# Patient Record
Sex: Female | Born: 1935
Health system: Southern US, Community
[De-identification: ages and names within clinical notes are randomized; demographics above are authoritative.]

## PROBLEM LIST (undated history)

## (undated) DIAGNOSIS — Z9221 Personal history of antineoplastic chemotherapy: Secondary | ICD-10-CM

## (undated) DIAGNOSIS — G309 Alzheimer's disease, unspecified: Secondary | ICD-10-CM

## (undated) DIAGNOSIS — R7302 Impaired glucose tolerance (oral): Secondary | ICD-10-CM

## (undated) DIAGNOSIS — C801 Malignant (primary) neoplasm, unspecified: Secondary | ICD-10-CM

## (undated) DIAGNOSIS — I10 Essential (primary) hypertension: Secondary | ICD-10-CM

## (undated) DIAGNOSIS — E785 Hyperlipidemia, unspecified: Secondary | ICD-10-CM

## (undated) DIAGNOSIS — Z853 Personal history of malignant neoplasm of breast: Secondary | ICD-10-CM

## (undated) DIAGNOSIS — D649 Anemia, unspecified: Secondary | ICD-10-CM

## (undated) DIAGNOSIS — M1990 Unspecified osteoarthritis, unspecified site: Secondary | ICD-10-CM

## (undated) DIAGNOSIS — F0280 Dementia in other diseases classified elsewhere without behavioral disturbance: Secondary | ICD-10-CM

## (undated) DIAGNOSIS — Z95828 Presence of other vascular implants and grafts: Secondary | ICD-10-CM

## (undated) HISTORY — DX: Hyperlipidemia, unspecified: E78.5

## (undated) HISTORY — DX: Impaired glucose tolerance (oral): R73.02

## (undated) HISTORY — DX: Essential (primary) hypertension: I10

## (undated) HISTORY — DX: Anemia, unspecified: D64.9

## (undated) HISTORY — DX: Unspecified osteoarthritis, unspecified site: M19.90

## (undated) HISTORY — PX: PORTACATH PLACEMENT: SHX2246

## (undated) HISTORY — DX: Malignant (primary) neoplasm, unspecified: C80.1

## (undated) HISTORY — DX: Personal history of malignant neoplasm of breast: Z85.3

## (undated) HISTORY — DX: Presence of other vascular implants and grafts: Z95.828

## (undated) HISTORY — DX: Personal history of antineoplastic chemotherapy: Z92.21

---

## 1968-11-21 HISTORY — PX: ABDOMINAL HYSTERECTOMY: SHX81

## 2002-02-12 ENCOUNTER — Emergency Department (HOSPITAL_COMMUNITY): Admission: EM | Admit: 2002-02-12 | Discharge: 2002-02-12 | Payer: Self-pay | Admitting: Emergency Medicine

## 2002-11-21 HISTORY — PX: MASTECTOMY: SHX3

## 2003-03-22 DIAGNOSIS — C801 Malignant (primary) neoplasm, unspecified: Secondary | ICD-10-CM

## 2003-03-22 HISTORY — DX: Malignant (primary) neoplasm, unspecified: C80.1

## 2003-04-28 ENCOUNTER — Encounter: Payer: Self-pay | Admitting: *Deleted

## 2003-04-28 ENCOUNTER — Emergency Department (HOSPITAL_COMMUNITY): Admission: EM | Admit: 2003-04-28 | Discharge: 2003-04-28 | Payer: Self-pay | Admitting: *Deleted

## 2003-04-30 ENCOUNTER — Ambulatory Visit (HOSPITAL_COMMUNITY): Admission: RE | Admit: 2003-04-30 | Discharge: 2003-04-30 | Payer: Self-pay | Admitting: *Deleted

## 2003-04-30 ENCOUNTER — Encounter: Payer: Self-pay | Admitting: General Surgery

## 2003-05-04 ENCOUNTER — Encounter: Payer: Self-pay | Admitting: General Surgery

## 2003-05-04 ENCOUNTER — Inpatient Hospital Stay (HOSPITAL_COMMUNITY): Admission: AD | Admit: 2003-05-04 | Discharge: 2003-05-09 | Payer: Self-pay | Admitting: General Surgery

## 2003-05-05 ENCOUNTER — Encounter: Payer: Self-pay | Admitting: General Surgery

## 2003-05-06 ENCOUNTER — Encounter: Payer: Self-pay | Admitting: General Surgery

## 2003-05-06 HISTORY — PX: BREAST SURGERY: SHX581

## 2003-06-20 ENCOUNTER — Encounter (HOSPITAL_COMMUNITY): Admission: RE | Admit: 2003-06-20 | Discharge: 2003-07-20 | Payer: Self-pay | Admitting: Hematology and Oncology

## 2003-06-20 ENCOUNTER — Encounter: Admission: RE | Admit: 2003-06-20 | Discharge: 2003-06-20 | Payer: Self-pay | Admitting: Oncology

## 2003-06-23 ENCOUNTER — Encounter (HOSPITAL_COMMUNITY): Payer: Self-pay | Admitting: Oncology

## 2003-07-07 ENCOUNTER — Encounter (HOSPITAL_COMMUNITY): Payer: Self-pay | Admitting: Oncology

## 2003-07-30 ENCOUNTER — Encounter (HOSPITAL_COMMUNITY): Admission: RE | Admit: 2003-07-30 | Discharge: 2003-08-29 | Payer: Self-pay | Admitting: Oncology

## 2003-07-30 ENCOUNTER — Encounter: Admission: RE | Admit: 2003-07-30 | Discharge: 2003-07-30 | Payer: Self-pay | Admitting: Oncology

## 2003-08-22 ENCOUNTER — Observation Stay (HOSPITAL_COMMUNITY): Admission: AD | Admit: 2003-08-22 | Discharge: 2003-08-23 | Payer: Self-pay | Admitting: Family Medicine

## 2003-08-22 ENCOUNTER — Encounter: Payer: Self-pay | Admitting: Family Medicine

## 2003-09-01 ENCOUNTER — Encounter (HOSPITAL_COMMUNITY): Payer: Self-pay | Admitting: Oncology

## 2003-09-01 ENCOUNTER — Encounter (HOSPITAL_COMMUNITY): Admission: RE | Admit: 2003-09-01 | Discharge: 2003-10-01 | Payer: Self-pay | Admitting: Oncology

## 2003-10-02 ENCOUNTER — Encounter: Admission: RE | Admit: 2003-10-02 | Discharge: 2003-10-02 | Payer: Self-pay | Admitting: Oncology

## 2003-10-02 ENCOUNTER — Encounter (HOSPITAL_COMMUNITY): Admission: RE | Admit: 2003-10-02 | Discharge: 2003-11-01 | Payer: Self-pay | Admitting: Oncology

## 2003-11-05 ENCOUNTER — Encounter: Admission: RE | Admit: 2003-11-05 | Discharge: 2003-11-05 | Payer: Self-pay | Admitting: Oncology

## 2003-11-05 ENCOUNTER — Encounter (HOSPITAL_COMMUNITY): Admission: RE | Admit: 2003-11-05 | Discharge: 2003-12-05 | Payer: Self-pay | Admitting: Oncology

## 2003-12-17 ENCOUNTER — Encounter (HOSPITAL_COMMUNITY): Admission: RE | Admit: 2003-12-17 | Discharge: 2004-01-16 | Payer: Self-pay | Admitting: Oncology

## 2003-12-17 ENCOUNTER — Encounter: Admission: RE | Admit: 2003-12-17 | Discharge: 2003-12-17 | Payer: Self-pay | Admitting: Oncology

## 2004-01-12 ENCOUNTER — Ambulatory Visit (HOSPITAL_COMMUNITY): Admission: RE | Admit: 2004-01-12 | Discharge: 2004-01-12 | Payer: Self-pay | Admitting: Oncology

## 2004-01-26 ENCOUNTER — Encounter: Admission: RE | Admit: 2004-01-26 | Discharge: 2004-01-26 | Payer: Self-pay | Admitting: Oncology

## 2004-01-26 ENCOUNTER — Encounter (HOSPITAL_COMMUNITY): Admission: RE | Admit: 2004-01-26 | Discharge: 2004-02-25 | Payer: Self-pay | Admitting: Oncology

## 2004-01-27 ENCOUNTER — Ambulatory Visit: Admission: RE | Admit: 2004-01-27 | Discharge: 2004-04-26 | Payer: Self-pay | Admitting: *Deleted

## 2004-03-03 ENCOUNTER — Encounter (HOSPITAL_COMMUNITY): Admission: RE | Admit: 2004-03-03 | Discharge: 2004-04-02 | Payer: Self-pay | Admitting: Oncology

## 2004-03-03 ENCOUNTER — Encounter: Admission: RE | Admit: 2004-03-03 | Discharge: 2004-03-03 | Payer: Self-pay | Admitting: Oncology

## 2004-04-07 ENCOUNTER — Encounter: Admission: RE | Admit: 2004-04-07 | Discharge: 2004-04-07 | Payer: Self-pay | Admitting: Oncology

## 2004-04-07 ENCOUNTER — Encounter (HOSPITAL_COMMUNITY): Admission: RE | Admit: 2004-04-07 | Discharge: 2004-05-07 | Payer: Self-pay | Admitting: Oncology

## 2004-05-06 ENCOUNTER — Ambulatory Visit (HOSPITAL_COMMUNITY): Admission: RE | Admit: 2004-05-06 | Discharge: 2004-05-06 | Payer: Self-pay | Admitting: Oncology

## 2004-05-13 ENCOUNTER — Encounter (HOSPITAL_COMMUNITY): Admission: RE | Admit: 2004-05-13 | Discharge: 2004-06-12 | Payer: Self-pay | Admitting: Oncology

## 2004-05-13 ENCOUNTER — Encounter: Admission: RE | Admit: 2004-05-13 | Discharge: 2004-05-13 | Payer: Self-pay | Admitting: Oncology

## 2004-06-16 ENCOUNTER — Encounter: Admission: RE | Admit: 2004-06-16 | Discharge: 2004-06-16 | Payer: Self-pay | Admitting: Oncology

## 2004-06-16 ENCOUNTER — Encounter (HOSPITAL_COMMUNITY): Admission: RE | Admit: 2004-06-16 | Discharge: 2004-07-16 | Payer: Self-pay | Admitting: Oncology

## 2004-07-21 ENCOUNTER — Encounter (HOSPITAL_COMMUNITY): Admission: RE | Admit: 2004-07-21 | Discharge: 2004-08-20 | Payer: Self-pay | Admitting: Oncology

## 2004-07-21 ENCOUNTER — Encounter: Admission: RE | Admit: 2004-07-21 | Discharge: 2004-08-20 | Payer: Self-pay | Admitting: Oncology

## 2004-08-02 ENCOUNTER — Ambulatory Visit (HOSPITAL_COMMUNITY): Admission: RE | Admit: 2004-08-02 | Discharge: 2004-08-02 | Payer: Self-pay | Admitting: Oncology

## 2004-08-25 ENCOUNTER — Encounter: Admission: RE | Admit: 2004-08-25 | Discharge: 2004-08-25 | Payer: Self-pay | Admitting: Oncology

## 2004-08-25 ENCOUNTER — Encounter (HOSPITAL_COMMUNITY): Admission: RE | Admit: 2004-08-25 | Discharge: 2004-09-24 | Payer: Self-pay | Admitting: Oncology

## 2004-09-30 ENCOUNTER — Ambulatory Visit (HOSPITAL_COMMUNITY): Payer: Self-pay | Admitting: Oncology

## 2004-09-30 ENCOUNTER — Encounter: Admission: RE | Admit: 2004-09-30 | Discharge: 2004-09-30 | Payer: Self-pay | Admitting: Oncology

## 2004-09-30 ENCOUNTER — Encounter (HOSPITAL_COMMUNITY): Admission: RE | Admit: 2004-09-30 | Discharge: 2004-10-30 | Payer: Self-pay | Admitting: Oncology

## 2004-11-03 ENCOUNTER — Encounter (HOSPITAL_COMMUNITY): Admission: RE | Admit: 2004-11-03 | Discharge: 2004-12-03 | Payer: Self-pay | Admitting: Oncology

## 2004-11-03 ENCOUNTER — Encounter: Admission: RE | Admit: 2004-11-03 | Discharge: 2004-11-03 | Payer: Self-pay | Admitting: Oncology

## 2004-12-02 ENCOUNTER — Ambulatory Visit (HOSPITAL_COMMUNITY): Payer: Self-pay | Admitting: Oncology

## 2004-12-09 ENCOUNTER — Encounter (HOSPITAL_COMMUNITY): Admission: RE | Admit: 2004-12-09 | Discharge: 2005-01-08 | Payer: Self-pay | Admitting: Oncology

## 2004-12-09 ENCOUNTER — Encounter: Admission: RE | Admit: 2004-12-09 | Discharge: 2004-12-09 | Payer: Self-pay | Admitting: Oncology

## 2005-02-24 ENCOUNTER — Encounter (HOSPITAL_COMMUNITY): Admission: RE | Admit: 2005-02-24 | Discharge: 2005-03-26 | Payer: Self-pay | Admitting: Oncology

## 2005-02-24 ENCOUNTER — Ambulatory Visit (HOSPITAL_COMMUNITY): Payer: Self-pay | Admitting: Oncology

## 2005-02-24 ENCOUNTER — Encounter: Admission: RE | Admit: 2005-02-24 | Discharge: 2005-02-24 | Payer: Self-pay | Admitting: Oncology

## 2005-04-07 ENCOUNTER — Encounter: Admission: RE | Admit: 2005-04-07 | Discharge: 2005-04-07 | Payer: Self-pay | Admitting: Oncology

## 2005-04-07 ENCOUNTER — Encounter (HOSPITAL_COMMUNITY): Admission: RE | Admit: 2005-04-07 | Discharge: 2005-05-07 | Payer: Self-pay | Admitting: Oncology

## 2005-05-02 ENCOUNTER — Ambulatory Visit (HOSPITAL_COMMUNITY): Admission: RE | Admit: 2005-05-02 | Discharge: 2005-05-02 | Payer: Self-pay | Admitting: Oncology

## 2005-05-19 ENCOUNTER — Encounter: Admission: RE | Admit: 2005-05-19 | Discharge: 2005-05-19 | Payer: Self-pay | Admitting: Oncology

## 2005-05-19 ENCOUNTER — Ambulatory Visit (HOSPITAL_COMMUNITY): Payer: Self-pay | Admitting: Oncology

## 2005-05-19 ENCOUNTER — Encounter (HOSPITAL_COMMUNITY): Admission: RE | Admit: 2005-05-19 | Discharge: 2005-06-18 | Payer: Self-pay | Admitting: Oncology

## 2005-05-25 ENCOUNTER — Encounter (INDEPENDENT_AMBULATORY_CARE_PROVIDER_SITE_OTHER): Payer: Self-pay | Admitting: Diagnostic Radiology

## 2005-06-07 ENCOUNTER — Ambulatory Visit (HOSPITAL_COMMUNITY): Payer: Self-pay | Admitting: Oncology

## 2005-07-01 ENCOUNTER — Encounter: Admission: RE | Admit: 2005-07-01 | Discharge: 2005-07-01 | Payer: Self-pay | Admitting: Oncology

## 2005-07-01 ENCOUNTER — Encounter (HOSPITAL_COMMUNITY): Admission: RE | Admit: 2005-07-01 | Discharge: 2005-07-31 | Payer: Self-pay | Admitting: Oncology

## 2005-08-12 ENCOUNTER — Ambulatory Visit (HOSPITAL_COMMUNITY): Payer: Self-pay | Admitting: Oncology

## 2005-08-12 ENCOUNTER — Encounter: Admission: RE | Admit: 2005-08-12 | Discharge: 2005-08-20 | Payer: Self-pay | Admitting: Oncology

## 2005-08-12 ENCOUNTER — Encounter (HOSPITAL_COMMUNITY): Admission: RE | Admit: 2005-08-12 | Discharge: 2005-08-20 | Payer: Self-pay | Admitting: Oncology

## 2005-09-23 ENCOUNTER — Encounter: Admission: RE | Admit: 2005-09-23 | Discharge: 2005-09-23 | Payer: Self-pay | Admitting: Oncology

## 2005-09-23 ENCOUNTER — Encounter (HOSPITAL_COMMUNITY): Admission: RE | Admit: 2005-09-23 | Discharge: 2005-10-23 | Payer: Self-pay | Admitting: Oncology

## 2005-11-04 ENCOUNTER — Encounter: Admission: RE | Admit: 2005-11-04 | Discharge: 2005-11-04 | Payer: Self-pay | Admitting: Oncology

## 2005-11-04 ENCOUNTER — Encounter (HOSPITAL_COMMUNITY): Admission: RE | Admit: 2005-11-04 | Discharge: 2005-11-04 | Payer: Self-pay | Admitting: Oncology

## 2005-11-04 ENCOUNTER — Ambulatory Visit (HOSPITAL_COMMUNITY): Payer: Self-pay | Admitting: Oncology

## 2005-12-16 ENCOUNTER — Encounter: Admission: RE | Admit: 2005-12-16 | Discharge: 2005-12-16 | Payer: Self-pay | Admitting: Oncology

## 2005-12-16 ENCOUNTER — Encounter (HOSPITAL_COMMUNITY): Admission: RE | Admit: 2005-12-16 | Discharge: 2006-01-15 | Payer: Self-pay | Admitting: Oncology

## 2006-01-27 ENCOUNTER — Encounter (HOSPITAL_COMMUNITY): Admission: RE | Admit: 2006-01-27 | Discharge: 2006-02-26 | Payer: Self-pay | Admitting: Oncology

## 2006-01-27 ENCOUNTER — Ambulatory Visit (HOSPITAL_COMMUNITY): Payer: Self-pay | Admitting: Oncology

## 2006-01-27 ENCOUNTER — Encounter: Admission: RE | Admit: 2006-01-27 | Discharge: 2006-01-27 | Payer: Self-pay | Admitting: Oncology

## 2006-03-24 ENCOUNTER — Ambulatory Visit (HOSPITAL_COMMUNITY): Payer: Self-pay | Admitting: Oncology

## 2006-03-24 ENCOUNTER — Encounter: Admission: RE | Admit: 2006-03-24 | Discharge: 2006-03-24 | Payer: Self-pay | Admitting: Oncology

## 2006-03-24 ENCOUNTER — Encounter (HOSPITAL_COMMUNITY): Admission: RE | Admit: 2006-03-24 | Discharge: 2006-04-23 | Payer: Self-pay | Admitting: Oncology

## 2006-05-08 ENCOUNTER — Encounter: Admission: RE | Admit: 2006-05-08 | Discharge: 2006-05-08 | Payer: Self-pay | Admitting: Oncology

## 2006-05-08 ENCOUNTER — Encounter (HOSPITAL_COMMUNITY): Admission: RE | Admit: 2006-05-08 | Discharge: 2006-06-07 | Payer: Self-pay | Admitting: Oncology

## 2006-05-29 ENCOUNTER — Ambulatory Visit (HOSPITAL_COMMUNITY): Payer: Self-pay | Admitting: Oncology

## 2006-06-19 ENCOUNTER — Encounter (HOSPITAL_COMMUNITY): Admission: RE | Admit: 2006-06-19 | Discharge: 2006-07-19 | Payer: Self-pay | Admitting: Oncology

## 2006-06-19 ENCOUNTER — Encounter: Admission: RE | Admit: 2006-06-19 | Discharge: 2006-06-19 | Payer: Self-pay | Admitting: Oncology

## 2006-07-27 ENCOUNTER — Ambulatory Visit: Payer: Self-pay | Admitting: Family Medicine

## 2006-07-31 ENCOUNTER — Encounter: Admission: RE | Admit: 2006-07-31 | Discharge: 2006-08-18 | Payer: Self-pay | Admitting: Oncology

## 2006-07-31 ENCOUNTER — Ambulatory Visit (HOSPITAL_COMMUNITY): Payer: Self-pay | Admitting: Oncology

## 2006-07-31 ENCOUNTER — Encounter (HOSPITAL_COMMUNITY): Admission: RE | Admit: 2006-07-31 | Discharge: 2006-08-18 | Payer: Self-pay | Admitting: Oncology

## 2006-08-01 ENCOUNTER — Encounter (INDEPENDENT_AMBULATORY_CARE_PROVIDER_SITE_OTHER): Payer: Self-pay | Admitting: Family Medicine

## 2006-08-24 ENCOUNTER — Ambulatory Visit: Payer: Self-pay | Admitting: Family Medicine

## 2006-09-11 ENCOUNTER — Encounter: Admission: RE | Admit: 2006-09-11 | Discharge: 2006-09-11 | Payer: Self-pay | Admitting: Oncology

## 2006-09-11 ENCOUNTER — Encounter (HOSPITAL_COMMUNITY): Admission: RE | Admit: 2006-09-11 | Discharge: 2006-10-11 | Payer: Self-pay | Admitting: Oncology

## 2006-09-14 ENCOUNTER — Ambulatory Visit: Payer: Self-pay | Admitting: Family Medicine

## 2006-09-27 DIAGNOSIS — E785 Hyperlipidemia, unspecified: Secondary | ICD-10-CM

## 2006-09-27 DIAGNOSIS — J309 Allergic rhinitis, unspecified: Secondary | ICD-10-CM | POA: Insufficient documentation

## 2006-09-27 DIAGNOSIS — M1990 Unspecified osteoarthritis, unspecified site: Secondary | ICD-10-CM

## 2006-09-27 DIAGNOSIS — C50919 Malignant neoplasm of unspecified site of unspecified female breast: Secondary | ICD-10-CM

## 2006-09-27 DIAGNOSIS — I10 Essential (primary) hypertension: Secondary | ICD-10-CM | POA: Insufficient documentation

## 2006-10-19 ENCOUNTER — Ambulatory Visit: Payer: Self-pay | Admitting: Family Medicine

## 2006-10-23 ENCOUNTER — Ambulatory Visit (HOSPITAL_COMMUNITY): Payer: Self-pay | Admitting: Oncology

## 2006-11-27 ENCOUNTER — Encounter (HOSPITAL_COMMUNITY): Admission: RE | Admit: 2006-11-27 | Discharge: 2006-12-27 | Payer: Self-pay | Admitting: Oncology

## 2006-12-14 ENCOUNTER — Ambulatory Visit: Payer: Self-pay | Admitting: Family Medicine

## 2006-12-18 ENCOUNTER — Encounter (INDEPENDENT_AMBULATORY_CARE_PROVIDER_SITE_OTHER): Payer: Self-pay | Admitting: Family Medicine

## 2007-01-08 ENCOUNTER — Ambulatory Visit (HOSPITAL_COMMUNITY): Payer: Self-pay | Admitting: Oncology

## 2007-02-07 ENCOUNTER — Ambulatory Visit: Payer: Self-pay | Admitting: Family Medicine

## 2007-02-07 LAB — CONVERTED CEMR LAB: LDL Goal: 130 mg/dL

## 2007-02-08 ENCOUNTER — Encounter (INDEPENDENT_AMBULATORY_CARE_PROVIDER_SITE_OTHER): Payer: Self-pay | Admitting: Family Medicine

## 2007-02-12 LAB — CONVERTED CEMR LAB
ALT: 17 units/L (ref 0–35)
Albumin: 4.6 g/dL (ref 3.5–5.2)
Alkaline Phosphatase: 59 units/L (ref 39–117)
BUN: 19 mg/dL (ref 6–23)
Chloride: 102 meq/L (ref 96–112)
Cholesterol: 183 mg/dL (ref 0–200)
HDL: 70 mg/dL (ref >39)
Potassium: 3.7 meq/L (ref 3.5–5.3)
Total Bilirubin: 0.5 mg/dL (ref 0.3–1.2)
Total CHOL/HDL Ratio: 2.6
Triglycerides: 84 mg/dL (ref <150)
VLDL: 17 mg/dL (ref 0–40)

## 2007-02-20 ENCOUNTER — Encounter (HOSPITAL_COMMUNITY): Admission: RE | Admit: 2007-02-20 | Discharge: 2007-03-22 | Payer: Self-pay | Admitting: Oncology

## 2007-02-21 ENCOUNTER — Encounter (INDEPENDENT_AMBULATORY_CARE_PROVIDER_SITE_OTHER): Payer: Self-pay | Admitting: Family Medicine

## 2007-02-24 ENCOUNTER — Encounter (INDEPENDENT_AMBULATORY_CARE_PROVIDER_SITE_OTHER): Payer: Self-pay | Admitting: Family Medicine

## 2007-04-03 ENCOUNTER — Ambulatory Visit (HOSPITAL_COMMUNITY): Payer: Self-pay | Admitting: Oncology

## 2007-04-10 ENCOUNTER — Encounter (INDEPENDENT_AMBULATORY_CARE_PROVIDER_SITE_OTHER): Payer: Self-pay | Admitting: Family Medicine

## 2007-05-07 ENCOUNTER — Encounter (INDEPENDENT_AMBULATORY_CARE_PROVIDER_SITE_OTHER): Payer: Self-pay | Admitting: Family Medicine

## 2007-05-08 ENCOUNTER — Encounter (INDEPENDENT_AMBULATORY_CARE_PROVIDER_SITE_OTHER): Payer: Self-pay | Admitting: Family Medicine

## 2007-05-09 ENCOUNTER — Encounter (INDEPENDENT_AMBULATORY_CARE_PROVIDER_SITE_OTHER): Payer: Self-pay | Admitting: Family Medicine

## 2007-05-15 ENCOUNTER — Encounter (INDEPENDENT_AMBULATORY_CARE_PROVIDER_SITE_OTHER): Payer: Self-pay | Admitting: Family Medicine

## 2007-05-15 ENCOUNTER — Encounter (HOSPITAL_COMMUNITY): Admission: RE | Admit: 2007-05-15 | Discharge: 2007-06-14 | Payer: Self-pay | Admitting: Oncology

## 2007-05-28 ENCOUNTER — Ambulatory Visit (HOSPITAL_COMMUNITY): Payer: Self-pay | Admitting: Oncology

## 2007-05-28 ENCOUNTER — Encounter (INDEPENDENT_AMBULATORY_CARE_PROVIDER_SITE_OTHER): Payer: Self-pay | Admitting: Family Medicine

## 2007-06-26 ENCOUNTER — Encounter (HOSPITAL_COMMUNITY): Admission: RE | Admit: 2007-06-26 | Discharge: 2007-07-26 | Payer: Self-pay | Admitting: Oncology

## 2007-06-28 ENCOUNTER — Encounter (INDEPENDENT_AMBULATORY_CARE_PROVIDER_SITE_OTHER): Payer: Self-pay | Admitting: Family Medicine

## 2007-08-01 ENCOUNTER — Encounter (INDEPENDENT_AMBULATORY_CARE_PROVIDER_SITE_OTHER): Payer: Self-pay | Admitting: Internal Medicine

## 2007-08-01 ENCOUNTER — Ambulatory Visit: Payer: Self-pay | Admitting: Family Medicine

## 2007-08-03 ENCOUNTER — Encounter (INDEPENDENT_AMBULATORY_CARE_PROVIDER_SITE_OTHER): Payer: Self-pay | Admitting: Family Medicine

## 2007-08-03 LAB — CONVERTED CEMR LAB
AST: 22 units/L (ref 0–37)
Alkaline Phosphatase: 57 units/L (ref 39–117)
BUN: 18 mg/dL (ref 6–23)
CO2: 24 meq/L (ref 19–32)
Creatinine, Ser: 1.12 mg/dL (ref 0.40–1.20)
Hemoglobin: 11.5 g/dL — ABNORMAL LOW (ref 12.0–15.0)
Lymphocytes Relative: 28 % (ref 12–46)
MCHC: 32.3 g/dL (ref 30.0–36.0)
MCV: 79.8 fL (ref 78.0–100.0)
Monocytes Absolute: 0.6 10*3/uL (ref 0.2–0.7)
Neutro Abs: 5.6 10*3/uL (ref 1.7–7.7)
RDW: 13.9 % (ref 11.5–14.0)
Total Bilirubin: 0.4 mg/dL (ref 0.3–1.2)

## 2007-08-28 ENCOUNTER — Ambulatory Visit (HOSPITAL_COMMUNITY): Payer: Self-pay | Admitting: Oncology

## 2007-08-28 ENCOUNTER — Other Ambulatory Visit (HOSPITAL_COMMUNITY): Payer: Self-pay | Admitting: Oncology

## 2007-09-03 ENCOUNTER — Encounter (INDEPENDENT_AMBULATORY_CARE_PROVIDER_SITE_OTHER): Payer: Self-pay | Admitting: Family Medicine

## 2007-09-17 ENCOUNTER — Telehealth (INDEPENDENT_AMBULATORY_CARE_PROVIDER_SITE_OTHER): Payer: Self-pay | Admitting: *Deleted

## 2007-10-03 ENCOUNTER — Ambulatory Visit: Payer: Self-pay | Admitting: Family Medicine

## 2007-10-03 DIAGNOSIS — D649 Anemia, unspecified: Secondary | ICD-10-CM

## 2007-10-04 ENCOUNTER — Telehealth (INDEPENDENT_AMBULATORY_CARE_PROVIDER_SITE_OTHER): Payer: Self-pay | Admitting: *Deleted

## 2007-10-04 LAB — CONVERTED CEMR LAB
Iron: 67 ug/dL (ref 42–145)
TIBC: 349 ug/dL (ref 250–470)

## 2007-10-05 ENCOUNTER — Encounter (INDEPENDENT_AMBULATORY_CARE_PROVIDER_SITE_OTHER): Payer: Self-pay | Admitting: Family Medicine

## 2007-11-13 ENCOUNTER — Encounter (INDEPENDENT_AMBULATORY_CARE_PROVIDER_SITE_OTHER): Payer: Self-pay | Admitting: Family Medicine

## 2007-11-26 ENCOUNTER — Encounter (INDEPENDENT_AMBULATORY_CARE_PROVIDER_SITE_OTHER): Payer: Self-pay | Admitting: Family Medicine

## 2007-11-26 ENCOUNTER — Ambulatory Visit (HOSPITAL_COMMUNITY): Payer: Self-pay | Admitting: Oncology

## 2007-11-26 ENCOUNTER — Encounter (HOSPITAL_COMMUNITY): Admission: RE | Admit: 2007-11-26 | Discharge: 2007-12-26 | Payer: Self-pay | Admitting: Oncology

## 2007-11-29 ENCOUNTER — Encounter (INDEPENDENT_AMBULATORY_CARE_PROVIDER_SITE_OTHER): Payer: Self-pay | Admitting: Family Medicine

## 2007-12-24 ENCOUNTER — Encounter (INDEPENDENT_AMBULATORY_CARE_PROVIDER_SITE_OTHER): Payer: Self-pay | Admitting: Family Medicine

## 2008-01-22 ENCOUNTER — Encounter (INDEPENDENT_AMBULATORY_CARE_PROVIDER_SITE_OTHER): Payer: Self-pay | Admitting: Family Medicine

## 2008-01-25 ENCOUNTER — Encounter (INDEPENDENT_AMBULATORY_CARE_PROVIDER_SITE_OTHER): Payer: Self-pay | Admitting: Family Medicine

## 2008-01-29 ENCOUNTER — Ambulatory Visit: Payer: Self-pay | Admitting: Family Medicine

## 2008-01-29 ENCOUNTER — Ambulatory Visit (HOSPITAL_COMMUNITY): Payer: Self-pay | Admitting: Oncology

## 2008-01-29 ENCOUNTER — Encounter (HOSPITAL_COMMUNITY): Admission: RE | Admit: 2008-01-29 | Discharge: 2008-02-28 | Payer: Self-pay | Admitting: Oncology

## 2008-01-31 ENCOUNTER — Encounter (INDEPENDENT_AMBULATORY_CARE_PROVIDER_SITE_OTHER): Payer: Self-pay | Admitting: Family Medicine

## 2008-03-24 ENCOUNTER — Encounter (INDEPENDENT_AMBULATORY_CARE_PROVIDER_SITE_OTHER): Payer: Self-pay | Admitting: Family Medicine

## 2008-04-01 ENCOUNTER — Ambulatory Visit (HOSPITAL_COMMUNITY): Payer: Self-pay | Admitting: Oncology

## 2008-04-01 ENCOUNTER — Ambulatory Visit: Payer: Self-pay | Admitting: Family Medicine

## 2008-04-01 DIAGNOSIS — L259 Unspecified contact dermatitis, unspecified cause: Secondary | ICD-10-CM

## 2008-04-02 ENCOUNTER — Encounter (INDEPENDENT_AMBULATORY_CARE_PROVIDER_SITE_OTHER): Payer: Self-pay | Admitting: Family Medicine

## 2008-04-02 ENCOUNTER — Telehealth (INDEPENDENT_AMBULATORY_CARE_PROVIDER_SITE_OTHER): Payer: Self-pay | Admitting: *Deleted

## 2008-04-02 LAB — CONVERTED CEMR LAB
Alkaline Phosphatase: 58 units/L (ref 39–117)
Basophils Relative: 1 % (ref 0–1)
Calcium: 9.4 mg/dL (ref 8.4–10.5)
Chloride: 101 meq/L (ref 96–112)
Cholesterol: 215 mg/dL — ABNORMAL HIGH (ref 0–200)
Creatinine, Ser: 1.1 mg/dL (ref 0.40–1.20)
Eosinophils Relative: 2 % (ref 0–5)
Glucose, Bld: 102 mg/dL — ABNORMAL HIGH (ref 70–99)
HCT: 36.2 % (ref 36.0–46.0)
HDL: 64 mg/dL (ref >39)
MCHC: 31.8 g/dL (ref 30.0–36.0)
MCV: 81 fL (ref 78.0–100.0)
Monocytes Absolute: 0.5 10*3/uL (ref 0.1–1.0)
Monocytes Relative: 6 % (ref 3–12)
Neutrophils Relative %: 59 % (ref 43–77)
RBC: 4.47 M/uL (ref 3.87–5.11)
RDW: 15.1 % (ref 11.5–15.5)
Sodium: 140 meq/L (ref 135–145)
Total Protein: 7.6 g/dL (ref 6.0–8.3)
VLDL: 31 mg/dL (ref 0–40)
WBC: 8.2 10*3/uL (ref 4.0–10.5)

## 2008-04-17 ENCOUNTER — Ambulatory Visit: Payer: Self-pay | Admitting: Family Medicine

## 2008-04-17 LAB — CONVERTED CEMR LAB
Bilirubin Urine: NEGATIVE
Glucose, Urine, Semiquant: NEGATIVE
Ketones, urine, test strip: NEGATIVE
Urobilinogen, UA: 1
pH: 7

## 2008-04-21 ENCOUNTER — Telehealth (INDEPENDENT_AMBULATORY_CARE_PROVIDER_SITE_OTHER): Payer: Self-pay | Admitting: Internal Medicine

## 2008-04-23 ENCOUNTER — Ambulatory Visit: Payer: Self-pay | Admitting: Family Medicine

## 2008-04-23 ENCOUNTER — Encounter (INDEPENDENT_AMBULATORY_CARE_PROVIDER_SITE_OTHER): Payer: Self-pay | Admitting: Family Medicine

## 2008-04-23 ENCOUNTER — Other Ambulatory Visit: Admission: RE | Admit: 2008-04-23 | Discharge: 2008-04-23 | Payer: Self-pay | Admitting: Family Medicine

## 2008-04-23 ENCOUNTER — Emergency Department (HOSPITAL_COMMUNITY): Admission: EM | Admit: 2008-04-23 | Discharge: 2008-04-23 | Payer: Self-pay | Admitting: Emergency Medicine

## 2008-04-23 LAB — CONVERTED CEMR LAB
Basophils Relative: 0 % (ref 0–1)
Hemoglobin: 10.6 g/dL — ABNORMAL LOW (ref 12.0–15.0)
Lymphs Abs: 1.3 10*3/uL (ref 0.7–4.0)
MCHC: 33.8 g/dL (ref 30.0–36.0)
Monocytes Relative: 5 % (ref 3–12)
Nitrite: NEGATIVE
RBC: 3.91 M/uL (ref 3.87–5.11)
RDW: 14.2 % (ref 11.5–15.5)
WBC: 10.6 10*3/uL — ABNORMAL HIGH (ref 4.0–10.5)
pH: 6.5

## 2008-04-24 ENCOUNTER — Encounter (INDEPENDENT_AMBULATORY_CARE_PROVIDER_SITE_OTHER): Payer: Self-pay | Admitting: Family Medicine

## 2008-04-24 LAB — CONVERTED CEMR LAB
Chlamydia, DNA Probe: NEGATIVE
GC Probe Amp, Genital: NEGATIVE
Gardnerella vaginalis: POSITIVE — AB

## 2008-04-25 ENCOUNTER — Ambulatory Visit: Payer: Self-pay | Admitting: Family Medicine

## 2008-04-26 ENCOUNTER — Encounter (INDEPENDENT_AMBULATORY_CARE_PROVIDER_SITE_OTHER): Payer: Self-pay | Admitting: Internal Medicine

## 2008-04-26 ENCOUNTER — Observation Stay (HOSPITAL_COMMUNITY): Admission: EM | Admit: 2008-04-26 | Discharge: 2008-04-27 | Payer: Self-pay | Admitting: Emergency Medicine

## 2008-04-30 LAB — CONVERTED CEMR LAB
Iron: 78 ug/dL (ref 42–145)
TIBC: 327 ug/dL (ref 250–470)

## 2008-05-02 ENCOUNTER — Ambulatory Visit: Payer: Self-pay | Admitting: Family Medicine

## 2008-05-02 DIAGNOSIS — R011 Cardiac murmur, unspecified: Secondary | ICD-10-CM

## 2008-05-02 DIAGNOSIS — I6529 Occlusion and stenosis of unspecified carotid artery: Secondary | ICD-10-CM

## 2008-05-02 DIAGNOSIS — R55 Syncope and collapse: Secondary | ICD-10-CM

## 2008-05-02 DIAGNOSIS — A6000 Herpesviral infection of urogenital system, unspecified: Secondary | ICD-10-CM | POA: Insufficient documentation

## 2008-05-09 ENCOUNTER — Encounter (INDEPENDENT_AMBULATORY_CARE_PROVIDER_SITE_OTHER): Payer: Self-pay | Admitting: Family Medicine

## 2008-05-09 ENCOUNTER — Ambulatory Visit: Payer: Self-pay | Admitting: Internal Medicine

## 2008-05-09 ENCOUNTER — Ambulatory Visit (HOSPITAL_COMMUNITY): Admission: RE | Admit: 2008-05-09 | Discharge: 2008-05-09 | Payer: Self-pay | Admitting: Family Medicine

## 2008-05-26 ENCOUNTER — Ambulatory Visit: Payer: Self-pay | Admitting: Surgery

## 2008-06-13 ENCOUNTER — Encounter (INDEPENDENT_AMBULATORY_CARE_PROVIDER_SITE_OTHER): Payer: Self-pay | Admitting: Family Medicine

## 2008-06-19 ENCOUNTER — Encounter (INDEPENDENT_AMBULATORY_CARE_PROVIDER_SITE_OTHER): Payer: Self-pay | Admitting: Family Medicine

## 2008-06-19 ENCOUNTER — Encounter (HOSPITAL_COMMUNITY): Admission: RE | Admit: 2008-06-19 | Discharge: 2008-07-19 | Payer: Self-pay | Admitting: Oncology

## 2008-06-19 ENCOUNTER — Ambulatory Visit (HOSPITAL_COMMUNITY): Payer: Self-pay | Admitting: Oncology

## 2008-07-11 ENCOUNTER — Encounter (INDEPENDENT_AMBULATORY_CARE_PROVIDER_SITE_OTHER): Payer: Self-pay | Admitting: Family Medicine

## 2008-07-17 ENCOUNTER — Ambulatory Visit: Payer: Self-pay | Admitting: Family Medicine

## 2008-08-05 ENCOUNTER — Emergency Department (HOSPITAL_COMMUNITY): Admission: EM | Admit: 2008-08-05 | Discharge: 2008-08-05 | Payer: Self-pay | Admitting: Emergency Medicine

## 2008-08-15 ENCOUNTER — Ambulatory Visit: Payer: Self-pay | Admitting: Family Medicine

## 2008-08-15 ENCOUNTER — Ambulatory Visit (HOSPITAL_COMMUNITY): Payer: Self-pay | Admitting: Oncology

## 2008-08-18 ENCOUNTER — Encounter (INDEPENDENT_AMBULATORY_CARE_PROVIDER_SITE_OTHER): Payer: Self-pay | Admitting: Family Medicine

## 2008-08-19 LAB — CONVERTED CEMR LAB
ALT: 12 units/L (ref 0–35)
AST: 18 units/L (ref 0–37)
Albumin: 4.6 g/dL (ref 3.5–5.2)
CO2: 20 meq/L (ref 19–32)
Calcium: 9.3 mg/dL (ref 8.4–10.5)
Cholesterol: 215 mg/dL — ABNORMAL HIGH (ref 0–200)
HDL: 60 mg/dL (ref >39)
Potassium: 4.3 meq/L (ref 3.5–5.3)
Sodium: 140 meq/L (ref 135–145)
Total Bilirubin: 0.6 mg/dL (ref 0.3–1.2)
Total Protein: 7.6 g/dL (ref 6.0–8.3)
Triglycerides: 157 mg/dL — ABNORMAL HIGH (ref <150)

## 2008-08-21 ENCOUNTER — Ambulatory Visit: Payer: Self-pay | Admitting: Family Medicine

## 2008-09-01 ENCOUNTER — Telehealth (INDEPENDENT_AMBULATORY_CARE_PROVIDER_SITE_OTHER): Payer: Self-pay | Admitting: *Deleted

## 2008-09-03 ENCOUNTER — Ambulatory Visit: Payer: Self-pay | Admitting: Family Medicine

## 2008-09-06 ENCOUNTER — Encounter (INDEPENDENT_AMBULATORY_CARE_PROVIDER_SITE_OTHER): Payer: Self-pay | Admitting: Family Medicine

## 2008-09-16 ENCOUNTER — Ambulatory Visit: Payer: Self-pay | Admitting: Cardiology

## 2008-09-26 ENCOUNTER — Encounter (HOSPITAL_COMMUNITY): Admission: RE | Admit: 2008-09-26 | Discharge: 2008-10-26 | Payer: Self-pay | Admitting: Oncology

## 2008-09-26 ENCOUNTER — Ambulatory Visit: Payer: Self-pay | Admitting: Family Medicine

## 2008-09-27 ENCOUNTER — Encounter (INDEPENDENT_AMBULATORY_CARE_PROVIDER_SITE_OTHER): Payer: Self-pay | Admitting: Family Medicine

## 2008-09-29 LAB — CONVERTED CEMR LAB
Albumin: 4.8 g/dL (ref 3.5–5.2)
CO2: 22 meq/L (ref 19–32)
Calcium: 10.1 mg/dL (ref 8.4–10.5)
Glucose, Bld: 101 mg/dL — ABNORMAL HIGH (ref 70–99)
Potassium: 3.7 meq/L (ref 3.5–5.3)
Sodium: 138 meq/L (ref 135–145)
Total Protein: 8 g/dL (ref 6.0–8.3)

## 2008-11-07 ENCOUNTER — Ambulatory Visit (HOSPITAL_COMMUNITY): Payer: Self-pay | Admitting: Oncology

## 2008-12-12 ENCOUNTER — Encounter (INDEPENDENT_AMBULATORY_CARE_PROVIDER_SITE_OTHER): Payer: Self-pay | Admitting: Family Medicine

## 2009-01-12 ENCOUNTER — Encounter (INDEPENDENT_AMBULATORY_CARE_PROVIDER_SITE_OTHER): Payer: Self-pay | Admitting: Family Medicine

## 2009-01-23 ENCOUNTER — Ambulatory Visit (HOSPITAL_COMMUNITY): Payer: Self-pay | Admitting: Oncology

## 2009-01-30 ENCOUNTER — Ambulatory Visit: Payer: Self-pay | Admitting: Family Medicine

## 2009-01-30 ENCOUNTER — Telehealth (INDEPENDENT_AMBULATORY_CARE_PROVIDER_SITE_OTHER): Payer: Self-pay | Admitting: *Deleted

## 2009-02-02 ENCOUNTER — Encounter (INDEPENDENT_AMBULATORY_CARE_PROVIDER_SITE_OTHER): Payer: Self-pay | Admitting: *Deleted

## 2009-02-02 LAB — CONVERTED CEMR LAB
ALT: 12 units/L (ref 0–35)
AST: 19 units/L (ref 0–37)
Albumin: 4.4 g/dL (ref 3.5–5.2)
Alkaline Phosphatase: 48 units/L (ref 39–117)
Basophils Relative: 1 % (ref 0–1)
Chloride: 100 meq/L (ref 96–112)
Cholesterol: 200 mg/dL (ref 0–200)
Eosinophils Absolute: 0.1 10*3/uL (ref 0.0–0.7)
Glucose, Bld: 91 mg/dL (ref 70–99)
HCT: 36.2 % (ref 36.0–46.0)
Hemoglobin: 11.3 g/dL — ABNORMAL LOW (ref 12.0–15.0)
MCHC: 31.2 g/dL (ref 30.0–36.0)
Neutrophils Relative %: 53 % (ref 43–77)
Platelets: 341 10*3/uL (ref 150–400)
Potassium: 4 meq/L (ref 3.5–5.3)
RBC: 4.45 M/uL (ref 3.87–5.11)
Total Protein: 7.7 g/dL (ref 6.0–8.3)
Triglycerides: 150 mg/dL — ABNORMAL HIGH (ref <150)
WBC: 6 10*3/uL (ref 4.0–10.5)

## 2009-02-10 ENCOUNTER — Encounter (INDEPENDENT_AMBULATORY_CARE_PROVIDER_SITE_OTHER): Payer: Self-pay | Admitting: Family Medicine

## 2009-02-16 ENCOUNTER — Encounter (INDEPENDENT_AMBULATORY_CARE_PROVIDER_SITE_OTHER): Payer: Self-pay | Admitting: Family Medicine

## 2009-02-17 ENCOUNTER — Ambulatory Visit: Payer: Self-pay | Admitting: Family Medicine

## 2009-03-06 ENCOUNTER — Ambulatory Visit: Payer: Self-pay | Admitting: Family Medicine

## 2009-04-17 ENCOUNTER — Ambulatory Visit (HOSPITAL_COMMUNITY): Payer: Self-pay | Admitting: Oncology

## 2009-04-17 ENCOUNTER — Encounter (HOSPITAL_COMMUNITY): Admission: RE | Admit: 2009-04-17 | Discharge: 2009-05-17 | Payer: Self-pay | Admitting: Oncology

## 2009-06-26 ENCOUNTER — Encounter (INDEPENDENT_AMBULATORY_CARE_PROVIDER_SITE_OTHER): Payer: Self-pay | Admitting: Family Medicine

## 2009-06-26 ENCOUNTER — Ambulatory Visit (HOSPITAL_COMMUNITY): Payer: Self-pay | Admitting: Oncology

## 2009-06-26 ENCOUNTER — Encounter (HOSPITAL_COMMUNITY): Admission: RE | Admit: 2009-06-26 | Discharge: 2009-07-26 | Payer: Self-pay | Admitting: Oncology

## 2009-07-14 ENCOUNTER — Encounter (INDEPENDENT_AMBULATORY_CARE_PROVIDER_SITE_OTHER): Payer: Self-pay | Admitting: Family Medicine

## 2009-08-28 ENCOUNTER — Ambulatory Visit: Payer: Self-pay | Admitting: Family Medicine

## 2009-09-02 ENCOUNTER — Encounter: Payer: Self-pay | Admitting: Family Medicine

## 2009-09-04 LAB — CONVERTED CEMR LAB
Albumin: 4.7 g/dL (ref 3.5–5.2)
BUN: 21 mg/dL (ref 6–23)
CO2: 24 meq/L (ref 19–32)
Calcium: 9.9 mg/dL (ref 8.4–10.5)
Creatinine, Ser: 1.19 mg/dL (ref 0.40–1.20)
Glucose, Bld: 105 mg/dL — ABNORMAL HIGH (ref 70–99)
HDL: 59 mg/dL (ref >39)
Potassium: 4.4 meq/L (ref 3.5–5.3)
VLDL: 33 mg/dL (ref 0–40)

## 2009-10-09 ENCOUNTER — Encounter (HOSPITAL_COMMUNITY): Admission: RE | Admit: 2009-10-09 | Discharge: 2009-11-08 | Payer: Self-pay | Admitting: Oncology

## 2009-10-09 ENCOUNTER — Ambulatory Visit (HOSPITAL_COMMUNITY): Payer: Self-pay | Admitting: Oncology

## 2009-12-18 ENCOUNTER — Ambulatory Visit (HOSPITAL_COMMUNITY): Payer: Self-pay | Admitting: Oncology

## 2009-12-18 ENCOUNTER — Encounter (HOSPITAL_COMMUNITY): Admission: RE | Admit: 2009-12-18 | Discharge: 2010-01-17 | Payer: Self-pay | Admitting: Oncology

## 2010-01-01 ENCOUNTER — Ambulatory Visit: Payer: Self-pay | Admitting: Family Medicine

## 2010-01-01 DIAGNOSIS — R5383 Other fatigue: Secondary | ICD-10-CM

## 2010-01-01 DIAGNOSIS — R5381 Other malaise: Secondary | ICD-10-CM | POA: Insufficient documentation

## 2010-02-19 ENCOUNTER — Encounter: Payer: Self-pay | Admitting: Family Medicine

## 2010-02-19 ENCOUNTER — Ambulatory Visit (HOSPITAL_COMMUNITY): Payer: Self-pay | Admitting: Oncology

## 2010-04-10 ENCOUNTER — Emergency Department (HOSPITAL_COMMUNITY): Admission: EM | Admit: 2010-04-10 | Discharge: 2010-04-11 | Payer: Self-pay | Admitting: Emergency Medicine

## 2010-04-16 ENCOUNTER — Ambulatory Visit: Payer: Self-pay | Admitting: Family Medicine

## 2010-04-16 DIAGNOSIS — J45991 Cough variant asthma: Secondary | ICD-10-CM

## 2010-04-16 DIAGNOSIS — B360 Pityriasis versicolor: Secondary | ICD-10-CM | POA: Insufficient documentation

## 2010-04-20 LAB — CONVERTED CEMR LAB
ALT: 11 units/L (ref 0–35)
Alkaline Phosphatase: 39 units/L (ref 39–117)
BUN: 29 mg/dL — ABNORMAL HIGH (ref 6–23)
Cholesterol: 177 mg/dL (ref 0–200)
Creatinine, Ser: 1.51 mg/dL — ABNORMAL HIGH (ref 0.40–1.20)
Indirect Bilirubin: 0.5 mg/dL (ref 0.0–0.9)
LDL Cholesterol: 98 mg/dL (ref 0–99)
Total Protein: 7.5 g/dL (ref 6.0–8.3)
Triglycerides: 158 mg/dL — ABNORMAL HIGH (ref <150)

## 2010-05-14 ENCOUNTER — Ambulatory Visit (HOSPITAL_COMMUNITY): Payer: Self-pay | Admitting: Oncology

## 2010-06-17 ENCOUNTER — Telehealth (INDEPENDENT_AMBULATORY_CARE_PROVIDER_SITE_OTHER): Payer: Self-pay | Admitting: *Deleted

## 2010-06-22 ENCOUNTER — Ambulatory Visit: Payer: Self-pay | Admitting: Family Medicine

## 2010-08-06 ENCOUNTER — Ambulatory Visit (HOSPITAL_COMMUNITY): Payer: Self-pay | Admitting: Oncology

## 2010-08-27 ENCOUNTER — Ambulatory Visit: Payer: Self-pay | Admitting: Family Medicine

## 2010-08-30 ENCOUNTER — Encounter: Payer: Self-pay | Admitting: Family Medicine

## 2010-09-07 ENCOUNTER — Encounter: Payer: Self-pay | Admitting: Physician Assistant

## 2010-09-10 LAB — CONVERTED CEMR LAB
ALT: 18 units/L (ref 0–35)
BUN: 16 mg/dL (ref 6–23)
CO2: 25 meq/L (ref 19–32)
Cholesterol: 204 mg/dL — ABNORMAL HIGH (ref 0–200)
Creatinine, Ser: 1.18 mg/dL (ref 0.40–1.20)
HCT: 34.7 % — ABNORMAL LOW (ref 36.0–46.0)
HDL: 61 mg/dL (ref >39)
Hemoglobin: 11 g/dL — ABNORMAL LOW (ref 12.0–15.0)
MCV: 81.5 fL (ref 78.0–100.0)
Platelets: 299 10*3/uL (ref 150–400)
Total Bilirubin: 0.6 mg/dL (ref 0.3–1.2)
Total CHOL/HDL Ratio: 3.3
Triglycerides: 181 mg/dL — ABNORMAL HIGH (ref <150)
VLDL: 36 mg/dL (ref 0–40)
WBC: 9.2 10*3/uL (ref 4.0–10.5)

## 2010-10-28 ENCOUNTER — Encounter: Payer: Self-pay | Admitting: Family Medicine

## 2010-10-29 ENCOUNTER — Encounter (HOSPITAL_COMMUNITY)
Admission: RE | Admit: 2010-10-29 | Discharge: 2010-11-28 | Payer: Self-pay | Source: Home / Self Care | Attending: Oncology | Admitting: Oncology

## 2010-11-05 ENCOUNTER — Ambulatory Visit (HOSPITAL_COMMUNITY): Payer: Self-pay | Admitting: Oncology

## 2010-12-09 ENCOUNTER — Ambulatory Visit
Admission: RE | Admit: 2010-12-09 | Discharge: 2010-12-09 | Payer: Self-pay | Source: Home / Self Care | Attending: Family Medicine | Admitting: Family Medicine

## 2010-12-09 ENCOUNTER — Encounter: Payer: Self-pay | Admitting: Family Medicine

## 2010-12-12 ENCOUNTER — Encounter (HOSPITAL_COMMUNITY): Payer: Self-pay | Admitting: Oncology

## 2010-12-12 ENCOUNTER — Encounter: Payer: Self-pay | Admitting: Internal Medicine

## 2010-12-17 ENCOUNTER — Encounter (HOSPITAL_COMMUNITY)
Admission: RE | Admit: 2010-12-17 | Discharge: 2010-12-21 | Payer: Self-pay | Source: Home / Self Care | Attending: Oncology | Admitting: Oncology

## 2010-12-19 LAB — CONVERTED CEMR LAB
AST: 18 units/L (ref 0–37)
Bilirubin, Direct: 0.1 mg/dL (ref 0.0–0.3)
Blood Glucose, Fasting: 118 mg/dL
Ketones, urine, test strip: NEGATIVE
Nitrite: NEGATIVE
Specific Gravity, Urine: 1.015
Urobilinogen, UA: 0.2
pH: 6.5

## 2010-12-21 NOTE — Assessment & Plan Note (Signed)
Summary: office visit   Vital Signs:  Patient profile:   77 year old female Menstrual status:  hysterectomy Height:      63 inches Weight:      161.25 pounds BMI:     28.67 O2 Sat:      98 % Pulse rate:   89 / minute Pulse rhythm:   regular Resp:     16 per minute BP sitting:   130 / 62 Cuff size:   large  Vitals Entered By: Everitt Amber (January 01, 2010 9:19 AM)  Nutrition Counseling: Patient's BMI is greater than 25 and therefore counseled on weight management options. CC: Follow up chrobic problems Is Patient Diabetic? No   Primary Care Provider:  Syliva Overman MD  CC:  Follow up chrobic problems.  History of Present Illness: 1 week h/o head and chest congestion, no fever or chills, sputun and drainage is scant and clear. Reports  that she has generally been doingwell. Denies recent fever or chills. Denies sinus pressure, nasal congestion , ear pain or sore throat. Denies chest congestion, or cough productive of sputum. Denies chest pain, palpitations, PND, orthopnea or leg swelling. Denies abdominal pain, nausea, vomitting, diarrhea or constipation. Denies change in bowel movements or bloody stool. Denies dysuria , frequency, incontinence or hesitancy. Denies  joint pain, swelling, or reduced mobility. Denies headaches, vertigo, seizures. Denies depression, anxiety or insomnia. Denies  rash, lesions, or itch. Thye pt is still refusing on cancer screening at this time     Current Medications (verified): 1)  Aspirin 81 Mg Tbec (Aspirin) .Marland Kitchen.. 1 By Mouth Once Daily 2)  Centrum Silver  Tabs (Multiple Vitamins-Minerals) .... One Daily 3)  Crestor 20 Mg Tabs (Rosuvastatin Calcium) .... Take 1 Tab By Mouth At Bedtime 4)  Losartan Potassium-Hctz 100-25 Mg Tabs (Losartan Potassium-Hctz) .... Take 1 Tablet By Mouth Once A Day 5)  Amlodipine Besylate 10 Mg Tabs (Amlodipine Besylate) .... Take 1 Tablet By Mouth Once A Day 6)  Hydralazine Hcl 10 Mg Tabs (Hydralazine Hcl)  .... Take 1 Tablet By Mouth Once A Day  Allergies (verified): 1)  ! Nystop (Nystatin)  Review of Systems      See HPI General:  Complains of fatigue. Eyes:  Denies blurring and discharge. Endo:  Denies cold intolerance, excessive hunger, excessive thirst, excessive urination, heat intolerance, polyuria, and weight change. Heme:  Denies abnormal bruising and bleeding. Allergy:  Denies hives or rash and sneezing.  Physical Exam  General:  alert, well-hydrated, and overweight-appearing.  HEENT: No facial asymmetry,  EOMI, No sinus tenderness, TM's Clear, oropharynx  pink and moist.   Chest: Clear to auscultation bilaterally.  CVS: S1, S2, No murmurs, No S3.   Abd: Soft, Nontender.  MS: Adequate ROM spine, hips,  and knees. Decreased rOM left shoulder. Ext: No edema.   CNS: CN 2-12 intact, power tone and sensation normal throughout.   Skin: Intact, no visible lesions or rashes.  Psych: Good eye contact, normal affect.  Memory intact, not anxious or depressed appearing.     Impression & Recommendations:  Problem # 1:  FATIGUE (ICD-780.79) Assessment Comment Only  Orders: T-CBC w/Diff ([id number]) T-TSH ([id number])  Problem # 2:  OSTEOARTHRITIS (ICD-715.90) Assessment: Improved  The following medications were removed from the medication list:    Mobic 7.5 Mg/68ml Susp (Meloxicam) .Marland Kitchen... Take 1 tablet by mouth once a day as needed Her updated medication list for this problem includes:    Aspirin 81 Mg Tbec (Aspirin) .Marland Kitchen... 1 by  mouth once daily  Problem # 3:  HYPERTENSION (ICD-401.9) Assessment: Unchanged  Her updated medication list for this problem includes:    Losartan Potassium-hctz 100-25 Mg Tabs (Losartan potassium-hctz) .Marland Kitchen... Take 1 tablet by mouth once a day    Amlodipine Besylate 10 Mg Tabs (Amlodipine besylate) .Marland Kitchen... Take 1 tablet by mouth once a day    Hydralazine Hcl 10 Mg Tabs (Hydralazine hcl) .Marland Kitchen... Take 1 tablet by mouth once a day  Orders: T-Basic  Metabolic Panel [id number])  BP today: 130/62 Prior BP: 134/66 (08/28/2009)  Prior 10 Yr Risk Heart Disease: 9 % (03/06/2009)  Labs Reviewed: K+: 4.4 (08/28/2009) Creat: : 1.19 (08/28/2009)   Chol: 208 (08/28/2009)   HDL: 59 (08/28/2009)   LDL: 116 (08/28/2009)   TG: 164 (08/28/2009)  Problem # 4:  HYPERLIPIDEMIA (ICD-272.4) Assessment: Comment Only  Her updated medication list for this problem includes:    Crestor 20 Mg Tabs (Rosuvastatin calcium) .Marland Kitchen... Take 1 tab by mouth at bedtime  Orders: T-Hepatic Function [id number]) T-Lipid Profile [id number])  Labs Reviewed: SGOT: 20 (08/28/2009)   SGPT: 14 (08/28/2009)  Lipid Goals: Chol Goal: 200 (02/07/2007)   HDL Goal: 40 (02/07/2007)   LDL Goal: 100 (08/15/2008)   TG Goal: 150 (02/07/2007)  Prior 10 Yr Risk Heart Disease: 9 % (03/06/2009)   HDL:59 (08/28/2009), 56 (01/30/2009)  LDL:116 (08/28/2009), 114 (01/30/2009)  Chol:208 (08/28/2009), 200 (01/30/2009)  Trig:164 (08/28/2009), 150 (01/30/2009)  Complete Medication List: 1)  Aspirin 81 Mg Tbec (Aspirin) .Marland Kitchen.. 1 by mouth once daily 2)  Centrum Silver Tabs (Multiple vitamins-minerals) .... One daily 3)  Crestor 20 Mg Tabs (Rosuvastatin calcium) .... Take 1 tab by mouth at bedtime 4)  Losartan Potassium-hctz 100-25 Mg Tabs (Losartan potassium-hctz) .... Take 1 tablet by mouth once a day 5)  Amlodipine Besylate 10 Mg Tabs (Amlodipine besylate) .... Take 1 tablet by mouth once a day 6)  Hydralazine Hcl 10 Mg Tabs (Hydralazine hcl) .... Take 1 tablet by mouth once a day 7)  Tessalon Perles 100 Mg Caps (Benzonatate) .... Take 1 capsule by mouth three times a day  Patient Instructions: 1)  Please schedule a follow-up appointment in 4.5 months. 2)  You are being treated allergies causing sinusitis and bronchitis, meds are sent in. 3)  BMP prior to visit, ICD-9: 4)  Hepatic Panel prior to visit, ICD-9: 5)  Lipid Panel prior to visit, ICD-9: 6)  TSH prior to visit, ICD-9:    fasting in April 7)  CBC w/ Diff prior to visit, ICD-9: Prescriptions: TESSALON PERLES 100 MG CAPS (BENZONATATE) Take 1 capsule by mouth three times a day  #30 x 0   Entered and Authorized by:   Syliva Overman MD   Signed by:   Syliva Overman MD on 01/01/2010   Method used:   Electronically to        [facility]* (retail)       [address]       [place], [place]  [address]       Ph: [phone] or [phone]       Fax: [phone]   RxID:   [id number]

## 2010-12-21 NOTE — Progress Notes (Signed)
Summary: INJECTION  Phone Note Call from Patient   Summary of Call: Emma Dawson FOR SINSUS CALL BACK AT [phone] Initial call taken by: Lind Guest,  June 17, 2010 8:23 AM  Follow-up for Phone Call        please have patient schedule appt with dawn Follow-up by: Adella Hare LPN,  June 17, 2010 10:31 AM  Additional Follow-up for Phone Call Additional follow up Details #1::        CAN NOT COME IN UNTIL TUESDAY  Additional Follow-up by: Lind Guest,  June 17, 2010 1:48 PM

## 2010-12-21 NOTE — Assessment & Plan Note (Signed)
Summary: office visit   Vital Signs:  Patient profile:   77 year old female Menstrual status:  hysterectomy Height:      63 inches Weight:      154 pounds BMI:     27.38 O2 Sat:      97 % Pulse rate:   68 / minute Pulse rhythm:   regular Resp:     16 per minute BP sitting:   136 / 64  (left arm) Cuff size:   regular  Vitals Entered By: Everitt Amber LPN (Apr 16, 2010 7:59 AM)  Nutrition Counseling: Patient's BMI is greater than 25 and therefore counseled on weight management options. CC: Follow up, went to er saturday night for cough and wheezing. Chest xray showed clear lungs. Was given an inhaler   Primary Care Provider:  Syliva Overman MD  CC:  Follow up and went to er saturday night for cough and wheezing. Chest xray showed clear lungs. Was given an inhaler.  History of Present Illness: Ptrecently trearted in the ed for cough variant asthma, staes she is doing well using albuteol as needed, reports Spring allergies chronically. She deneies recentor current fever or chils. her nsal drainage is clear and she has no sputum when she coughs. she deneies depression or anxiety. Shedenies chest pain , plpitations, leg edema , pND or rorthopnea. She still refuses screening tests for cancer. he keeps active in her garden and explains hwer weight loss by the fact that she remains active and primarily eats veg and fruit She denies constipation, abd pain , visible rectal blood or a change in BM.  Current Medications (verified): 1)  Aspirin 81 Mg Tbec (Aspirin) .Marland Kitchen.. 1 By Mouth Once Daily 2)  Centrum Silver  Tabs (Multiple Vitamins-Minerals) .... One Daily 3)  Crestor 20 Mg Tabs (Rosuvastatin Calcium) .... Take 1 Tab By Mouth At Bedtime 4)  Losartan Potassium-Hctz 100-25 Mg Tabs (Losartan Potassium-Hctz) .... Take 1 Tablet By Mouth Once A Day 5)  Amlodipine Besylate 10 Mg Tabs (Amlodipine Besylate) .... Take 1 Tablet By Mouth Once A Day 6)  Hydralazine Hcl 10 Mg Tabs (Hydralazine Hcl)  .... Take 1 Tablet By Mouth Once A Day 7)  Proair Hfa 108 (90 Base) Mcg/act Aers (Albuterol Sulfate) .... 2 Puffs Every 4 Hrs  Allergies (verified): 1)  ! 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 Eyes:  Denies blurring and discharge. Resp:  Complains of cough, shortness of breath, and wheezing; denies sputum productive; cvoughing primarily at night, no fever or chills or sputum , got proair in the Ed uses at night and is better. GU:  Denies dysuria, incontinence, urinary frequency, and urinary hesitancy. MS:  Complains of joint pain and stiffness; denies low back pain, mid back pain, muscle, and muscle weakness. Derm:  Complains of itching and rash; puritic rash on right leg x 4 months, olive oil only. Neuro:  Denies headaches, numbness, seizures, and sensation of room spinning. Psych:  Denies anxiety and depression. Endo:  Denies excessive thirst, excessive urination, and heat intolerance. Heme:  Denies abnormal bruising and bleeding. Allergy:  Complains  of seasonal allergies.  Physical Exam  General:  alert, well-hydrated, and overweight-appearing.  HEENT: No facial asymmetry,  EOMI, No sinus tenderness, TM's Clear, oropharynx  pink and moist. Poor dentition.  Chest: Clear to auscultation bilaterally. Adequate air entry throughout. CVS: S1, S2, No murmurs, No S3.   Abd: Soft, Nontender.  MS: Adequate ROM spine, hips,   knees and shoulders.  Ext: No edema.   CNS: CN 2-12 intact, power tone  and sensation normal throughout.   Skin: Intact, tinea versicolor right leg.  Psych: Good eye contact, normal affect.  Memory intact, not anxious or depressed appearing.     Impression & Recommendations:  Problem # 1:  COUGH VARIANT ASTHMA (ICD-493.82) Assessment Improved  Her updated medication list for this problem includes:    Proair Hfa 108 (90 Base) Mcg/act Aers (Albuterol sulfate) .Marland Kitchen... 2 puffs every 4 hrs  Problem # 2:  HYPERTENSION (ICD-401.9) Assessment: Unchanged  Her updated medication list for this problem includes:    Losartan Potassium-hctz 100-25 Mg Tabs (Losartan potassium-hctz) .Marland Kitchen... Take 1 tablet by mouth once a day    Amlodipine Besylate 10 Mg Tabs (Amlodipine besylate) .Marland Kitchen... Take 1 tablet by mouth once a day    Hydralazine Hcl 10 Mg Tabs (Hydralazine hcl) .Marland Kitchen... Take 1 tablet by mouth once a day  Orders: T-Basic Metabolic Panel [id number])  BP today: 136/64 Prior BP: 130/62 (01/01/2010)  Prior 10 Yr Risk Heart Disease: 9 % (03/06/2009)  Labs Reviewed: K+: 4.4 (08/28/2009) Creat: : 1.19 (08/28/2009)   Chol: 208 (08/28/2009)   HDL: 59 (08/28/2009)   LDL: 116 (08/28/2009)   TG: 164 (08/28/2009)  Problem # 3:  HYPERLIPIDEMIA (ICD-272.4) Assessment: Comment Only  Her updated medication list for this problem includes:    Crestor 20 Mg Tabs (Rosuvastatin calcium) .Marland Kitchen... Take 1 tab by mouth at bedtime  Orders: T-Hepatic Function [id number]) T-Lipid Profile [id number])  Labs Reviewed: SGOT: 20 (08/28/2009)   SGPT: 14 (08/28/2009)  Lipid Goals: Chol Goal: 200 (02/07/2007)   HDL Goal: 40 (02/07/2007)   LDL Goal: 100 (08/15/2008)   TG Goal: 150 (02/07/2007)  Prior 10 Yr Risk Heart Disease: 9 % (03/06/2009)   HDL:59 (08/28/2009), 56 (01/30/2009)  LDL:116 (08/28/2009), 114 (01/30/2009)  Chol:208 (08/28/2009), 200 (01/30/2009)  Trig:164 (08/28/2009), 150 (01/30/2009)  Problem # 4:  TINEA VERSICOLOR (ICD-111.0) Assessment: Comment Only  Her updated  medication list for this problem includes:    Clotrimazole-betamethasone 1-0.05 % Crea (Clotrimazole-betamethasone) .Marland Kitchen... Apply twice daily to rash on right  leg for 10 days , then as needed  Complete Medication List: 1)  Aspirin 81 Mg Tbec (Aspirin) .Marland Kitchen.. 1 by mouth once daily 2)  Centrum Silver Tabs (Multiple vitamins-minerals) .... One daily 3)  Crestor 20 Mg Tabs (Rosuvastatin calcium) .... Take 1 tab by mouth at bedtime 4)  Losartan Potassium-hctz 100-25 Mg Tabs (Losartan potassium-hctz) .... Take 1 tablet by mouth once a day 5)  Amlodipine Besylate 10 Mg Tabs (Amlodipine besylate) .... Take 1 tablet by mouth once a day 6)  Hydralazine Hcl 10 Mg Tabs (Hydralazine hcl) .... Take 1 tablet by mouth once a day 7)  Proair Hfa 108 (90 Base) Mcg/act Aers (Albuterol sulfate) .... 2 puffs every 4 hrs 8)  Clotrimazole-betamethasone 1-0.05 % Crea (Clotrimazole-betamethasone) .... Apply twice daily to rash on right  leg for 10 days , then as needed  Other Orders: T-TSH [id number]) T-Vitamin D (25-Hydroxy) [id number])  Patient Instructions: 1)  Please schedule a follow-up appointment in 4 months. 2)  It is  important that you exercise regularly at least 20 minutes 5 times a week. If you develop chest pain, have severe difficulty breathing, or feel very tired , stop exercising immediately and seek medical attention. 3)  BMP prior to visit, ICD-9: 4)  Hepatic Panel prior to visit, ICD-9: 5)  Lipid Panel prior to visit, ICD-9: fasting this morning. 6)  TSH prior to visit, ICD-9: 7)  CBC w/ Diff prior to visit, ICD-9: 8)  Vitamin D 9)  Med is sent in for the rash on your leg Prescriptions: CLOTRIMAZOLE-BETAMETHASONE 1-0.05 % CREA (CLOTRIMAZOLE-BETAMETHASONE) apply twice daily to rash on right  leg for 10 days , then as needed  #45 gm x 0   Entered and Authorized by:   Syliva Overman MD   Signed by:   Syliva Overman MD on 04/16/2010   Method used:   Electronically to        [facility]* (retail)       [address]       [place], [place]  [address]       Ph: [phone] or [phone]       Fax: [phone]   RxID:   [id number]

## 2010-12-21 NOTE — Letter (Signed)
Summary: Letter to [facility] appt  Letter to [facility] appt   Imported By: Lind Guest 10/28/2010 12:44:41  _____________________________________________________________________  External Attachment:    Type:   Image     Comment:   External Document

## 2010-12-21 NOTE — Assessment & Plan Note (Signed)
Summary: OFFICE VISIT   Vital Signs:  Patient profile:   77 year old female Menstrual status:  hysterectomy Height:      63 inches Weight:      153.50 pounds BMI:     27.29 O2 Sat:      99 % Pulse rate:   83 / minute Resp:     16 per minute BP sitting:   140 / 60  (left arm)  Vitals Entered By: Everitt Amber LPN (August 27, 2010 9:49 AM) CC: Patient c/o sinus congestion, clear drainage, no other symptoms   Primary Provider:  Syliva Overman MD  CC:  Patient c/o sinus congestion, clear drainage, and no other symptoms.  History of Present Illness: Pt presents today for check up.  Overall she is feeling well. Hx of htn. Taking medications as prescribed and denies side effects.  No headache, chest pain or palpitations. Hx of Hyperlipidemia.  Taking medications as prescribed and no side effects.  Following a low fat diet. Not exercising regularly. Hx of asthma.  Breathing has been doing well.  No cough.  Pt states she has had some clear nasal congestion but the allergy pill she takes heps with this. Pt states she got her Flu vaccine yesterday.     Allergies: 1)  ! Nystop (Nystatin)  Past History:  Past medical history reviewed for relevance to current acute and chronic problems.  Past Medical History: Reviewed history from 04/16/2010 and no changes required. 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 ENT:  Complains of nasal congestion; denies earache, sinus pressure, and sore throat. CV:  Denies chest pain or discomfort, lightheadness, and palpitations. Resp:  Denies cough and shortness  of breath. GI:  Denies abdominal pain, change in bowel habits, nausea, and vomiting. Allergy:  Complains of seasonal allergies and sneezing.  Physical Exam  General:  Well-developed,well-nourished,in no acute distress; alert,appropriate and cooperative throughout examination Head:  Normocephalic and atraumatic without obvious abnormalities. No apparent alopecia or balding. Ears:  External ear exam shows no significant lesions or deformities.  Otoscopic examination reveals clear canals, tympanic membranes are intact bilaterally without bulging, retraction, inflammation or discharge. Hearing is grossly normal bilaterally. Nose:  External nasal examination shows no deformity or inflammation. Nasal mucosa are pink and moist without lesions or exudates. Mouth:  Oral mucosa and oropharynx without lesions or exudates.  . Neck:  No deformities, masses, or tenderness noted. Lungs:  Normal respiratory effort, chest expands symmetrically. Lungs are clear to auscultation, no crackles or wheezes. Heart:  Normal rate and regular rhythm. S1 and S2 normal without gallop, murmur, click, rub or other extra sounds. Cervical Nodes:  No lymphadenopathy noted Psych:  Cognition and judgment appear intact. Alert and cooperative with normal attention span and concentration. No apparent delusions, illusions, hallucinations   Impression & Recommendations:  Problem # 1:  HYPERTENSION (ICD-401.9) Assessment Comment Only  Her updated medication list for this problem includes:    Losartan Potassium-hctz 100-25 Mg Tabs (Losartan potassium-hctz) .Marland Kitchen... Take 1 tablet by mouth once a day    Amlodipine Besylate 10 Mg Tabs (Amlodipine besylate) .Marland Kitchen... Take 1 tablet by mouth once a day    Hydralazine Hcl 10 Mg Tabs (Hydralazine hcl) .Marland KitchenMarland KitchenMarland KitchenMarland Kitchen  Take 1 tablet by mouth once a day  BP today: 140/60 Prior BP: 130/60 (06/22/2010)  Prior 10 Yr Risk Heart Disease: 9 % (03/06/2009)  Labs Reviewed: K+: 3.8 (04/16/2010) Creat: : 1.51  (04/16/2010)   Chol: 177 (04/16/2010)   HDL: 47 (04/16/2010)   LDL: 98 (04/16/2010)   TG: 158 (04/16/2010)  Problem # 2:  HYPERLIPIDEMIA (ICD-272.4) Assessment: Comment Only  Her updated medication list for this problem includes:    Crestor 20 Mg Tabs (Rosuvastatin calcium) .Marland Kitchen... Take 1 tab by mouth at bedtime  Labs Reviewed: SGOT: 20 (04/16/2010)   SGPT: 11 (04/16/2010)  Lipid Goals: Chol Goal: 200 (02/07/2007)   HDL Goal: 40 (02/07/2007)   LDL Goal: 100 (08/15/2008)   TG Goal: 150 (02/07/2007)  Prior 10 Yr Risk Heart Disease: 9 % (03/06/2009)   HDL:47 (04/16/2010), 59 (08/28/2009)  LDL:98 (04/16/2010), 116 (04/54/0981)  Chol:177 (04/16/2010), 208 (08/28/2009)  Trig:158 (04/16/2010), 164 (08/28/2009)  Problem # 3:  ALLERGIC RHINITIS (ICD-477.9) Assessment: Comment Only  Problem # 4:  COUGH VARIANT ASTHMA (ICD-493.82) Assessment: Comment Only  Her updated medication list for this problem includes:    Proair Hfa 108 (90 Base) Mcg/act Aers (Albuterol sulfate) .Marland Kitchen... 2 puffs every 4 hrs  Complete Medication List: 1)  Aspirin 81 Mg Tbec (Aspirin) .Marland Kitchen.. 1 by mouth once daily 2)  Centrum Silver Tabs (Multiple vitamins-minerals) .... One daily 3)  Crestor 20 Mg Tabs (Rosuvastatin calcium) .... Take 1 tab by mouth at bedtime 4)  Losartan Potassium-hctz 100-25 Mg Tabs (Losartan potassium-hctz) .... Take 1 tablet by mouth once a day 5)  Amlodipine Besylate 10 Mg Tabs (Amlodipine besylate) .... Take 1 tablet by mouth once a day 6)  Hydralazine Hcl 10 Mg Tabs (Hydralazine hcl) .... Take 1 tablet by mouth once a day 7)  Proair Hfa 108 (90 Base) Mcg/act Aers (Albuterol sulfate) .... 2 puffs every 4 hrs 8)  Clotrimazole-betamethasone 1-0.05 % Crea (Clotrimazole-betamethasone) .... Apply twice daily to rash on right  leg for 10 days , then as needed 9)  Vitamin D (ergocalciferol) 50000 Unit Caps (Ergocalciferol) .... One tab once a week  Other Orders: T-Comprehensive Metabolic Panel  ([id number]) T-Lipid Profile ([id number]) T-CBC No Diff ([id number])  Patient Instructions: 1)  Please schedule a follow-up appointment in 3 months. 2)  Have your blood work drawn fasting. 3)  Continue your current medications.

## 2010-12-21 NOTE — Progress Notes (Signed)
Summary: [facility]  [facility]   Imported By: Lind Guest 02/26/2010 16:28:19  _____________________________________________________________________  External Attachment:    Type:   Image     Comment:   External Document

## 2010-12-21 NOTE — Assessment & Plan Note (Signed)
Summary: cough- room 1   Vital Signs:  Patient profile:   77 year old female Menstrual status:  hysterectomy Height:      63 inches Weight:      153.75 pounds BMI:     27.33 O2 Sat:      98 % on Room air Pulse rate:   99 / minute Resp:     16 per minute BP sitting:   130 / 60  (left arm)  Vitals Entered By: Adella Hare LPN (June 22, 2010 8:23 AM) CC: productive cough, clear sputum Is Patient Diabetic? No Pain Assessment Patient in pain? no        Primary Provider:  Syliva Overman MD  CC:  productive cough and clear sputum.  History of Present Illness: Pt presents today with c/o cough off & on since Feb.  The cough is sometimes prod with clear phlegm.  No HS awakening.  No difficulty breathing.  Cough mostly occurs with exposure to cigarette smoke, perfumes and other strong smells.  Also with allergens.  Pt is requesting an injection today to see if this would help.  She uses cough gtts as needed, and these do help some.  Pt denieds fever, chill, or nasal congestion. CXR 04-10-10 @ [facility] Neg.  Current Medications (verified): 1)  Aspirin 81 Mg Tbec (Aspirin) .Marland Kitchen.. 1 By Mouth Once Daily 2)  Centrum Silver  Tabs (Multiple Vitamins-Minerals) .... One Daily 3)  Crestor 20 Mg Tabs (Rosuvastatin Calcium) .... Take 1 Tab By Mouth At Bedtime 4)  Losartan Potassium-Hctz 100-25 Mg Tabs (Losartan Potassium-Hctz) .... Take 1 Tablet By Mouth Once A Day 5)  Amlodipine Besylate 10 Mg Tabs (Amlodipine Besylate) .... Take 1 Tablet By Mouth Once A Day 6)  Hydralazine Hcl 10 Mg Tabs (Hydralazine Hcl) .... Take 1 Tablet By Mouth Once A Day 7)  Proair Hfa 108 (90 Base) Mcg/act Aers (Albuterol Sulfate) .... 2 Puffs Every 4 Hrs 8)  Clotrimazole-Betamethasone 1-0.05 % Crea (Clotrimazole-Betamethasone) .... Apply Twice Daily To Rash On Right  Leg For 10 Days , Then As Needed 9)  Vitamin D (Ergocalciferol) 50000 Unit Caps (Ergocalciferol) .... One Tab Once A Week  Allergies (verified): 1)  !  Nystop (Nystatin)  Past History:  Past medical history reviewed for relevance to current acute and chronic problems.  Past Medical History: Reviewed history from 04/16/2010 and no changes required. 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 ENT:  Denies earache, nasal congestion, and sore throat. CV:  Denies chest pain or discomfort. Resp:  Complains of cough and sputum productive; denies shortness of breath and wheezing. Allergy:  Complains of seasonal allergies; denies itching eyes and sneezing.  Physical Exam  General:  Well-developed,well-nourished,in no acute distress; alert,appropriate and cooperative throughout examination Head:  Normocephalic and atraumatic without obvious abnormalities. No apparent alopecia or balding. Ears:  External ear exam shows no significant lesions or deformities.  Otoscopic examination reveals clear canals, tympanic membranes are intact bilaterally without bulging, retraction, inflammation or discharge. Hearing is grossly normal bilaterally. Nose:  External nasal examination shows no deformity or inflammation. Nasal mucosa are pink and moist without lesions or exudates. Mouth:  Oral mucosa and oropharynx without lesions or exudates.  Neck:  No deformities, masses, or tenderness noted. Lungs:  Normal respiratory effort, chest expands symmetrically. Lungs are clear to auscultation, no crackles or wheezes. Heart:  Normal rate and regular rhythm. S1 and S2 normal without gallop, murmur, click, rub or other extra sounds. Cervical Nodes:  No lymphadenopathy noted Psych:   normally interactive, good eye contact, and not anxious appearing.     Impression & Recommendations:  Problem # 1:  COUGH VARIANT ASTHMA (ICD-493.82) Assessment Deteriorated  Her updated medication list for this problem includes:    Proair Hfa 108 (90 Base) Mcg/act Aers (Albuterol sulfate) .Marland Kitchen... 2 puffs every 4 hrs  Orders: Depo- Medrol 80mg  (J1040) Admin of Therapeutic Inj  intramuscular or subcutaneous (63875)  Complete Medication List: 1)  Aspirin 81 Mg Tbec (Aspirin) .Marland Kitchen.. 1 by mouth once daily 2)  Centrum Silver Tabs (Multiple vitamins-minerals) .... One daily 3)  Crestor 20 Mg Tabs (Rosuvastatin calcium) .... Take 1 tab by mouth at bedtime 4)  Losartan Potassium-hctz 100-25 Mg Tabs (Losartan potassium-hctz) .... Take 1 tablet by mouth once a day 5)  Amlodipine Besylate 10 Mg Tabs (Amlodipine besylate) .... Take 1 tablet by mouth once a day 6)  Hydralazine Hcl 10 Mg Tabs (Hydralazine hcl) .... Take 1 tablet by mouth once a day 7)  Proair Hfa 108 (90 Base) Mcg/act Aers (Albuterol sulfate) .... 2 puffs every 4 hrs 8)  Clotrimazole-betamethasone 1-0.05 % Crea (Clotrimazole-betamethasone) .... Apply twice daily to rash on right  leg for 10 days , then as needed 9)  Vitamin D (ergocalciferol) 50000 Unit Caps (Ergocalciferol) .... One tab once a week  Patient Instructions: 1)  Keep your next appt with Dr Lodema Hong.  We will see you sooner if needed. 2)  You have received a shot of Depo Medrol today to help with your cough. 3)  Continue using cough drops as needed.   Medication Administration  Injection # 1:    Medication: Depo- Medrol 80mg     Diagnosis: COUGH VARIANT ASTHMA (ICD-493.82)    Route: IM    Site: LUOQ gluteus    Exp Date: 5/12    Lot #: [id number]    Mfr: Pharmacia    Patient tolerated injection without complications    Given by: Adella Hare LPN (June 22, 2010 9:23 AM)  Orders Added: 1)  Depo- Medrol 80mg  [J1040] 2)  Admin of Therapeutic Inj  intramuscular or  subcutaneous [96372] 3)  Est. Patient Level IV [95188]

## 2010-12-21 NOTE — Miscellaneous (Signed)
Summary: flu shot  Clinical Lists Changes  Observations: Added new observation of FLU VAX: Historical (08/26/2010 15:26)      Influenza Immunization History:    Influenza # 1:  Historical (08/26/2010) given at [facility]

## 2010-12-23 NOTE — Letter (Signed)
Summary: discontinue vitamin d  discontinue vitamin d   Imported By: Lind Guest 12/09/2010 11:31:27  _____________________________________________________________________  External Attachment:    Type:   Image     Comment:   External Document

## 2010-12-29 NOTE — Assessment & Plan Note (Signed)
Summary: office visit   Vital Signs:  Patient profile:   77 year old female Menstrual status:  hysterectomy Height:      63 inches Weight:      156.50 pounds BMI:     27.82 O2 Sat:      96 % Pulse rate:   96 / minute Pulse rhythm:   regular Resp:     16 per minute BP sitting:   140 / 60  (left arm) Cuff size:   regular  Vitals Entered By: Everitt Amber LPN (December 09, 2010 8:13 AM)  Nutrition Counseling: Patient's BMI is greater than 25 and therefore counseled on weight management options. CC: Follow up, wheezy at night, coughing up whitish phlegm, going on for about a month.    Primary Care Provider:  Syliva Overman MD  CC:  Follow up, wheezy at night, coughing up whitish phlegm, and going on for about a month. .  History of Present Illness: coughing and wheezing esp with certain fumes for the past 1 month.  Reports  that she has otherwise been  doing well. Denies recent fever or chills. Denies sinus pressure, nasal congestion , ear pain or sore throat.  Denies chest pain, palpitations, PND, orthopnea or leg swelling. Denies abdominal pain, nausea, vomitting, diarrhea or constipation. Denies change in bowel movements or bloody stool. Denies dysuria , frequency, incontinence or hesitancy. Denies  joint pain, swelling, or reduced mobility. Denies headaches, vertigo, seizures. Denies depression, anxiety or insomnia. Denies  rash, lesions, or itch.     Current Medications (verified): 1)  Aspirin 81 Mg Tbec (Aspirin) .Marland Kitchen.. 1 By Mouth Once Daily 2)  Centrum Silver  Tabs (Multiple Vitamins-Minerals) .... One Daily 3)  Crestor 20 Mg Tabs (Rosuvastatin Calcium) .... Take 1 Tab By Mouth At Bedtime 4)  Losartan Potassium-Hctz 100-25 Mg Tabs (Losartan Potassium-Hctz) .... Take 1 Tablet By Mouth Once A Day 5)  Amlodipine Besylate 10 Mg Tabs (Amlodipine Besylate) .... Take 1 Tablet By Mouth Once A Day 6)  Hydralazine Hcl 10 Mg Tabs (Hydralazine Hcl) .... Take 1 Tablet By Mouth  Once A Day 7)  Vitamin D (Ergocalciferol) 50000 Unit Caps (Ergocalciferol) .... One Tab Once A Week  Allergies (verified): 1)  ! Nystop (Nystatin)  Review of Systems      See HPI General:  Complains of fatigue. Eyes:  Denies discharge and red eye. Resp:  Complains of cough, sputum productive, and wheezing. Endo:  Denies cold intolerance and excessive hunger. Heme:  Denies abnormal bruising and bleeding. Allergy:  Complains of seasonal allergies; denies hives or rash and itching eyes.  Physical Exam  General:  Well-developed,well-nourished,in no acute distress; alert,appropriate and cooperative throughout examination HEENT: No facial asymmetry,  EOMI, No sinus tenderness, TM's Clear, oropharynx  pink and moist.   Chest:decreased air entry, scattered wheezes CVS: S1, S2, No murmurs, No S3.   Abd: Soft, Nontender.  MS: Adequate ROM spine, hips, shoulders and knees.  Ext: No edema.   CNS: CN 2-12 intact, power tone and sensation normal throughout.   Skin: Intact, no visible lesions or rashes.  Psych: Good eye contact, normal affect.  Memory intact, not anxious or depressed appearing.    Impression & Recommendations:  Problem # 1:  HYPERTENSION (ICD-401.9) Assessment Unchanged  Her updated medication list for this problem includes:    Losartan Potassium-hctz 100-25 Mg Tabs (Losartan potassium-hctz) .Marland Kitchen... Take 1 tablet by mouth once a day    Amlodipine Besylate 10 Mg Tabs (Amlodipine besylate) .Marland Kitchen... Take 1  tablet by mouth once a day    Hydralazine Hcl 10 Mg Tabs (Hydralazine hcl) .Marland Kitchen... Take 1 tablet by mouth once a day  Orders: T-Basic Metabolic Panel [phone])  BP today: 140/60 Prior BP: 140/60 (08/27/2010)  Prior 10 Yr Risk Heart Disease: 9 % (03/06/2009)  Labs Reviewed: K+: 4.6 (09/07/2010) Creat: : 1.18 (09/07/2010)   Chol: 204 (09/07/2010)   HDL: 61 (09/07/2010)   LDL: 107 (09/07/2010)   TG: 181 (09/07/2010)  Problem # 2:  HYPERLIPIDEMIA  (ICD-272.4) Assessment: Comment Only  Her updated medication list for this problem includes:    Crestor 20 Mg Tabs (Rosuvastatin calcium) .Marland Kitchen... Take 1 tab by mouth at bedtime  Orders: T-Lipid Profile [id number]) T-Hepatic Function [phone]) Low fat dietdiscussed and encouraged  Labs Reviewed: SGOT: 23 (09/07/2010)   SGPT: 18 (09/07/2010)  Lipid Goals: Chol Goal: 200 (02/07/2007)   HDL Goal: 40 (02/07/2007)   LDL Goal: 100 (08/15/2008)   TG Goal: 150 (02/07/2007)  Prior 10 Yr Risk Heart Disease: 9 % (03/06/2009)   HDL:61 (09/07/2010), 47 (04/16/2010)  LDL:107 (09/07/2010), 98 (57/84/6962)  Chol:204 (09/07/2010), 177 (04/16/2010)  Trig:181 (09/07/2010), 158 (04/16/2010)  Problem # 3:  BREAST CANCER, HX OF (ICD-V10.3) Assessment: Comment Only still refusing mamograms  Problem # 4:  COUGH VARIANT ASTHMA (ICD-493.82) Assessment: Deteriorated  The following medications were removed from the medication list:    Proair Hfa 108 (90 Base) Mcg/act Aers (Albuterol sulfate) .Marland Kitchen... 2 puffs every 4 hrs  Orders: Medicare Electronic Prescription [phone])  Complete Medication List: 1)  Aspirin 81 Mg Tbec (Aspirin) .Marland Kitchen.. 1 by mouth once daily 2)  Centrum Silver Tabs (Multiple vitamins-minerals) .... One daily 3)  Crestor 20 Mg Tabs (Rosuvastatin calcium) .... Take 1 tab by mouth at bedtime 4)  Losartan Potassium-hctz 100-25 Mg Tabs (Losartan potassium-hctz) .... Take 1 tablet by mouth once a day 5)  Amlodipine Besylate 10 Mg Tabs (Amlodipine besylate) .... Take 1 tablet by mouth once a day 6)  Hydralazine Hcl 10 Mg Tabs (Hydralazine hcl) .... Take 1 tablet by mouth once a day 7)  Tessalon Perles 100 Mg Caps (Benzonatate) .... Take 1 capsule by mouth three times a day as needed for cough and chest congestion 8)  Calcium With D 1200mg /1000iu Gel Cap  .... One daily  Other Orders: T-Vitamin D (25-Hydroxy) [phone])  Patient Instructions: 1)  Please schedule a follow-up appointment in  4 months. 2)  It is important that you exercise regularly at least 20 minutes 5 times a week. If you develop chest pain, have severe difficulty breathing, or feel very tired , stop exercising immediately and seek medical attention. 3)  BMP prior to visit, ICD-9: 4)  Hepatic Panel prior to visit, ICD-9: 5)  Lipid Panel prior to visit, ICD-9:   fasting in 4 months 6)  vit D 7)  pls stop the vit D when you finish the bottle, 8)  start once daily calcium with d gel capsule. 9)  new med sent for cough as needed Prescriptions: HYDRALAZINE HCL 10 MG TABS (HYDRALAZINE HCL) Take 1 tablet by mouth once a day  #30 x 2   Entered by:   Everitt Amber LPN   Authorized by:   Syliva Overman MD   Signed by:   Everitt Amber LPN on 25/36/6440   Method used:   Electronically to        [facility]* (retail)       [address]       [place], [place]  [address]       Ph: [phone] or [phone]       Fax: [phone]   RxID:   [id number] AMLODIPINE BESYLATE 10 MG TABS (AMLODIPINE BESYLATE) Take 1 tablet by mouth once a day  #30 x 3   Entered by:   Adella Hare LPN   Authorized by:   Syliva Overman MD   Signed by:   Adella Hare LPN on 18/84/1660   Method used:   Electronically to        [facility]* (retail)       [address]       [place], [place]  [address]       Ph: [phone] or [phone]       Fax: [phone]   RxID:   [id number] LOSARTAN POTASSIUM-HCTZ 100-25 MG TABS (LOSARTAN POTASSIUM-HCTZ) Take 1 tablet by mouth once a day  #30 x 3   Entered by:   Adella Hare LPN   Authorized by:   Syliva Overman MD   Signed by:   Adella Hare LPN on 85/46/2703   Method used:   Electronically to        [facility]* (retail)       [address]       [place], [place]  [address]       Ph: [phone] or [phone]       Fax: [phone]   RxID:   [id number] CRESTOR 20 MG TABS  (ROSUVASTATIN CALCIUM) Take 1 tab by mouth at bedtime  #30 x 3   Entered by:   Adella Hare LPN   Authorized by:   Syliva Overman MD   Signed by:   Adella Hare LPN on 00/86/7619   Method used:   Electronically to        [facility] (retail)       [address]       [place], [place]  [address]       Ph: [phone] or [phone]       Fax: [phone]   RxID:   [id number] TESSALON PERLES 100 MG CAPS (BENZONATATE) Take 1 capsule by mouth three times a day as needed for cough and chest congestion  #42 x 0   Entered and Authorized by:   Syliva Overman MD   Signed by:   Syliva Overman MD on 12/09/2010   Method used:   Printed then faxed to ...       [facility]* (retail)       [address]       [place], [place]  [address]       Ph: [phone] or [phone]       Fax: [phone]   RxID:   [id number]    Orders Added: 1)  Est. Patient Level IV [41287] 2)  Medicare Electronic Prescription [G8553] 3)  T-Basic Metabolic Panel [80048-22910] 4)  T-Lipid Profile [80061-22930] 5)  T-Hepatic Function [80076-22960] 6)  T-Vitamin D (25-Hydroxy) [phone]

## 2011-01-05 ENCOUNTER — Telehealth: Payer: Self-pay | Admitting: Family Medicine

## 2011-01-12 NOTE — Progress Notes (Signed)
  Phone Note From Pharmacy   Caller: [facility]* Summary of Call: requesting refill on tessalon pearles Initial call taken by: Adella Hare LPN,  January 05, 2011 11:28 AM  Follow-up for Phone Call        pls refill x 1 Follow-up by: Syliva Overman MD,  January 05, 2011 4:01 PM    Prescriptions: TESSALON PERLES 100 MG CAPS (BENZONATATE) Take 1 capsule by mouth three times a day as needed for cough and chest congestion  #42 x 0   Entered by:   Adella Hare LPN   Authorized by:   Syliva Overman MD   Signed by:   Adella Hare LPN on 30/86/5784   Method used:   Electronically to        [facility] (retail)       [address]       [place], [place]  [address]       Ph: [phone] or [phone]       Fax: [phone]   RxID:   [id number]

## 2011-01-28 ENCOUNTER — Other Ambulatory Visit (HOSPITAL_COMMUNITY): Payer: Medicare Other

## 2011-01-28 ENCOUNTER — Encounter (HOSPITAL_COMMUNITY): Payer: Medicare Other | Attending: Oncology

## 2011-01-28 DIAGNOSIS — Z79899 Other long term (current) drug therapy: Secondary | ICD-10-CM | POA: Insufficient documentation

## 2011-01-28 DIAGNOSIS — I10 Essential (primary) hypertension: Secondary | ICD-10-CM | POA: Insufficient documentation

## 2011-01-28 DIAGNOSIS — D649 Anemia, unspecified: Secondary | ICD-10-CM | POA: Insufficient documentation

## 2011-01-28 DIAGNOSIS — Z853 Personal history of malignant neoplasm of breast: Secondary | ICD-10-CM | POA: Insufficient documentation

## 2011-01-28 DIAGNOSIS — E785 Hyperlipidemia, unspecified: Secondary | ICD-10-CM | POA: Insufficient documentation

## 2011-01-28 DIAGNOSIS — C50919 Malignant neoplasm of unspecified site of unspecified female breast: Secondary | ICD-10-CM

## 2011-02-06 LAB — COMPREHENSIVE METABOLIC PANEL
ALT: 15 U/L (ref 0–35)
AST: 29 U/L (ref 0–37)
Albumin: 3.8 g/dL (ref 3.5–5.2)
Alkaline Phosphatase: 44 U/L (ref 39–117)
BUN: 21 mg/dL (ref 6–23)
Chloride: 98 mEq/L (ref 96–112)
Potassium: 3.4 mEq/L — ABNORMAL LOW (ref 3.5–5.1)
Total Bilirubin: 0.3 mg/dL (ref 0.3–1.2)

## 2011-02-06 LAB — DIFFERENTIAL
Basophils Absolute: 0.1 10*3/uL (ref 0.0–0.1)
Basophils Relative: 1 % (ref 0–1)
Eosinophils Absolute: 0 10*3/uL (ref 0.0–0.7)
Eosinophils Relative: 0 % (ref 0–5)
Lymphocytes Relative: 10 % — ABNORMAL LOW (ref 12–46)
Lymphs Abs: 0.7 10*3/uL (ref 0.7–4.0)
Monocytes Absolute: 0.4 10*3/uL (ref 0.1–1.0)
Monocytes Relative: 7 % (ref 3–12)
Neutro Abs: 5.2 10*3/uL (ref 1.7–7.7)
Neutrophils Relative %: 82 % — ABNORMAL HIGH (ref 43–77)

## 2011-02-06 LAB — CBC
HCT: 32.2 % — ABNORMAL LOW (ref 36.0–46.0)
Hemoglobin: 10.4 g/dL — ABNORMAL LOW (ref 12.0–15.0)
Platelets: 265 10*3/uL (ref 150–400)
WBC: 6.4 10*3/uL (ref 4.0–10.5)

## 2011-02-16 ENCOUNTER — Ambulatory Visit (HOSPITAL_COMMUNITY): Payer: Medicare Other | Admitting: Oncology

## 2011-02-23 LAB — DIFFERENTIAL
Basophils Absolute: 0 10*3/uL (ref 0.0–0.1)
Basophils Relative: 1 % (ref 0–1)
Eosinophils Absolute: 0.2 10*3/uL (ref 0.0–0.7)
Neutro Abs: 4.8 10*3/uL (ref 1.7–7.7)
Neutrophils Relative %: 62 % (ref 43–77)

## 2011-02-23 LAB — CBC
MCHC: 32.8 g/dL (ref 30.0–36.0)
Platelets: 310 10*3/uL (ref 150–400)
RDW: 13.9 % (ref 11.5–15.5)

## 2011-02-26 LAB — COMPREHENSIVE METABOLIC PANEL
ALT: 15 U/L (ref 0–35)
AST: 23 U/L (ref 0–37)
Albumin: 4.1 g/dL (ref 3.5–5.2)
CO2: 27 mEq/L (ref 19–32)
Calcium: 9.5 mg/dL (ref 8.4–10.5)
Chloride: 102 mEq/L (ref 96–112)
GFR calc Af Amer: 52 mL/min — ABNORMAL LOW (ref >60)
GFR calc non Af Amer: 43 mL/min — ABNORMAL LOW (ref >60)
Sodium: 138 mEq/L (ref 135–145)
Total Bilirubin: 0.8 mg/dL (ref 0.3–1.2)

## 2011-02-26 LAB — CBC
Platelets: 291 10*3/uL (ref 150–400)
RBC: 3.86 MIL/uL — ABNORMAL LOW (ref 3.87–5.11)
WBC: 6.9 10*3/uL (ref 4.0–10.5)

## 2011-02-26 LAB — DIFFERENTIAL
Eosinophils Absolute: 0.1 10*3/uL (ref 0.0–0.7)
Eosinophils Relative: 2 % (ref 0–5)
Lymphs Abs: 2 10*3/uL (ref 0.7–4.0)
Monocytes Absolute: 0.6 10*3/uL (ref 0.1–1.0)

## 2011-03-01 LAB — CANCER ANTIGEN 27.29: CA 27.29: 6 U/mL (ref 0–39)

## 2011-03-04 ENCOUNTER — Ambulatory Visit (HOSPITAL_COMMUNITY): Payer: Medicare Other | Admitting: Oncology

## 2011-03-11 ENCOUNTER — Encounter (HOSPITAL_COMMUNITY): Payer: Medicare Other

## 2011-03-19 ENCOUNTER — Other Ambulatory Visit: Payer: Self-pay | Admitting: Family Medicine

## 2011-03-25 ENCOUNTER — Other Ambulatory Visit: Payer: Self-pay | Admitting: Family Medicine

## 2011-03-25 ENCOUNTER — Encounter (HOSPITAL_COMMUNITY): Payer: Medicare Other

## 2011-03-25 ENCOUNTER — Encounter (HOSPITAL_COMMUNITY): Payer: Medicare Other | Attending: Oncology

## 2011-03-25 DIAGNOSIS — D649 Anemia, unspecified: Secondary | ICD-10-CM | POA: Insufficient documentation

## 2011-03-25 DIAGNOSIS — Z853 Personal history of malignant neoplasm of breast: Secondary | ICD-10-CM | POA: Insufficient documentation

## 2011-03-25 DIAGNOSIS — E785 Hyperlipidemia, unspecified: Secondary | ICD-10-CM | POA: Insufficient documentation

## 2011-03-25 DIAGNOSIS — Z79899 Other long term (current) drug therapy: Secondary | ICD-10-CM | POA: Insufficient documentation

## 2011-03-25 DIAGNOSIS — I10 Essential (primary) hypertension: Secondary | ICD-10-CM | POA: Insufficient documentation

## 2011-03-25 DIAGNOSIS — C50919 Malignant neoplasm of unspecified site of unspecified female breast: Secondary | ICD-10-CM

## 2011-03-25 LAB — BASIC METABOLIC PANEL
BUN: 22 mg/dL (ref 6–23)
CO2: 27 mEq/L (ref 19–32)
Calcium: 9.6 mg/dL (ref 8.4–10.5)
Chloride: 102 mEq/L (ref 96–112)
Creat: 1.25 mg/dL — ABNORMAL HIGH (ref 0.40–1.20)

## 2011-03-25 LAB — HEPATIC FUNCTION PANEL
AST: 25 U/L (ref 0–37)
Alkaline Phosphatase: 47 U/L (ref 39–117)
Bilirubin, Direct: 0.1 mg/dL (ref 0.0–0.3)
Indirect Bilirubin: 0.5 mg/dL (ref 0.0–0.9)
Total Bilirubin: 0.6 mg/dL (ref 0.3–1.2)

## 2011-03-25 LAB — LIPID PANEL
HDL: 62 mg/dL (ref >39)
LDL Cholesterol: 120 mg/dL — ABNORMAL HIGH (ref 0–99)

## 2011-04-05 NOTE — Procedures (Signed)
CAROTID DUPLEX EXAM   INDICATION:  Follow up known carotid artery disease.   HISTORY:  Diabetes:  No.  Cardiac:  No.  Hypertension:  Yes.  Smoking:  No.  Previous Surgery:  None.  CV History:  Amaurosis Fugax No, Paresthesias No, Hemiparesis No.                                       RIGHT             LEFT  Brachial systolic pressure:         149               N/A  Brachial Doppler waveforms:  Vertebral direction of flow:        Antegrade         Antegrade  DUPLEX VELOCITIES (cm/sec)  CCA peak systolic                   82                78  ECA peak systolic                   163               206  ICA peak systolic                   81                227  ICA end diastolic                   14                32  PLAQUE MORPHOLOGY:                  Heterogenous      Heterogenous  PLAQUE AMOUNT:                      Mild              Moderate  PLAQUE LOCATION:                    ICA, ECA          ICA, ECA   IMPRESSION:  1. 60-79% stenosis noted in the left internal carotid artery.  2. 20-39% stenosis noted in the right internal carotid artery.  3. Antegrade bilateral vertebral arteries.   ___________________________________________  V. Charlena Cross, MD   MG/MEDQ  D:  05/26/2008  T:  05/26/2008  Job:  [id number]

## 2011-04-05 NOTE — H&P (Signed)
Emma Dawson, SELNER                  ACCOUNT NO.:  [id number]   MEDICAL RECORD NO.:  [id number]          PATIENT TYPE:  OBV   LOCATION:  A312                          FACILITY:  [facility]   PHYSICIAN:  Osvaldo Shipper, MD     DATE OF BIRTH:  1932-05-20   DATE OF ADMISSION:  04/26/2008  DATE OF DISCHARGE:  LH                              HISTORY & PHYSICAL   PRIMARY MEDICAL DOCTOR:  Dr. Franchot Heidelberg.   ADMITTING DIAGNOSES:  1. Syncopal episode, likely vasovagal.  2. Urinary tract infection.  3. Chronic kidney disease.  4. Hypertension.  5. Left carotid bruit.  6. Systolic murmur.   CHIEF COMPLAINT:  Passed out.   HISTORY OF PRESENT ILLNESS:  This is a 77 year old African American  female who stated for the past 2 weeks she has been diagnosed with UTI  and then bronchitis. She has been on antibiotic therapy for each of  them. She also was started on Valtrex for presumed genital herpes. The  patient stated that she had an episode of vomiting this afternoon. She  went to her sister's place, and within 15 minutes, she had a syncopal  episode while she was sitting down. She denied any fall or any other  trauma. Denies any chest pain, palpitations. She did feel dizzy prior  this syncope. She admits to having vertiginous symptoms. She had had an  episode of syncope about 5 years ago. She was also dizzy about 4 days  ago when she came into the ED to be evaluated. Everything was found to  be normal, and she was sent home. She has been having cough with  greenish expectoration for which she was recently given antibiotics. She  denied any seizure-type activity. No focal weakness. Denied any  abdominal pain. No more nausea, vomiting, or diarrhea. Currently she  feels completely back to normal.   HOME MEDICATIONS:  1. Valtrex, 7-day course, 1 gram daily.  2. Lipitor 40 mg daily.  3. Hydralazine 10 mg once a day.  4. Amlodipine 10 mg once a day.  5. Hyzaar 100-25 once a day.  6. Aspirin  81 mg once a day.   ALLERGIES:  SULFA DRUGS.   PAST MEDICAL HISTORY:  Positive for:  1. Breast cancer, status post right mastectomy.  2. History of genital herpes simplex.  3. Hypertension.  4. UTI.  5. Hysterectomy in the past.   SOCIAL HISTORY:  Lives in [place], [place]. Lives alone but  she has sisters who live very close by. No smoking, alcohol, or illicit  drug use. She is a fairly active person and does not need any assistive  devices. 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   FAMILY HISTORY:  Noncontributory.   REVIEW OF SYSTEMS:  GENERAL:  Positive for weakness. HEENT:  Unremarkable.  CARDIOVASCULAR:  Unremarkable.  RESPIRATORY:  Positive  for cough. Otherwise unremarkable. No shortness of breath. GI:  Unremarkable except as in HPI.  GU:  Unremarkable. She was having some  dysuria, but none currently. Other systems are unremarkable.   PHYSICAL EXAMINATION:  VITAL SIGNS:  Temperature 99.5, blood pressure  129/49, heart rate 80, respiratory rate 20, saturation 100% on room air.  GENERAL:  Elderly African American female in no distress.  HEENT:  There is no pallor, no icterus. Oral mucous membrane is moist.  No oral lesions are noted.  LUNGS:  Clear to auscultation bilaterally. No wheezes, rales, or  rhonchi.  CARDIOVASCULAR:  S1, S2 normal. Regular. Systolic murmur appreciated at  the aortic area. Carotid bruit on the left side is heard. No other  abnormalities are heard.  NECK:  Soft and supple.  ABDOMEN:  Soft, nontender, nondistended. Bowel sounds are present. No  mass or organomegaly is appreciated.  EXTREMITIES:  No edema. No calf tenderness.  NEUROLOGIC:  She is alert and oriented x3. No focal neurological  deficits are present.   LABORATORY DATA:  Her white count is normal. Hemoglobin is 10.1.  Platelet count is 402. D-dimer was evaluated and 1.24. Sodium 133,  glucose 127, BUN 26, creatinine 1.58. LFTs are normal. Cardiac markers  are negative so far.  Her myoglobin is 233. Her UA shows small  leukocytes, 21-50 WBCs and many bacteria. She had a CT scan of her head  which was negative for any acute abnormality. Chronic atrophy was noted.  Chest x-ray showed stable cardiomegaly; nothing acute. She had a V/Q  scan which was low probability for PE.   EKG was done which shows sinus rhythm with normal axis. Intervals appear  to be in the normal range. No definite Q-wave is identified. No acute T-  wave change is noted. Some evidence for slight early repolarization  noted. Fairly benign EKG at this time.   ASSESSMENT AND PLAN:  This is a 77 year old African American female with  hypertension who presents after a syncopal episode after an episode of  emesis.  1. Syncope. This is most likely vasovagal. She will be observed in the      hospital and rule her out for acute coronary syndrome. She will      need to have carotid Doppler and echocardiogram. Since this is the      weekend, these tests probably cannot be done until Monday. If she      rules out and if she is fairly stable, I think these can be pursued      as an outpatient. Her PMD can also further evaluate other reasons      for syncope. Orthostatics will be checked.  2. Urinary tract infection. Urine cultures will be sent. I will put      her on ciprofloxacin.  3. Hypertension.  Continue her hydralazine, amlodipine, and I will      hold off on the Hyzaar for now and gently hydrate her a little bit.  4. She has, as mentioned earlier, a carotid bruit and a systolic      murmur for which we will acquire a carotid Doppler and an      echocardiogram. Both of these can be potentially done as an      outpatient.  5. She is on chronic kidney disease. She is on Valtrex for presumed      genital herpes which will be continued. She has slightly elevated      myoglobin. I will check a total CK along with cardiac markers. She      is on Lipitor, and so elevated CK could be a result of the  Lipitor.      Osvaldo Shipper, MD  Electronically Signed  GK/MEDQ  D:  04/26/2008  T:  04/26/2008  Job:  [id number]   cc:   Franchot Heidelberg, M.D.

## 2011-04-05 NOTE — Assessment & Plan Note (Signed)
[facility]                       [place] [facility] OFFICE NOTE   Emma Dawson, Emma Dawson                         MRN:          [id number]  DATE:09/16/2008                            DOB:          1932/10/29    REFERRING PHYSICIAN:  Franchot Heidelberg, M.D.   REASON FOR CONSULTATION:  Apparent vasovagal syncope.   HISTORY OF PRESENT ILLNESS:  Emma Dawson is a very pleasant 77 year old  woman with no clear history of cardiovascular disease or dysrhythmia.  She was admitted to the hospital recently after an episode of apparent  syncope.  She states that she was at church, cooking some bacon over hot  stove, and began to feel very warm.  She sat down a chair and then began  to sweat profusely, although otherwise stated that she had no chest  pain, palpitations, or breathlessness.  She was drinking some coffee at  that time as well, states that she put her head back to rest, but  recalls people talking around her without any frank loss of  consciousness.  She was apparently seen by EMS with findings of a blood  sugar of 147 and a good blood pressure based on available records.  She had a previous workup for similar symptomatology back in June and  reports that approximately a year ago, she also had a spell like this  when she got too hot working in the garden.  Otherwise, she describes  herself as being very active.  She states that she walks regularly from  her house to both of her sister's homes without any symptoms of chest  pain or shortness of breath.  This requires going back and forth across  the street several times.  She denies any significant palpitations,  orthopnea, PND, or lower extremity edema.  She was referred for an  echocardiogram during her hospital stay in June and this revealed a left  ventricular ejection fraction of 65-75% with mild ventricular  hypertrophy, mildly thickened aortic valve with mild calcification and  mild-to-moderate  tricuspid regurgitation.  She had additional workup at  that time with no clear findings of abnormalities by head CT scan,  ventilation/perfusion lung scan, or cardiac markers.  She was noted to  have a left carotid bruit and this was ultimately evaluated by Dr.  Myra Gianotti.  She was noted to have a 60-79% left carotid stenosis and this  was felt to be most likely asymptomatic and not related to her episodes  of syncope.  She was started on aspirin, however.  Today in the office,  Emma Dawson states that she feels well.  Her electrocardiogram is normal  showing sinus rhythm at 88 beats per minute with normal intervals.   ALLERGIES:  No known drug allergies.   PRESENT MEDICATIONS:  1. Amlodipine 10 mg p.o. daily.  2. Hydralazine to 10 mg p.o. daily.  3. Crestor 20 mg p.o. daily.  4. Hyzaar 100/25 mg p.o. daily.  5. Aspirin 81 mg p.o. daily.   PAST MEDICAL HISTORY:  As detailed above.  Additional problems include,  previous right mastectomy approximately 5 years ago, chronic  renal  disease with a creatinine of 1.58 in June 2009, chronic anemia with  hemoglobin of 10.1 and MCV of 79.5, hypertension, urinary tract  infection back in June and longstanding cardiac murmur likely related to  vigorous left ventricular function and mildly calcified aortic valve  based on recent echocardiogram.   SOCIAL HISTORY:  The patient denies any tobacco or alcohol use.   FAMILY HISTORY:  Reviewed.  The patient's father died at age 10 of  natural causes.  Mother died at age 55 with a stroke.  She has multiple  siblings, one who died with renal failure at late age, three brothers  with cancer and one with an aneurysm, otherwise noncontributory.   REVIEW OF SYSTEMS:  As outlined above.  She states she has normal  appetite.  She does use glasses to read and has history of cataracts.  She has no history of cough or hemoptysis.  No significant lower  extremity edema, does have some arthritic discomfort in her  knees.  Otherwise negative.   PHYSICAL EXAMINATION:  VITAL SIGNS:  On examination, blood pressure is  134/64, heart rate is 88, weight is 166 pounds.  GENERAL:  The patient is comfortable in no acute distress.  HEENT:  Conjunctiva is normal.  Oropharynx is clear.  NECK:  Supple.  No elevated jugular venous pressure.  There is a soft  left carotid bruit.  No thyromegaly is noted.  LUNGS:  Clear without labored breathing.  CARDIAC:  A regular rate and rhythm.  Soft S4, 2/6 systolic murmur at  the base.  Second heart sound is normal.  No pericardial rub or S3  gallop.  ABDOMEN:  Soft, nontender.  Normoactive bowel sounds.  EXTREMITIES:  No significant pitting edema.  Distal pulses are 2+.  SKIN:  Warm and dry.  MUSCULOSKELETAL:  No kyphosis noted.  NEUROPSYCHIATRIC:  The patient is alert and oriented x3.  Affect is  appropriate.   IMPRESSION AND RECOMMENDATIONS:  History of presyncope and syncope as  outlined above, likely neurocardiogenic in mechanism.  We discussed this  today and I recommended that she keep an eye on her overall hydration  status.  She has a normal resting electrocardiogram and recently  documented vigorous left ventricular systolic function with mildly  calcified aortic valve but no other major valvular abnormalities.  I  think it is unlikely that her recent symptoms are ischemic in etiology  as she has no other exertional symptoms.  It is also likely that her  left carotid disease is not contributing to her recent symptomatology.  Her left carotid stenosis is to be followed going forward by Dr. Myra Gianotti  and certainly aspirin and other risk factor modification with attention  to lipids would be reasonable.  Otherwise, Emma Dawson states that she  generally has been feeling well and I have recommended no further  cardiac  testing at this time unless she continues to manifest symptoms.  In that  case, we may provide an event recorder and plan to see her back for   further assessment.     Jonelle Sidle, MD  Electronically Signed    SGM/MedQ  DD: 09/16/2008  DT: 09/17/2008  Job #: [id number]   cc:   Franchot Heidelberg, M.D.

## 2011-04-05 NOTE — Assessment & Plan Note (Signed)
OFFICE VISIT   Emma Dawson, Emma Dawson  DOB:  1932/06/20                                       05/26/2008  ZOXWR#:[id number]   HISTORY:  This is a 77 year old female who initially presented to the  hospital with dizziness.  She was diagnosed as having syncope, likely  related to a vasovagal episode.  During workup she was found to have  high-grade carotid disease and was sent to my office for further  evaluation.  Her carotid disease was picked up on carotid bruit back in  her hospitalization.  She also had a systolic ejection murmur was  evaluated with echocardiogram.  She comes in today for further  evaluation and management.  She denies having any neurologic symptoms.  Specifically, no numbness or weakness in either extremity.  No slurring  of her speech.  No memory trouble.  No amaurosis fugax.  The patient's  risk factors include hypertension and hypercholesterolemia, both of  which are medically managed and under good control.   REVIEW OF SYSTEMS:  GENERAL:  Negative for fevers, chills, nausea,  vomiting.  CARDIAC:  Negative.  PULMONARY:  Negative.  GI:  Negative with the exception of some constipation.  GU:  Negative.  VASCULAR:  Negative.  NEURO:  Negative.  ORTHO:  Negative.  PSYCH:  Negative.  ENT:  Negative.  HEME:  Negative.   PAST MEDICAL HISTORY:  Hypertension, hypercholesterolemia, chronic  kidney disease.   PAST SURGICAL HISTORY:  Total hysterectomy and right mastectomy.   FAMILY HISTORY:  Negative for cardiovascular disease.   SOCIAL HISTORY:  She is widowed.  Does not smoke.  Does not drink.   MEDICATIONS:  Include amlodipine, hydralazine, baby aspirin per day,  Hyzaar, and Lipitor.   PHYSICAL EXAMINATION:  Blood pressure is 149/72, pulse is 109.  General:  She is well-appearing, no acute distress.  HEENT:  Normocephalic,  atraumatic.  Pupils are equal.  Sclerae anicteric.  Neck is supple.  There is a left carotid bruit.   Cardiovascular:  Regular rate and  rhythm.  Positive ejection murmur.  Pulmonary:  Lungs are clear  bilaterally.  Abdomen:  Soft, nontender.  No pulsatile mass.  Extremities:  Warm, well-perfused.   DIAGNOSTIC STUDIES:  The patient duplex was repeated today, given the  low diastolic velocities found in the original ultrasound.  Ultrasound  today reveals 60-79% stenosis of the left internal carotid artery and 20-  39% stenosis in the right.  Antegrade flow in both vertebral.   ASSESSMENT/PLAN:  This is a 77 year old with asymptomatic left carotid  stenosis, 60-79%.   PLAN:  Based on the patient's duplex measures in our office today, I  feel medical management is the best course of action at this time.  We  will focus on blood pressure control as well as control of her  hypercholesterolemia.  She will be maintained on a baby aspirin per day.  We discussed the signs and symptoms of stroke and she was told that if  any of these occur, she is to come immediately to the emergency  department.   PLAN:  Based on the patient's degree of disease, I plan on repeating her  ultrasound in 6 months.  If it has progressed to greater than 80%, we  will proceed with carotid endarterectomy.   Jorge Ny, MD  Electronically Signed  VWB/MEDQ  D:  05/26/2008  T:  05/27/2008  Job:  [id number]   cc:   Franchot Heidelberg, M.D.

## 2011-04-05 NOTE — Discharge Summary (Signed)
NAMEJULAINE, Emma Dawson                  ACCOUNT NO.:  [id number]   MEDICAL RECORD NO.:  [id number]          PATIENT TYPE:  OBV   LOCATION:  A312                          FACILITY:  [facility]   PHYSICIAN:  Osvaldo Shipper, MD     DATE OF BIRTH:  1932-05-19   DATE OF ADMISSION:  04/26/2008  DATE OF DISCHARGE:  06/07/2009LH                               DISCHARGE SUMMARY   PRIVATE MEDICAL DOCTOR:  Franchot Heidelberg, M.D.   Please review H&P dictated yesterday for details regarding the patient's  presenting illness.   DISCHARGE DIAGNOSES:  1. Syncope likely related to vasovagal episode.  2. Urinary tract infection.  3. Hypertension.  4. Chronic kidney disease.  5. Left carotid bruit requiring outpatient evaluation.  6. Systolic murmur requiring outpatient echocardiogram.   BRIEF HOSPITAL COURSE:  Briefly, this is a 77 year old African American  female who is very active who was recently started on treatment for  genital herpes who also has had completed a treatment for UTI and  bronchitis just a week ago.  The patient yesterday had an episode of  emesis after she ate her lunch and after 15 minutes, she had a syncopal  episode.  She did not have any others symptoms of chest pain, shortness  of breath, or any headaches, or seizure-type activity.  The patient  underwent extensive evaluation when she presented.  Her chest x-ray, CT  of the head, and pulmonary V/Q scan were all negative for any acute  process.  She has so far ruled out for acute coronary syndrome.  Last  cardiac markers due at 10 o'clock this morning.  The only thing we found  positive was UTI.  Urine cultures were ordered, and the patient was  started on Cipro.  The patient has been completely asymptomatic since  she has been in the hospital.  Orthostatics were checked and this was  also normal.  So the reason for a syncopal episode is most likely  vasovagal. But she does have a carotid bruit on the left side.  She does  have a  systolic murmur in the aortic area, both of which require  evaluation.  These evaluations can be done as outpatient.  These tests  usually not done on a Sunday.  So, I think the patient will be okay for  discharge once the 10 o'clock cardiac panel this morning is negative.  I  have written her prescriptions for the echocardiogram and the carotid  Dopplers to be done as outpatient.   DISCHARGE MEDICATIONS:  1. Cipro 250 mg p.o. b.i.d. for 7 days.  2. She may continue with her Valtrex 1 g daily for 7 days.  3. Lipitor 40 mg daily.  4. Hydralazine 10 mg daily.  5. Amlodipine 10 mg daily.  6. Hyzaar 100/25 mg,  1 tablet daily.  7. Aspirin 81 mg daily.   FOLLOWUP:  1. Follow up with Dr. Erby Pian sometime this week.  2. Echocardiogram with [facility], phone number has been      provided.  3. Carotid Dopplers to evaluate left carotid bruit, phone number has  been provided to make this appointment as well.   No consultations obtained during this admission.   Total time on this discharge encounter 25 minutes.      Osvaldo Shipper, MD  Electronically Signed     GK/MEDQ  D:  04/27/2008  T:  04/27/2008  Job:  [id number]   cc:   Franchot Heidelberg, M.D.

## 2011-04-08 NOTE — Op Note (Signed)
NAME:  Emma Dawson, Emma Dawson                            ACCOUNT NO.:  [id number]   MEDICAL RECORD NO.:  [id number]                   PATIENT TYPE:  INP   LOCATION:  A307                                 FACILITY:  [facility]   PHYSICIAN:  Dirk Dress. Katrinka Blazing, M.D.                DATE OF BIRTH:  September 28, 1932   DATE OF PROCEDURE:  05/06/2003  DATE OF DISCHARGE:                                 OPERATIVE REPORT   PREOPERATIVE DIAGNOSES:  1. Right breast mass.  2. Multiple left breast masses.   POSTOPERATIVE DIAGNOSES:  1. Carcinoma of the right breast.  2. Multiple fibroadenomas of the left breast.  3. Negative nodes.   PROCEDURE:  1. Right modified radical mastectomy.  2. Left partial mastectomy with node dissection.  3. Left subclavian Port-A-Cath insertion for venous access.   SURGEON:  Dirk Dress. Katrinka Blazing, M.D.   PROCEDURE:  Under general anesthesia, the patient's chest and breasts were  prepped and draped in a sterile field.  The right side was approached  initially.  An elliptical incision starting at the lower aspect of the  axilla and encompassing the large mass in the upper inner quadrant and  extending medially toward the sternum was made.  The superior flap was  developed without difficulty.  Inferior flap was developed without  difficulty.  The breast was separated from the chest wall.  Dissection  extended into the axilla.  The thoracodorsal neurovascular bundle and the  long thoracic nerve were preserved.  The intercostal brachial cutaneous  nerve was sacrificed.  Vessels were doubly clipped and divided in the  axilla.  The axillary contents was dissected completely along with the  breast.  There were multiple hard, matted nodes in the axilla.   Once the right side was completed, the left side was addressed.  There were  four large nodes that needed to be excised, and there were palpable, firm  nodes in the axilla.  It was elected to try to do the axillary node  dissection initially and  extend this based on the pathology.  Dissection was  continued down into the first and second portions of the axilla.  The  palpable nodes and the surrounding tissue were excised.  The vessels were  doubly clipped and divided.  The specimens were marked and were sent to  pathology for frozen section.  Next the incision was extended inferiorly and  medially in order to gain access to the four larger masses and the one  smaller mass that could be felt.  These were circumferentially excised and  sent to pathology properly labeled and with good surrounding normal tissue.  Once this was done this wound was packed, and attention was turned to the  right side.  Two JP drains were placed, one in the axilla and one in the  more medial skin flap.  Hemostasis was adequate.  The skin flaps were  reapproximated using running 2-0 Biosyn and 3-0 Biosyn.  Skin was closed  with subcuticular 4-0 Dexon.  JP drains were secured with 3-0 nylon.  This  portion of the procedure was going well.  While awaiting the results from  the left side, the wound closure on the right was completed.  Pathology  returned, and four of the lymph nodes were negative for tumor and appeared  to be simple reactive nodes.  It was elected not to do further surgery at  this time since the major branches had been removed.  JP drain was placed in  the axilla and brought out through the three other areas on the inframammary  portion of the breast.  The dead space was closed with interrupted 3-0  Biosyn.  Subcutaneous tissue was closed with 2-0 Biosyn and staples.  The  drain was secured with 3-0 nylon.  We broke scrub and the neck was prepped  and draped in a sterile field.  The subclavian vein was accessed without  difficulty.  The catheter was tunneled.  The catheter was positioned in the  superior vena cava without difficulty.  It was attached to the chamber.  The  chamber was flushed.  I could not aspirate adequately, but I felt that  the  catheter was up against the vessel wall.  Fluoroscopy confirmed the catheter  was well-positioned in the superior vena cava.  The chamber was flushed with  heparinized saline, followed by 4 mL of heparin.  The chamber was secured to  the fascia with 3-0 Prolene.  The pocket was closed with 3-0 Biosyn.  The  skin was closed with subcuticular 4-0 Dexon.  Dressings were placed over the  outside area as well as the operative areas of both breasts.  Drain sponges  were placed.  The patient was awakened from anesthesia, transferred to her  bed, and taken to the postanesthetic care unit.                                                Dirk Dress. Katrinka Blazing, M.D.    LCS/MEDQ  D:  05/06/2003  T:  05/06/2003  Job:  [id number]

## 2011-04-08 NOTE — Procedures (Signed)
   NAME:  Emma Dawson, Emma Dawson                            ACCOUNT NO.:  [id number]   MEDICAL RECORD NO.:  [id number]                   PATIENT TYPE:  PINP   LOCATION:                                       FACILITY:  [facility]   PHYSICIAN:  Vida Roller, M.D.                DATE OF BIRTH:  1932-05-14   DATE OF PROCEDURE:  05/05/2003  DATE OF DISCHARGE:                                  ECHOCARDIOGRAM   TAPE NUMBER:  [id number]   TAPE COUNT:  [id number]   CLINICAL INFORMATION:  This is 77 year old woman with hypertension and  bilateral breast cancer who is preop.   TECHNICAL QUALITY:  Adequate.   M-MODE TRACINGS:  1. The aorta is 29 mm.  2. The left atrium is 44 mm.  3. The septum is 14 mm.  4. The posterior wall is 9 mm.  5. The left ventricular diastolic dimension is 38 mm.  6. The left ventricular systolic dimension is 26 mm.   2-D AND DOPPLER IMAGING:  1. The left ventricle is normal size with normal systolic function.  There     are no wall motion abnormalities.  There is mild concentric left     ventricular hypertrophy.  Diastolic function was not assessed.  2. The right ventricle is normal size with normal systolic function.  3. Both atria appear to be mildly enlarged.  4. The aortic valve is sclerotic and what appears to be bicuspid with trace     insufficiency.  No stenosis is seen.  5. The mitral valve is morphologically unremarkable with trace     insufficiency.  No stenosis is seen.  6. The tricuspid valve morphologically unremarkable with mild insufficiency.     No stenosis is seen.  7. The pulmonic valve is not well seen.  8. The ascending aorta and the aortic arch were not well seen.  9. The pericardial structures appear normal.  10.      The inferior vena cava appears to be normal size.                                               Vida Roller, M.D.    JH/MEDQ  D:  05/05/2003  T:  05/05/2003  Job:  [id number]

## 2011-04-19 ENCOUNTER — Other Ambulatory Visit: Payer: Self-pay | Admitting: Family Medicine

## 2011-05-06 ENCOUNTER — Encounter (HOSPITAL_COMMUNITY): Payer: Medicare Other | Attending: Oncology

## 2011-05-06 ENCOUNTER — Encounter (HOSPITAL_COMMUNITY): Payer: Medicare Other

## 2011-05-06 DIAGNOSIS — Z853 Personal history of malignant neoplasm of breast: Secondary | ICD-10-CM | POA: Insufficient documentation

## 2011-05-06 DIAGNOSIS — C50919 Malignant neoplasm of unspecified site of unspecified female breast: Secondary | ICD-10-CM

## 2011-05-06 DIAGNOSIS — Z452 Encounter for adjustment and management of vascular access device: Secondary | ICD-10-CM

## 2011-05-06 DIAGNOSIS — Z79899 Other long term (current) drug therapy: Secondary | ICD-10-CM | POA: Insufficient documentation

## 2011-05-06 DIAGNOSIS — I10 Essential (primary) hypertension: Secondary | ICD-10-CM | POA: Insufficient documentation

## 2011-05-06 DIAGNOSIS — E785 Hyperlipidemia, unspecified: Secondary | ICD-10-CM | POA: Insufficient documentation

## 2011-05-06 DIAGNOSIS — D649 Anemia, unspecified: Secondary | ICD-10-CM | POA: Insufficient documentation

## 2011-05-18 ENCOUNTER — Other Ambulatory Visit: Payer: Self-pay | Admitting: Family Medicine

## 2011-06-13 ENCOUNTER — Other Ambulatory Visit: Payer: Self-pay | Admitting: Family Medicine

## 2011-06-17 ENCOUNTER — Encounter (HOSPITAL_COMMUNITY): Payer: Medicare Other | Attending: Oncology

## 2011-06-17 DIAGNOSIS — Z452 Encounter for adjustment and management of vascular access device: Secondary | ICD-10-CM

## 2011-06-17 DIAGNOSIS — Z853 Personal history of malignant neoplasm of breast: Secondary | ICD-10-CM | POA: Insufficient documentation

## 2011-06-17 DIAGNOSIS — C50919 Malignant neoplasm of unspecified site of unspecified female breast: Secondary | ICD-10-CM

## 2011-06-17 MED ORDER — SODIUM CHLORIDE 0.9 % IJ SOLN
10.0000 mL | Freq: Once | INTRAMUSCULAR | Status: AC
  Administered 2011-06-17: 10 mL via INTRAVENOUS

## 2011-06-17 MED ORDER — HEPARIN SOD (PORK) LOCK FLUSH 100 UNIT/ML IV SOLN
INTRAVENOUS | Status: AC
  Administered 2011-06-17: 500 [IU] via INTRAVENOUS
  Filled 2011-06-17: qty 5

## 2011-06-17 MED ORDER — HEPARIN SOD (PORK) LOCK FLUSH 100 UNIT/ML IV SOLN
500.0000 [IU] | Freq: Once | INTRAVENOUS | Status: AC
  Administered 2011-06-17: 500 [IU] via INTRAVENOUS

## 2011-06-17 NOTE — Progress Notes (Signed)
Emma Dawson presented for Portacath access and flush. Proper placement of portacath confirmed by CXR. Portacath located lt chest wall accessed with  H 20 needle. Good blood return present. Portacath flushed with 20ml NS and 500U/5ml Heparin and needle removed intact. Procedure without incident. Patient tolerated procedure well.   

## 2011-06-22 ENCOUNTER — Encounter: Payer: Self-pay | Admitting: Family Medicine

## 2011-06-23 ENCOUNTER — Ambulatory Visit (INDEPENDENT_AMBULATORY_CARE_PROVIDER_SITE_OTHER): Payer: Medicare Other | Admitting: Family Medicine

## 2011-06-23 ENCOUNTER — Encounter: Payer: Self-pay | Admitting: Family Medicine

## 2011-06-23 VITALS — BP 140/70 | HR 87 | Resp 16 | Ht 62.0 in | Wt 159.8 lb

## 2011-06-23 DIAGNOSIS — J45991 Cough variant asthma: Secondary | ICD-10-CM

## 2011-06-23 DIAGNOSIS — E785 Hyperlipidemia, unspecified: Secondary | ICD-10-CM

## 2011-06-23 DIAGNOSIS — D649 Anemia, unspecified: Secondary | ICD-10-CM

## 2011-06-23 DIAGNOSIS — I10 Essential (primary) hypertension: Secondary | ICD-10-CM

## 2011-06-23 MED ORDER — HYDRALAZINE HCL 10 MG PO TABS: 10.0000 mg | ORAL_TABLET | Freq: Every day | ORAL | Status: DC

## 2011-06-23 NOTE — Patient Instructions (Addendum)
F/u in  3.5 months  No changes in your medication.  Keep eating vegetable and fruits and keep walking  Fasting labs 5 to 7 days BEFORE next visit

## 2011-07-03 NOTE — Assessment & Plan Note (Signed)
Stable, no recent flare 

## 2011-07-03 NOTE — Progress Notes (Signed)
  Subjective:    Patient ID: Emma Dawson, female    DOB: 1932/10/24, 77 y.o.   MRN: [id number]  HPI The PT is here for follow up and re-evaluation of chronic medical conditions, medication management and review of any available recent lab and radiology data.  Preventive health is updated, specifically  Cancer screening and Immunization.   Questions or concerns regarding consultations or procedures which the PT has had in the interim are  addressed. The PT denies any adverse reactions to current medications since the last visit.  There are no new concerns.  There are no specific complaints       Review of Systems Denies recent fever or chills. Denies sinus pressure, nasal congestion, ear pain or sore throat. Denies chest congestion, productive cough or wheezing. Denies chest pains, palpitations and leg swelling Denies abdominal pain, nausea, vomiting,diarrhea or constipation.   Denies dysuria, frequency, hesitancy or incontinence. c/ojoint pain, however wit no limitation in mobility. Denies headaches, seizures, numbness, or tingling. Denies depression, anxiety or insomnia. Denies skin break down or rash.        Objective:   Physical Exam Patient alert and oriented and in no cardiopulmonary distress.  HEENT: No facial asymmetry, EOMI, no sinus tenderness,  oropharynx pink and moist.  Neck supple no adenopathy.  Chest: Clear to auscultation bilaterally.  CVS: S1, S2 no murmurs, no S3.  ABD: Soft non tender. Bowel sounds normal.  Ext: No edema  UX:[id number]  ROM spine adequate in shoulders, hips and knees.  Skin: Intact, no ulcerations or rash noted.  Psych: Good eye contact, normal affect. Memory intact not anxious or depressed appearing.  CNS: CN 2-12 intact, power, tone and sensation normal throughout.        Assessment & Plan:

## 2011-07-03 NOTE — Assessment & Plan Note (Signed)
Inadequate control, low fat diet discussed and encouraged, no med change

## 2011-07-03 NOTE — Assessment & Plan Note (Signed)
Controlled, no change in medication  

## 2011-07-27 ENCOUNTER — Other Ambulatory Visit: Payer: Self-pay | Admitting: Family Medicine

## 2011-07-29 ENCOUNTER — Encounter (HOSPITAL_COMMUNITY): Payer: Medicare Other | Attending: Oncology

## 2011-07-29 DIAGNOSIS — C50919 Malignant neoplasm of unspecified site of unspecified female breast: Secondary | ICD-10-CM

## 2011-07-29 DIAGNOSIS — D649 Anemia, unspecified: Secondary | ICD-10-CM | POA: Insufficient documentation

## 2011-07-29 DIAGNOSIS — I10 Essential (primary) hypertension: Secondary | ICD-10-CM | POA: Insufficient documentation

## 2011-07-29 DIAGNOSIS — Z452 Encounter for adjustment and management of vascular access device: Secondary | ICD-10-CM

## 2011-07-29 DIAGNOSIS — Z853 Personal history of malignant neoplasm of breast: Secondary | ICD-10-CM | POA: Insufficient documentation

## 2011-07-29 DIAGNOSIS — E785 Hyperlipidemia, unspecified: Secondary | ICD-10-CM | POA: Insufficient documentation

## 2011-07-29 DIAGNOSIS — Z79899 Other long term (current) drug therapy: Secondary | ICD-10-CM | POA: Insufficient documentation

## 2011-07-29 MED ORDER — HEPARIN SOD (PORK) LOCK FLUSH 100 UNIT/ML IV SOLN
INTRAVENOUS | Status: AC
  Administered 2011-07-29: 500 [IU] via INTRAVENOUS
  Filled 2011-07-29: qty 5

## 2011-07-29 MED ORDER — HEPARIN SOD (PORK) LOCK FLUSH 100 UNIT/ML IV SOLN
500.0000 [IU] | Freq: Once | INTRAVENOUS | Status: AC
  Administered 2011-07-29: 500 [IU] via INTRAVENOUS
  Filled 2011-07-29: qty 5

## 2011-07-29 MED ORDER — SODIUM CHLORIDE 0.9 % IJ SOLN
INTRAMUSCULAR | Status: AC
  Administered 2011-07-29: 10 mL via INTRAVENOUS
  Filled 2011-07-29: qty 10

## 2011-07-29 MED ORDER — SODIUM CHLORIDE 0.9 % IJ SOLN
10.0000 mL | INTRAMUSCULAR | Status: DC | PRN
  Administered 2011-07-29: 10 mL via INTRAVENOUS
  Filled 2011-07-29: qty 10

## 2011-07-29 NOTE — Progress Notes (Signed)
Cherae W Beachem presented for Portacath access and flush. Proper placement of portacath confirmed by CXR. Portacath located left chest wall accessed with  H 20 needle. Good blood return present. Portacath flushed with 20ml NS and 500U/5ml Heparin and needle removed intact. Procedure without incident. Patient tolerated procedure well.   

## 2011-08-10 LAB — COMPREHENSIVE METABOLIC PANEL
ALT: 16
AST: 20
Alkaline Phosphatase: 52
CO2: 29
Calcium: 8.8
Chloride: 102
GFR calc Af Amer: 57 — ABNORMAL LOW
GFR calc non Af Amer: 47 — ABNORMAL LOW
Glucose, Bld: 106 — ABNORMAL HIGH
Sodium: 137
Total Bilirubin: 0.6

## 2011-08-10 LAB — CBC
HCT: 33.4 — ABNORMAL LOW
MCHC: 32.6
MCV: 80
RBC: 4.17
WBC: 8.8

## 2011-08-10 LAB — DIFFERENTIAL
Basophils Absolute: 0
Basophils Relative: 1
Eosinophils Absolute: 0.1
Eosinophils Relative: 2
Lymphs Abs: 2.1
Neutrophils Relative %: 67

## 2011-08-15 LAB — CANCER ANTIGEN 27.29: CA 27.29: 9

## 2011-08-18 LAB — URINALYSIS, ROUTINE W REFLEX MICROSCOPIC
Bilirubin Urine: NEGATIVE
Bilirubin Urine: NEGATIVE
Ketones, ur: NEGATIVE
Nitrite: NEGATIVE
Protein, ur: NEGATIVE
Urobilinogen, UA: 0.2
Urobilinogen, UA: 0.2
pH: 5.5

## 2011-08-18 LAB — CBC
HCT: 28.5 — ABNORMAL LOW
HCT: 29.5 — ABNORMAL LOW
Hemoglobin: 10.1 — ABNORMAL LOW
Hemoglobin: 9.8 — ABNORMAL LOW
MCHC: 34.2
MCV: 78.6
MCV: 79.5
RBC: 3.71 — ABNORMAL LOW
RDW: 14.4

## 2011-08-18 LAB — DIFFERENTIAL
Eosinophils Absolute: 0
Eosinophils Absolute: 0.2
Eosinophils Relative: 0
Lymphocytes Relative: 16
Lymphocytes Relative: 18
Lymphs Abs: 1.4
Lymphs Abs: 1.8
Monocytes Absolute: 0.5
Neutro Abs: 7.1
Neutrophils Relative %: 70

## 2011-08-18 LAB — COMPREHENSIVE METABOLIC PANEL
ALT: 26
BUN: 26 — ABNORMAL HIGH
CO2: 25
Calcium: 9.3
Creatinine, Ser: 1.58 — ABNORMAL HIGH
GFR calc non Af Amer: 32 — ABNORMAL LOW
Glucose, Bld: 127 — ABNORMAL HIGH

## 2011-08-18 LAB — POCT CARDIAC MARKERS
CKMB, poc: 3.2
Myoglobin, poc: 233
Operator id: 207241
Troponin i, poc: <0.05

## 2011-08-18 LAB — CULTURE, BLOOD (ROUTINE X 2)
Culture: NO GROWTH
Culture: NO GROWTH
Report Status: 6082009
Report Status: 6082009

## 2011-08-18 LAB — LIPID PANEL
HDL: 29 — ABNORMAL LOW
LDL Cholesterol: 72
Triglycerides: 166 — ABNORMAL HIGH

## 2011-08-18 LAB — BASIC METABOLIC PANEL
BUN: 26 — ABNORMAL HIGH
Chloride: 97
GFR calc non Af Amer: 28 — ABNORMAL LOW
Glucose, Bld: 144 — ABNORMAL HIGH
Potassium: 4.4
Sodium: 130 — ABNORMAL LOW

## 2011-08-18 LAB — URINE CULTURE

## 2011-08-18 LAB — TROPONIN I: Troponin I: 0.02

## 2011-08-18 LAB — URINE MICROSCOPIC-ADD ON

## 2011-08-18 LAB — D-DIMER, QUANTITATIVE: D-Dimer, Quant: 1.24 — ABNORMAL HIGH

## 2011-08-18 LAB — TSH: TSH: 1.23

## 2011-08-18 LAB — CARDIAC PANEL(CRET KIN+CKTOT+MB+TROPI): Relative Index: 1.6

## 2011-08-19 LAB — CANCER ANTIGEN 27.29: CA 27.29: 14

## 2011-08-22 LAB — URINALYSIS, ROUTINE W REFLEX MICROSCOPIC
Nitrite: NEGATIVE
Specific Gravity, Urine: 1.02
Urobilinogen, UA: 0.2
pH: 6

## 2011-08-22 LAB — URINE MICROSCOPIC-ADD ON

## 2011-09-05 LAB — CBC
HCT: 31.6 — ABNORMAL LOW
Hemoglobin: 10.4 — ABNORMAL LOW
MCHC: 33
MCV: 80.4
Platelets: 307
RDW: 14.3 — ABNORMAL HIGH

## 2011-09-05 LAB — DIFFERENTIAL
Basophils Relative: 1
Lymphocytes Relative: 29
Monocytes Relative: 9
Neutro Abs: 4.2
Neutrophils Relative %: 58

## 2011-09-05 LAB — COMPREHENSIVE METABOLIC PANEL
Alkaline Phosphatase: 43
BUN: 18
Calcium: 9.3
Creatinine, Ser: 1.08
Glucose, Bld: 102 — ABNORMAL HIGH
Potassium: 3.6
Total Protein: 7.2

## 2011-09-07 LAB — CBC
Hemoglobin: 10.3 — ABNORMAL LOW
MCV: 79.9
RBC: 3.91
WBC: 7.4

## 2011-09-09 ENCOUNTER — Encounter (HOSPITAL_COMMUNITY): Payer: Medicare Other | Attending: Oncology

## 2011-09-09 DIAGNOSIS — Z452 Encounter for adjustment and management of vascular access device: Secondary | ICD-10-CM

## 2011-09-09 DIAGNOSIS — Z853 Personal history of malignant neoplasm of breast: Secondary | ICD-10-CM | POA: Insufficient documentation

## 2011-09-09 DIAGNOSIS — C50919 Malignant neoplasm of unspecified site of unspecified female breast: Secondary | ICD-10-CM

## 2011-09-09 MED ORDER — HEPARIN SOD (PORK) LOCK FLUSH 100 UNIT/ML IV SOLN
INTRAVENOUS | Status: AC
  Administered 2011-09-09: 500 [IU] via INTRAVENOUS
  Filled 2011-09-09: qty 5

## 2011-09-09 MED ORDER — HEPARIN SOD (PORK) LOCK FLUSH 100 UNIT/ML IV SOLN
500.0000 [IU] | Freq: Once | INTRAVENOUS | Status: AC
  Administered 2011-09-09: 500 [IU] via INTRAVENOUS
  Filled 2011-09-09: qty 5

## 2011-09-09 MED ORDER — SODIUM CHLORIDE 0.9 % IJ SOLN
INTRAMUSCULAR | Status: AC
  Administered 2011-09-09: 10 mL via INTRAVENOUS
  Filled 2011-09-09: qty 10

## 2011-09-09 MED ORDER — SODIUM CHLORIDE 0.9 % IJ SOLN
10.0000 mL | INTRAMUSCULAR | Status: DC | PRN
  Administered 2011-09-09: 10 mL via INTRAVENOUS
  Filled 2011-09-09: qty 10

## 2011-09-09 NOTE — Progress Notes (Signed)
Emma Dawson presented for Portacath access and flush. Proper placement of portacath confirmed by CXR. Portacath located left chest wall accessed with  H 20 needle. Good blood return present. Portacath flushed with 20ml NS and 500U/5ml Heparin and needle removed intact. Procedure without incident. Patient tolerated procedure well.   

## 2011-09-17 ENCOUNTER — Other Ambulatory Visit: Payer: Self-pay | Admitting: Family Medicine

## 2011-10-21 ENCOUNTER — Encounter (HOSPITAL_COMMUNITY): Payer: Medicare Other | Attending: Oncology

## 2011-10-21 DIAGNOSIS — C50919 Malignant neoplasm of unspecified site of unspecified female breast: Secondary | ICD-10-CM

## 2011-10-21 DIAGNOSIS — Z452 Encounter for adjustment and management of vascular access device: Secondary | ICD-10-CM

## 2011-10-21 MED ORDER — HEPARIN SOD (PORK) LOCK FLUSH 100 UNIT/ML IV SOLN
500.0000 [IU] | Freq: Once | INTRAVENOUS | Status: AC
  Administered 2011-10-21: 500 [IU] via INTRAVENOUS
  Filled 2011-10-21: qty 5

## 2011-10-21 MED ORDER — HEPARIN SOD (PORK) LOCK FLUSH 100 UNIT/ML IV SOLN
INTRAVENOUS | Status: AC
  Filled 2011-10-21: qty 5

## 2011-10-21 MED ORDER — SODIUM CHLORIDE 0.9 % IJ SOLN
INTRAMUSCULAR | Status: AC
  Filled 2011-10-21: qty 10

## 2011-10-21 MED ORDER — SODIUM CHLORIDE 0.9 % IJ SOLN
10.0000 mL | INTRAMUSCULAR | Status: DC | PRN
  Administered 2011-10-21: 10 mL via INTRAVENOUS
  Filled 2011-10-21: qty 10

## 2011-10-21 NOTE — Progress Notes (Signed)
Emma Dawson presented for Portacath access and flush. Proper placement of portacath confirmed by CXR. Portacath located left chest wall accessed with  H 20 needle. Good blood return present. Portacath flushed with 20ml NS and 500U/5ml Heparin and needle removed intact. Procedure without incident. Patient tolerated procedure well.   

## 2011-10-22 LAB — CBC WITH DIFFERENTIAL/PLATELET
Eosinophils Absolute: 0.5 10*3/uL (ref 0.0–0.7)
HCT: 36 % (ref 36.0–46.0)
Hemoglobin: 11.3 g/dL — ABNORMAL LOW (ref 12.0–15.0)
Lymphs Abs: 2.6 10*3/uL (ref 0.7–4.0)
MCH: 25.5 pg — ABNORMAL LOW (ref 26.0–34.0)
MCHC: 31.4 g/dL (ref 30.0–36.0)
Monocytes Absolute: 0.6 10*3/uL (ref 0.1–1.0)
Monocytes Relative: 7 % (ref 3–12)
Neutro Abs: 4.8 10*3/uL (ref 1.7–7.7)
Neutrophils Relative %: 57 % (ref 43–77)
RBC: 4.43 MIL/uL (ref 3.87–5.11)

## 2011-10-22 LAB — LIPID PANEL
HDL: 56 mg/dL (ref >39)
LDL Cholesterol: 110 mg/dL — ABNORMAL HIGH (ref 0–99)
Total CHOL/HDL Ratio: 3.5 Ratio
Triglycerides: 148 mg/dL (ref <150)
VLDL: 30 mg/dL (ref 0–40)

## 2011-10-22 LAB — HEPATIC FUNCTION PANEL
Alkaline Phosphatase: 43 U/L (ref 39–117)
Indirect Bilirubin: 0.4 mg/dL (ref 0.0–0.9)
Total Bilirubin: 0.5 mg/dL (ref 0.3–1.2)
Total Protein: 7.7 g/dL (ref 6.0–8.3)

## 2011-10-22 LAB — BASIC METABOLIC PANEL
CO2: 26 mEq/L (ref 19–32)
Calcium: 9.6 mg/dL (ref 8.4–10.5)
Creat: 1.24 mg/dL — ABNORMAL HIGH (ref 0.50–1.10)
Sodium: 140 mEq/L (ref 135–145)

## 2011-10-28 ENCOUNTER — Encounter: Payer: Self-pay | Admitting: Family Medicine

## 2011-11-01 ENCOUNTER — Encounter: Payer: Self-pay | Admitting: Cardiology

## 2011-11-03 ENCOUNTER — Ambulatory Visit: Payer: Medicare Other | Admitting: Family Medicine

## 2011-11-14 ENCOUNTER — Other Ambulatory Visit: Payer: Self-pay | Admitting: Family Medicine

## 2011-11-21 ENCOUNTER — Encounter: Payer: Self-pay | Admitting: Family Medicine

## 2011-11-28 ENCOUNTER — Ambulatory Visit (INDEPENDENT_AMBULATORY_CARE_PROVIDER_SITE_OTHER): Payer: Medicare Other | Admitting: Family Medicine

## 2011-11-28 ENCOUNTER — Encounter: Payer: Self-pay | Admitting: Family Medicine

## 2011-11-28 VITALS — BP 130/70 | HR 81 | Resp 18 | Ht 62.0 in | Wt 158.1 lb

## 2011-11-28 DIAGNOSIS — R5383 Other fatigue: Secondary | ICD-10-CM

## 2011-11-28 DIAGNOSIS — I6529 Occlusion and stenosis of unspecified carotid artery: Secondary | ICD-10-CM

## 2011-11-28 DIAGNOSIS — I10 Essential (primary) hypertension: Secondary | ICD-10-CM

## 2011-11-28 DIAGNOSIS — J45991 Cough variant asthma: Secondary | ICD-10-CM | POA: Diagnosis not present

## 2011-11-28 DIAGNOSIS — E785 Hyperlipidemia, unspecified: Secondary | ICD-10-CM | POA: Diagnosis not present

## 2011-11-28 NOTE — Assessment & Plan Note (Signed)
Controlled, no change in medication  

## 2011-11-28 NOTE — Patient Instructions (Signed)
F/u in early June.  No med changes.  Fasting lipid, chem 7, hepatic, tSh,  End May

## 2011-11-28 NOTE — Assessment & Plan Note (Signed)
Asymptomatic at this time, but rept imaging needed , will discuss at next visit to see if she will agree to this

## 2011-11-28 NOTE — Progress Notes (Signed)
  Subjective:    Patient ID: Emma Dawson, female    DOB: [birth year], 78 y.o.   MRN: [id number]  HPI The PT is here for follow up and re-evaluation of chronic medical conditions, medication management and review of any available recent lab and radiology data.  Preventive health is updated, specifically  Cancer screening and Immunization.  Refuses all cancer screening tests Questions or concerns regarding consultations or procedures which the PT has had in the interim are  addressed. The PT denies any adverse reactions to current medications since the last visit.  There are no new concerns.  There are no specific complaints       Review of Systems    See HPI Denies recent fever or chills. Denies sinus pressure, nasal congestion, ear pain or sore throat. Denies chest congestion, productive cough or wheezing. Denies chest pains, palpitations and leg swelling Denies abdominal pain, nausea, vomiting,diarrhea or constipation.   Denies dysuria, frequency, hesitancy or incontinence. Denies joint pain, swelling and limitation in mobility. Denies headaches, seizures, numbness, or tingling. Denies depression, anxiety or insomnia. Denies skin break down or rash.     Objective:   Physical Exam Patient alert and oriented and in no cardiopulmonary distress.  HEENT: No facial asymmetry, EOMI, no sinus tenderness,  oropharynx pink and moist.  Neck supple no adenopathy.  Chest: Clear to auscultation bilaterally.Decreased air entry throughout  CVS: S1, S2 no murmurs, no S3.  ABD: Soft non tender. Bowel sounds normal.  Ext: No edema  JX:[id number] though  Adequate ROM spine, shoulders, hips and knees.  Skin: Intact, no ulcerations or rash noted. Psych: Good eye contact, normal affect. Memory mildly impaired  intact not anxious or depressed appearing.  CNS: CN 2-12 intact, power, tone and sensation normal throughout.        Assessment & Plan:

## 2011-11-29 MED ORDER — LOSARTAN POTASSIUM-HCTZ 100-25 MG PO TABS: 1.0000 | ORAL_TABLET | Freq: Every day | ORAL | Status: DC

## 2011-11-29 MED ORDER — AMLODIPINE BESYLATE 10 MG PO TABS: 10.0000 mg | ORAL_TABLET | Freq: Every day | ORAL | Status: DC

## 2011-11-29 MED ORDER — HYDRALAZINE HCL 10 MG PO TABS: 10.0000 mg | ORAL_TABLET | Freq: Every day | ORAL | Status: DC

## 2011-11-29 MED ORDER — ROSUVASTATIN CALCIUM 20 MG PO TABS: 20.0000 mg | ORAL_TABLET | Freq: Every day | ORAL | Status: DC

## 2011-11-29 NOTE — Progress Notes (Signed)
Addended by: Kandis Fantasia B on: 11/29/2011 09:44 AM   Modules accepted: Orders, Medications

## 2011-12-02 ENCOUNTER — Other Ambulatory Visit: Payer: Self-pay | Admitting: Family Medicine

## 2011-12-02 ENCOUNTER — Encounter (HOSPITAL_COMMUNITY): Payer: Medicare Other

## 2011-12-13 ENCOUNTER — Encounter (HOSPITAL_COMMUNITY): Payer: Medicare Other

## 2011-12-22 ENCOUNTER — Encounter (HOSPITAL_COMMUNITY): Payer: Medicare Other | Attending: Oncology

## 2011-12-22 DIAGNOSIS — Z452 Encounter for adjustment and management of vascular access device: Secondary | ICD-10-CM

## 2011-12-22 DIAGNOSIS — Z853 Personal history of malignant neoplasm of breast: Secondary | ICD-10-CM | POA: Diagnosis not present

## 2011-12-22 MED ORDER — HEPARIN SOD (PORK) LOCK FLUSH 100 UNIT/ML IV SOLN
500.0000 [IU] | Freq: Once | INTRAVENOUS | Status: AC
  Administered 2011-12-22: 500 [IU] via INTRAVENOUS
  Filled 2011-12-22: qty 5

## 2011-12-22 MED ORDER — HEPARIN SOD (PORK) LOCK FLUSH 100 UNIT/ML IV SOLN
INTRAVENOUS | Status: AC
  Administered 2011-12-22: 500 [IU] via INTRAVENOUS
  Filled 2011-12-22: qty 5

## 2011-12-22 MED ORDER — SODIUM CHLORIDE 0.9 % IJ SOLN
INTRAMUSCULAR | Status: AC
  Administered 2011-12-22: 10 mL via INTRAVENOUS
  Filled 2011-12-22: qty 10

## 2011-12-22 MED ORDER — SODIUM CHLORIDE 0.9 % IJ SOLN
10.0000 mL | INTRAMUSCULAR | Status: DC | PRN
  Administered 2011-12-22: 10 mL via INTRAVENOUS
  Filled 2011-12-22: qty 10

## 2011-12-22 NOTE — Progress Notes (Signed)
Daryl W Mcdaniel presented for Portacath access and flush. Proper placement of portacath confirmed by CXR. Portacath located left chest wall accessed with  H 20 needle. Good blood return present. Portacath flushed with 20ml NS and 500U/5ml Heparin and needle removed intact. Procedure without incident. Patient tolerated procedure well.   

## 2012-01-13 ENCOUNTER — Encounter (HOSPITAL_COMMUNITY): Payer: Medicare Other

## 2012-02-02 ENCOUNTER — Encounter (HOSPITAL_COMMUNITY): Payer: Medicare Other | Attending: Oncology

## 2012-02-02 DIAGNOSIS — Z452 Encounter for adjustment and management of vascular access device: Secondary | ICD-10-CM | POA: Diagnosis not present

## 2012-02-02 DIAGNOSIS — Z853 Personal history of malignant neoplasm of breast: Secondary | ICD-10-CM | POA: Diagnosis not present

## 2012-02-02 DIAGNOSIS — C50919 Malignant neoplasm of unspecified site of unspecified female breast: Secondary | ICD-10-CM | POA: Insufficient documentation

## 2012-02-02 MED ORDER — HEPARIN SOD (PORK) LOCK FLUSH 100 UNIT/ML IV SOLN
500.0000 [IU] | Freq: Once | INTRAVENOUS | Status: AC
  Administered 2012-02-02: 500 [IU] via INTRAVENOUS
  Filled 2012-02-02: qty 5

## 2012-02-02 MED ORDER — HEPARIN SOD (PORK) LOCK FLUSH 100 UNIT/ML IV SOLN
INTRAVENOUS | Status: AC
  Administered 2012-02-02: 500 [IU] via INTRAVENOUS
  Filled 2012-02-02: qty 5

## 2012-02-02 MED ORDER — SODIUM CHLORIDE 0.9 % IJ SOLN
10.0000 mL | INTRAMUSCULAR | Status: DC | PRN
  Administered 2012-02-02: 10 mL via INTRAVENOUS
  Filled 2012-02-02: qty 10

## 2012-02-02 NOTE — Progress Notes (Signed)
Emma Dawson presented for Portacath access and flush. Proper placement of portacath confirmed by CXR. Portacath located left chest wall accessed with  H 20 needle. Good blood return present. Portacath flushed with 20ml NS and 500U/5ml Heparin and needle removed intact. Procedure without incident. Patient tolerated procedure well.   

## 2012-02-24 ENCOUNTER — Encounter (HOSPITAL_COMMUNITY): Payer: Medicare Other

## 2012-02-27 ENCOUNTER — Other Ambulatory Visit: Payer: Self-pay | Admitting: Family Medicine

## 2012-02-27 DIAGNOSIS — I10 Essential (primary) hypertension: Secondary | ICD-10-CM | POA: Diagnosis not present

## 2012-02-27 DIAGNOSIS — E785 Hyperlipidemia, unspecified: Secondary | ICD-10-CM | POA: Diagnosis not present

## 2012-02-27 DIAGNOSIS — R5381 Other malaise: Secondary | ICD-10-CM | POA: Diagnosis not present

## 2012-02-28 LAB — LIPID PANEL
Cholesterol: 211 mg/dL — ABNORMAL HIGH (ref 0–200)
Total CHOL/HDL Ratio: 3.6 Ratio
Triglycerides: 168 mg/dL — ABNORMAL HIGH (ref <150)
VLDL: 34 mg/dL (ref 0–40)

## 2012-02-28 LAB — CMP AND LIVER
AST: 22 U/L (ref 0–37)
Alkaline Phosphatase: 45 U/L (ref 39–117)
BUN: 23 mg/dL (ref 6–23)
Creat: 1.22 mg/dL — ABNORMAL HIGH (ref 0.50–1.10)
Glucose, Bld: 93 mg/dL (ref 70–99)
Indirect Bilirubin: 0.4 mg/dL (ref 0.0–0.9)
Total Bilirubin: 0.5 mg/dL (ref 0.3–1.2)

## 2012-02-29 ENCOUNTER — Ambulatory Visit: Payer: Medicare Other | Admitting: Family Medicine

## 2012-03-07 ENCOUNTER — Ambulatory Visit (INDEPENDENT_AMBULATORY_CARE_PROVIDER_SITE_OTHER): Payer: Medicare Other | Admitting: Family Medicine

## 2012-03-07 ENCOUNTER — Encounter: Payer: Self-pay | Admitting: Family Medicine

## 2012-03-07 VITALS — BP 134/72 | HR 87 | Resp 18 | Ht 62.0 in | Wt 165.0 lb

## 2012-03-07 DIAGNOSIS — I10 Essential (primary) hypertension: Secondary | ICD-10-CM

## 2012-03-07 DIAGNOSIS — E785 Hyperlipidemia, unspecified: Secondary | ICD-10-CM

## 2012-03-07 DIAGNOSIS — R0789 Other chest pain: Secondary | ICD-10-CM | POA: Diagnosis not present

## 2012-03-07 DIAGNOSIS — Z853 Personal history of malignant neoplasm of breast: Secondary | ICD-10-CM | POA: Diagnosis not present

## 2012-03-07 NOTE — Patient Instructions (Addendum)
F/u in 4 month  Please cut back on fried and fatty foods, your cholesterol is too high.  Your EKG is normal, no sign of heart damage  Please reconsider getting mammograms .  I am sorry about the loss of your sister

## 2012-03-07 NOTE — Progress Notes (Signed)
  Subjective:    Patient ID: Emma Dawson, female    DOB: [birth year], 78 y.o.   MRN: [id number]  HPI The PT is here for follow up of uncontrolled blood pressure and re-evaluation of chronic medical conditions, medication management and review of any available recent lab and radiology data.  Preventive health is updated, specifically  Cancer screening and Immunization. Still refusing screening  Questions or concerns regarding consultations or procedures which the PT has had in the interim are  addressed. The PT denies any adverse reactions to current medications since the last visit.  Concerned about recent unexpected death of her sister from a heart attack and wants her heart checked. Has intermittent chest pain both at rest and with activity, fleeting , just since the event likely anxiety related. Non radiating, no associated light headedness, nausea, diaphoresis or fatigue. She continues to be very active      Review of Systems See HPI Denies recent fever or chills. Denies sinus pressure, nasal congestion, ear pain or sore throat. Denies chest congestion, productive cough or wheezing. Denies PND, orthopnea, palpitations and leg swelling Denies abdominal pain, nausea, vomiting,diarrhea or constipation.   Denies dysuria, frequency, hesitancy or incontinence. Denies joint pain, swelling and limitation in mobility. Denies headaches, seizures, numbness, or tingling. Denies depression,does have some  anxiety denies  insomnia. Denies skin break down or rash.        Objective:   Physical Exam  Patient alert and oriented and in no cardiopulmonary distress.  HEENT: No facial asymmetry, EOMI, no sinus tenderness,  oropharynx pink and moist.  Neck supple no adenopathy.  Chest: Clear to auscultation bilaterally.No reproducible chest wall pain CVS: S1, S2 no murmurs, no S3.  ABD: Soft non tender. Bowel sounds normal.  Ext: No edema  MS: Adequate ROM spine, shoulders, hips and  knees.  Skin: Intact, no ulcerations or rash noted.  Psych: Good eye contact, normal affect. Memory intact not anxious or depressed appearing.  CNS: CN 2-12 intact, power, tone and sensation normal throughout.       Assessment & Plan:

## 2012-03-11 NOTE — Assessment & Plan Note (Signed)
Sees oncology anually , but refuses mammogram

## 2012-03-11 NOTE — Assessment & Plan Note (Signed)
Increased anxiety following recent unexpected death of her sibling from MI, EKG in office normal , no ischemic changes, pt reassured and encouraged too continue healthy lifestyle

## 2012-03-11 NOTE — Assessment & Plan Note (Signed)
Controlled, no change in medication  

## 2012-03-11 NOTE — Assessment & Plan Note (Signed)
Hyperlipidemia:Low fat diet discussed and encouraged.  Currently uncontrolled when last checked, updated lab needed

## 2012-03-14 ENCOUNTER — Encounter: Payer: Self-pay | Admitting: Family Medicine

## 2012-03-15 ENCOUNTER — Other Ambulatory Visit (HOSPITAL_COMMUNITY): Payer: Medicare Other

## 2012-03-23 ENCOUNTER — Encounter (HOSPITAL_COMMUNITY): Payer: Medicare Other

## 2012-03-23 ENCOUNTER — Encounter (HOSPITAL_COMMUNITY): Payer: Self-pay | Admitting: Oncology

## 2012-03-23 ENCOUNTER — Ambulatory Visit (HOSPITAL_COMMUNITY): Payer: Medicare Other | Admitting: Oncology

## 2012-03-23 ENCOUNTER — Encounter (HOSPITAL_COMMUNITY): Payer: Medicare Other | Attending: Oncology | Admitting: Oncology

## 2012-03-23 VITALS — BP 148/69 | HR 79 | Temp 97.8°F | Ht 62.0 in | Wt 162.1 lb

## 2012-03-23 DIAGNOSIS — Z171 Estrogen receptor negative status [ER-]: Secondary | ICD-10-CM

## 2012-03-23 DIAGNOSIS — I878 Other specified disorders of veins: Secondary | ICD-10-CM

## 2012-03-23 DIAGNOSIS — Z853 Personal history of malignant neoplasm of breast: Secondary | ICD-10-CM | POA: Diagnosis not present

## 2012-03-23 DIAGNOSIS — I998 Other disorder of circulatory system: Secondary | ICD-10-CM | POA: Insufficient documentation

## 2012-03-23 DIAGNOSIS — C50919 Malignant neoplasm of unspecified site of unspecified female breast: Secondary | ICD-10-CM

## 2012-03-23 MED ORDER — HEPARIN SOD (PORK) LOCK FLUSH 100 UNIT/ML IV SOLN
500.0000 [IU] | Freq: Once | INTRAVENOUS | Status: AC
  Administered 2012-03-23: 500 [IU] via INTRAVENOUS
  Filled 2012-03-23: qty 5

## 2012-03-23 MED ORDER — SODIUM CHLORIDE 0.9 % IJ SOLN
INTRAMUSCULAR | Status: AC
  Filled 2012-03-23: qty 10

## 2012-03-23 MED ORDER — HEPARIN SOD (PORK) LOCK FLUSH 100 UNIT/ML IV SOLN
INTRAVENOUS | Status: AC
  Filled 2012-03-23: qty 5

## 2012-03-23 MED ORDER — SODIUM CHLORIDE 0.9 % IJ SOLN
10.0000 mL | INTRAMUSCULAR | Status: AC | PRN
  Administered 2012-03-23: 10 mL via INTRAVENOUS
  Filled 2012-03-23: qty 10

## 2012-03-23 NOTE — Patient Instructions (Signed)
EMERALD SHOR  [id number] 1932/08/03 Dr. Glenford Peers   [facility]  Discharge Instructions  RECOMMENDATIONS MADE BY THE CONSULTANT AND ANY TEST RESULTS WILL BE SENT TO YOUR REFERRING DOCTOR.   EXAM FINDINGS BY MD TODAY AND SIGNS AND SYMPTOMS TO REPORT TO CLINIC OR PRIMARY MD: you are doing well.  Report any lumps, bone pain or shortness of breath.  MEDICATIONS PRESCRIBED: none      SPECIAL INSTRUCTIONS/FOLLOW-UP: Return to Clinic for port flushes every 6 weeks and for follow-up in 1 year.   I acknowledge that I have been informed and understand all the instructions given to me and received a copy. I do not have any more questions at this time, but understand that I may call the [facility]ty [facility] at [facility] at [phone] during business hours should I have any further questions or need assistance in obtaining follow-up care.    __________________________________________  _____________  __________ Signature of Patient or Authorized Representative            Date                   Time    __________________________________________ Nurse's Signature

## 2012-03-23 NOTE — Progress Notes (Signed)
Emma Dawson presented for Portacath access and flush. Proper placement of portacath confirmed by CXR. Portacath located left chest wall accessed with  H 20 needle. Good blood return present. Portacath flushed with 20ml NS and 500U/5ml Heparin and needle removed intact. Procedure without incident. Patient tolerated procedure well.   

## 2012-03-23 NOTE — Progress Notes (Signed)
Emma Overman, MD, MD [address] [place] [address]  1. Invasive ductal carcinoma of breast     CURRENT THERAPY: Observation  INTERVAL HISTORY: Emma Dawson 78 y.o. female returns for  regular  visit for followup of Stage IIIB advanced invasive ductal carcinoma of the right breast presenting with a T4 B. N1 M0 cancer that was 6 cm in size with 8/9 lymph nodes positive for metastatic disease. The tumor was HER-2/neu positive, ER/PR negative. She is status post modified radical mastectomy on 05/06/2003 by Dr. Elpidio Anis. She then underwent adjuvant chemotherapy consisting of Adriamycin and Cytoxan for 6 cycles followed by 4 cycles of Taxotere. She is also status post 48 weeks of Herceptin therapy. She also received radiation to the chest wall. She completed all her therapy on 12/30/2004 and a followup PET scan in June 2006 revealed no evidence of disease.  The patient denies any complaints.  She will have her port flushed today.  She wishes to have her port removed and we will make those arrangements. She needs an Rx for mastectomy supplies.  This Rx will be provided to her today.   The patient denies any fevers, chills, night sweats, change in appetite, weight loss, change in her breast, nipple discharge, breast nodules, or mass.  She explains that she is doing very well and does not pose any questions or complaints. She sees her PCP regularly, Dr. Lodema Hong, who recently performed lab work and this is noted in [facility].  Past Medical History  Diagnosis Date  . Normocytic anemia   . History of chemotherapy     And radiation secondary to breast cancer  . Osteoarthritis   . Hypertension   . Hyperlipidemia   . History of breast cancer   . Allergic rhinitis     has GENITAL HERPES; TINEA VERSICOLOR; HYPERLIPIDEMIA; ANEMIA, NORMOCYTIC; HYPERTENSION; CAROTID ARTERY STENOSIS, LEFT; ALLERGIC RHINITIS; COUGH VARIANT ASTHMA; ECZEMA; OSTEOARTHRITIS; SYNCOPE, VASOVAGAL; FATIGUE; CARDIAC  MURMUR; Invasive ductal carcinoma of breast; and Chest pain, atypical on her problem list.      has no known allergies.  Ms. Lanpher does not currently have medications on file.  Past Surgical History  Procedure Date  . Abdominal hysterectomy 1970    Fibroid tumors, benign  . Mastectomy 2004    Right    Denies any headaches, dizziness, double vision, fevers, chills, night sweats, nausea, vomiting, diarrhea, constipation, chest pain, heart palpitations, shortness of breath, blood in stool, black tarry stool, urinary pain, urinary burning, urinary frequency, hematuria.   PHYSICAL EXAMINATION  ECOG PERFORMANCE STATUS: 0 - Asymptomatic  Filed Vitals:   03/23/12 0842  BP: 148/69  Pulse: 79  Temp: 97.8 F (36.6 C)    GENERAL:alert, healthy, no distress, well nourished, well developed, comfortable, cooperative, obese and smiling SKIN: skin color, texture, turgor are normal, positive for:SKs HEAD: Normocephalic, No masses, lesions, tenderness or abnormalities EYES: normal, EOMI, Conjunctiva are pink and non-injected EARS: External ears normal OROPHARYNX:lips, buccal mucosa, and tongue normal and mucous membranes are moist  NECK: supple, thyroid normal size, non-tender, without nodularity, no stridor, non-tender, trachea midline, small B/L submandibular/angle of the jaw lymph nodes noted measuring < 1 cm that are soft and not fixed. LYMPH:  no hepatosplenomegaly BREAST:left breast normal without mass, skin or nipple changes or axillary nodes, fibroglandular tissue appreciated particularly inferior to areola. Right post-mastectomy site well healed and free of suspicious changes. LUNGS: clear to auscultation with minimal wheezes noted in the right lower field and clear  to percussion HEART: regular rate & rhythm, no murmurs, no gallops, S1 normal and S2 normal ABDOMEN:abdomen soft, non-tender, obese, normal bowel sounds, no masses or organomegaly and no hepatosplenomegaly BACK: Back  symmetric, no curvature., No CVA tenderness EXTREMITIES:less then 2 second capillary refill, no joint deformities, effusion, or inflammation, no edema, no skin discoloration, no clubbing, no cyanosis  NEURO: alert & oriented x 3 with fluent speech, no focal motor/sensory deficits, gait normal    ASSESSMENT:  1.  Stage IIIB advanced invasive ductal carcinoma of the right breast presenting with a T4 B. N1 M0 cancer that was 6 cm in size with 8/9 lymph nodes positive for metastatic disease. The tumor was HER-2/neu positive, ER/PR negative. She is status post modified radical mastectomy on 05/06/2003 by Dr. Elpidio Anis. She then underwent adjuvant chemotherapy consisting of Adriamycin and Cytoxan for 6 cycles followed by 4 cycles of Taxotere. She is also status post 48 weeks of Herceptin therapy. She also received radiation to the chest wall. She completed all her therapy on 12/30/2004 and a followup PET scan in June 2006 revealed no evidence of disease.   PLAN:  1. Rx for Mastectomy bras and mastectomy supplies. 2. Referral to Gen surgery for port-a-cath removal. 3. I personally reviewed and went over laboratory results with the patient. 4. Port flush today. 5. Return in 1 year for follow-up.   All questions were answered. The patient knows to call the clinic with any problems, questions or concerns. We can certainly see the patient much sooner if necessary.   Criag Wicklund

## 2012-05-01 ENCOUNTER — Ambulatory Visit: Payer: Medicare Other | Admitting: Family Medicine

## 2012-05-04 ENCOUNTER — Ambulatory Visit: Payer: Medicare Other | Admitting: Family Medicine

## 2012-05-04 ENCOUNTER — Other Ambulatory Visit (HOSPITAL_COMMUNITY): Payer: Medicare Other

## 2012-05-08 ENCOUNTER — Ambulatory Visit (INDEPENDENT_AMBULATORY_CARE_PROVIDER_SITE_OTHER): Payer: Medicare Other | Admitting: Family Medicine

## 2012-05-08 ENCOUNTER — Encounter: Payer: Self-pay | Admitting: Family Medicine

## 2012-05-08 ENCOUNTER — Encounter (HOSPITAL_COMMUNITY): Payer: Medicare Other | Attending: Oncology

## 2012-05-08 VITALS — BP 144/60 | HR 90 | Resp 16 | Ht 62.0 in | Wt 163.8 lb

## 2012-05-08 DIAGNOSIS — Z452 Encounter for adjustment and management of vascular access device: Secondary | ICD-10-CM | POA: Diagnosis not present

## 2012-05-08 DIAGNOSIS — A6000 Herpesviral infection of urogenital system, unspecified: Secondary | ICD-10-CM

## 2012-05-08 DIAGNOSIS — M1990 Unspecified osteoarthritis, unspecified site: Secondary | ICD-10-CM

## 2012-05-08 DIAGNOSIS — E785 Hyperlipidemia, unspecified: Secondary | ICD-10-CM | POA: Diagnosis not present

## 2012-05-08 DIAGNOSIS — I10 Essential (primary) hypertension: Secondary | ICD-10-CM

## 2012-05-08 DIAGNOSIS — C50919 Malignant neoplasm of unspecified site of unspecified female breast: Secondary | ICD-10-CM | POA: Diagnosis not present

## 2012-05-08 MED ORDER — SODIUM CHLORIDE 0.9 % IJ SOLN
10.0000 mL | INTRAMUSCULAR | Status: DC | PRN
  Administered 2012-05-08: 10 mL via INTRAVENOUS
  Filled 2012-05-08: qty 10

## 2012-05-08 MED ORDER — ROSUVASTATIN CALCIUM 20 MG PO TABS: 20.0000 mg | ORAL_TABLET | Freq: Every day | ORAL | Status: DC

## 2012-05-08 MED ORDER — SODIUM CHLORIDE 0.9 % IJ SOLN
INTRAMUSCULAR | Status: AC
  Filled 2012-05-08: qty 10

## 2012-05-08 MED ORDER — AMLODIPINE BESYLATE 10 MG PO TABS: 10.0000 mg | ORAL_TABLET | Freq: Every day | ORAL | Status: DC

## 2012-05-08 MED ORDER — HEPARIN SOD (PORK) LOCK FLUSH 100 UNIT/ML IV SOLN
INTRAVENOUS | Status: AC
  Filled 2012-05-08: qty 5

## 2012-05-08 MED ORDER — HEPARIN SOD (PORK) LOCK FLUSH 100 UNIT/ML IV SOLN
500.0000 [IU] | Freq: Once | INTRAVENOUS | Status: AC
  Administered 2012-05-08: 500 [IU] via INTRAVENOUS
  Filled 2012-05-08: qty 5

## 2012-05-08 MED ORDER — HYDRALAZINE HCL 10 MG PO TABS: 10.0000 mg | ORAL_TABLET | Freq: Every day | ORAL | Status: DC

## 2012-05-08 MED ORDER — LOSARTAN POTASSIUM-HCTZ 100-25 MG PO TABS: 1.0000 | ORAL_TABLET | Freq: Every day | ORAL | Status: DC

## 2012-05-08 MED ORDER — ACETAMINOPHEN 500 MG PO TABS: ORAL_TABLET | ORAL | Status: AC

## 2012-05-08 NOTE — Progress Notes (Signed)
Janaisha W Fisk presented for Portacath access and flush. Proper placement of portacath confirmed by CXR. Portacath located left chest wall accessed with  H 20 needle. Good blood return present. Portacath flushed with 20ml NS and 500U/5ml Heparin and needle removed intact. Procedure without incident. Patient tolerated procedure well.   

## 2012-05-08 NOTE — Progress Notes (Signed)
  Subjective:    Patient ID: Emma Dawson, female    DOB: 1931/12/05, 78 y.o.   MRN: [id number]  HPI The PT is here for follow up and re-evaluation of chronic medical conditions, medication management and review of any available recent lab and radiology data.  Preventive health is updated, specifically  Cancer screening and Immunization.  Continues to refuse screens . The PT denies any adverse reactions to current medications since the last visit.  C/o increased knee pain, only request is that  tylenol be prescribed     Review of Systems See HPI Denies recent fever or chills. Denies sinus pressure, nasal congestion, ear pain or sore throat. Denies chest congestion, productive cough or wheezing. Denies chest pains, palpitations and leg swelling Denies abdominal pain, nausea, vomiting,diarrhea or constipation.   Denies dysuria, frequency, hesitancy or incontinence.  Denies headaches, seizures, numbness, or tingling. Denies depression, anxiety or insomnia. Denies skin break down or rash.        Objective:   Physical Exam  Patient alert and oriented and in no cardiopulmonary distress.  HEENT: No facial asymmetry, EOMI, no sinus tenderness,  oropharynx pink and moist.  Neck supple no adenopathy.  Chest: Clear to auscultation bilaterally.  CVS: S1, S2 no murmurs, no S3.  ABD: Soft non tender. Bowel sounds normal.  Ext: No edema  MS: Adequate ROM spine, shoulders, hips and reduced in  knees.  Skin: Intact, no ulcerations or rash noted.  Psych: Good eye contact, normal affect. Memory intact not anxious or depressed appearing.  CNS: CN 2-12 intact, power, tone and sensation normal throughout.       Assessment & Plan:

## 2012-05-08 NOTE — Patient Instructions (Addendum)
F/U in 4.5 months, please call if you need me before  Fasting lipid, cmp and CBC in end October  Tylenol is sent in for right knee pain   Cut back on fried foods, and red meat.

## 2012-05-13 NOTE — Assessment & Plan Note (Signed)
Adequate but sub optimal control, no med change, lifestyle modification re low salt diet, regular activity and a diet rich in fruit and veg encouraged

## 2012-05-13 NOTE — Assessment & Plan Note (Signed)
Increased knee pain, no falls, tylenol prescribed

## 2012-05-13 NOTE — Assessment & Plan Note (Signed)
No recent flare.  

## 2012-05-13 NOTE — Assessment & Plan Note (Signed)
Sub optimal control, lifestyle modification only, no med change

## 2012-06-19 ENCOUNTER — Other Ambulatory Visit (HOSPITAL_COMMUNITY): Payer: Medicare Other

## 2012-06-22 ENCOUNTER — Encounter (HOSPITAL_COMMUNITY): Payer: Medicare Other | Attending: Oncology

## 2012-06-22 DIAGNOSIS — C50919 Malignant neoplasm of unspecified site of unspecified female breast: Secondary | ICD-10-CM | POA: Insufficient documentation

## 2012-06-22 DIAGNOSIS — Z452 Encounter for adjustment and management of vascular access device: Secondary | ICD-10-CM

## 2012-06-22 MED ORDER — SODIUM CHLORIDE 0.9 % IJ SOLN
20.0000 mL | INTRAMUSCULAR | Status: DC | PRN
  Administered 2012-06-22: 20 mL via INTRAVENOUS
  Filled 2012-06-22: qty 20

## 2012-06-22 MED ORDER — HEPARIN SOD (PORK) LOCK FLUSH 100 UNIT/ML IV SOLN
500.0000 [IU] | Freq: Once | INTRAVENOUS | Status: AC
  Administered 2012-06-22: 500 [IU] via INTRAVENOUS
  Filled 2012-06-22: qty 5

## 2012-06-22 NOTE — Progress Notes (Signed)
Uma W Gougeon presented for Portacath access and flush. Proper placement of portacath confirmed by CXR. Portacath located left chest wall accessed with  H 20 needle. Good blood return present. Portacath flushed with 20ml NS and 500U/5ml Heparin and needle removed intact. Procedure without incident. Patient tolerated procedure well.   

## 2012-08-03 ENCOUNTER — Other Ambulatory Visit (HOSPITAL_COMMUNITY): Payer: Medicare Other

## 2012-08-07 ENCOUNTER — Encounter (HOSPITAL_COMMUNITY): Payer: Medicare Other | Attending: Oncology

## 2012-08-07 VITALS — BP 129/68 | HR 84

## 2012-08-07 DIAGNOSIS — C50919 Malignant neoplasm of unspecified site of unspecified female breast: Secondary | ICD-10-CM | POA: Diagnosis not present

## 2012-08-07 DIAGNOSIS — Z452 Encounter for adjustment and management of vascular access device: Secondary | ICD-10-CM

## 2012-08-07 MED ORDER — HEPARIN SOD (PORK) LOCK FLUSH 100 UNIT/ML IV SOLN
INTRAVENOUS | Status: AC
  Filled 2012-08-07: qty 5

## 2012-08-07 MED ORDER — SODIUM CHLORIDE 0.9 % IJ SOLN
INTRAMUSCULAR | Status: AC
  Filled 2012-08-07: qty 10

## 2012-08-07 MED ORDER — HEPARIN SOD (PORK) LOCK FLUSH 100 UNIT/ML IV SOLN
500.0000 [IU] | Freq: Once | INTRAVENOUS | Status: AC
  Administered 2012-08-07: 500 [IU] via INTRAVENOUS
  Filled 2012-08-07: qty 5

## 2012-08-07 NOTE — Progress Notes (Signed)
Emma Dawson presented for Portacath access and flush. Proper placement of portacath confirmed by CXR. Portacath located lt chest wall accessed with  H 20 needle. Good blood return present. Portacath flushed with 20ml NS and 500U/5ml Heparin and needle removed intact. Procedure without incident. Patient tolerated procedure well.   

## 2012-09-17 ENCOUNTER — Encounter (HOSPITAL_COMMUNITY): Payer: Medicare Other

## 2012-09-25 ENCOUNTER — Other Ambulatory Visit: Payer: Self-pay | Admitting: Family Medicine

## 2012-09-25 ENCOUNTER — Encounter (HOSPITAL_COMMUNITY): Payer: Medicare Other | Attending: Oncology

## 2012-09-25 DIAGNOSIS — E785 Hyperlipidemia, unspecified: Secondary | ICD-10-CM | POA: Diagnosis not present

## 2012-09-25 DIAGNOSIS — Z452 Encounter for adjustment and management of vascular access device: Secondary | ICD-10-CM

## 2012-09-25 DIAGNOSIS — Z23 Encounter for immunization: Secondary | ICD-10-CM | POA: Diagnosis not present

## 2012-09-25 DIAGNOSIS — C50919 Malignant neoplasm of unspecified site of unspecified female breast: Secondary | ICD-10-CM | POA: Diagnosis not present

## 2012-09-25 DIAGNOSIS — M1990 Unspecified osteoarthritis, unspecified site: Secondary | ICD-10-CM | POA: Diagnosis not present

## 2012-09-25 MED ORDER — SODIUM CHLORIDE 0.9 % IJ SOLN
INTRAMUSCULAR | Status: AC
  Filled 2012-09-25: qty 10

## 2012-09-25 MED ORDER — HEPARIN SOD (PORK) LOCK FLUSH 100 UNIT/ML IV SOLN
INTRAVENOUS | Status: AC
  Filled 2012-09-25: qty 5

## 2012-09-25 MED ORDER — SODIUM CHLORIDE 0.9 % IJ SOLN
20.0000 mL | INTRAMUSCULAR | Status: DC | PRN
  Administered 2012-09-25: 20 mL via INTRAVENOUS
  Filled 2012-09-25: qty 20

## 2012-09-25 MED ORDER — HEPARIN SOD (PORK) LOCK FLUSH 100 UNIT/ML IV SOLN
500.0000 [IU] | Freq: Once | INTRAVENOUS | Status: AC
  Administered 2012-09-25: 500 [IU] via INTRAVENOUS
  Filled 2012-09-25: qty 5

## 2012-09-25 NOTE — Progress Notes (Signed)
Emma Dawson presented for Portacath access and flush. Proper placement of portacath confirmed by CXR. Portacath located left chest wall accessed with  H 20 needle. Good blood return present. Portacath flushed with 20ml NS and 500U/5ml Heparin and needle removed intact. Procedure without incident. Patient tolerated procedure well.   

## 2012-09-26 LAB — LIPID PANEL: Cholesterol: 199 mg/dL (ref 0–200)

## 2012-09-26 LAB — COMPREHENSIVE METABOLIC PANEL
ALT: 10 U/L (ref 0–35)
AST: 18 U/L (ref 0–37)
Albumin: 4.4 g/dL (ref 3.5–5.2)
CO2: 28 mEq/L (ref 19–32)
Calcium: 9.9 mg/dL (ref 8.4–10.5)
Chloride: 101 mEq/L (ref 96–112)
Potassium: 4.2 mEq/L (ref 3.5–5.3)
Sodium: 138 mEq/L (ref 135–145)
Total Protein: 7.8 g/dL (ref 6.0–8.3)

## 2012-09-26 LAB — CBC
MCV: 77.5 fL — ABNORMAL LOW (ref 78.0–100.0)
Platelets: 315 10*3/uL (ref 150–400)
RBC: 4.35 MIL/uL (ref 3.87–5.11)
WBC: 7.6 10*3/uL (ref 4.0–10.5)

## 2012-11-06 ENCOUNTER — Encounter (HOSPITAL_COMMUNITY): Payer: Medicare Other

## 2012-11-21 DIAGNOSIS — R7302 Impaired glucose tolerance (oral): Secondary | ICD-10-CM

## 2012-11-21 HISTORY — DX: Impaired glucose tolerance (oral): R73.02

## 2012-11-27 ENCOUNTER — Encounter (HOSPITAL_COMMUNITY): Payer: Medicare Other

## 2012-11-27 ENCOUNTER — Ambulatory Visit: Payer: Medicare Other | Admitting: Family Medicine

## 2012-11-29 ENCOUNTER — Other Ambulatory Visit: Payer: Self-pay

## 2012-11-29 DIAGNOSIS — I10 Essential (primary) hypertension: Secondary | ICD-10-CM

## 2012-11-29 MED ORDER — AMLODIPINE BESYLATE 10 MG PO TABS: 10.0000 mg | ORAL_TABLET | Freq: Every day | ORAL | Status: DC

## 2012-11-29 MED ORDER — HYDRALAZINE HCL 10 MG PO TABS: 10.0000 mg | ORAL_TABLET | Freq: Every day | ORAL | Status: DC

## 2012-11-29 MED ORDER — LOSARTAN POTASSIUM-HCTZ 100-25 MG PO TABS: 1.0000 | ORAL_TABLET | Freq: Every day | ORAL | Status: DC

## 2012-12-04 ENCOUNTER — Encounter (HOSPITAL_COMMUNITY): Payer: Medicare Other | Attending: Oncology

## 2012-12-04 ENCOUNTER — Ambulatory Visit (INDEPENDENT_AMBULATORY_CARE_PROVIDER_SITE_OTHER): Payer: Medicare Other | Admitting: Family Medicine

## 2012-12-04 ENCOUNTER — Encounter: Payer: Self-pay | Admitting: Family Medicine

## 2012-12-04 VITALS — BP 144/58 | HR 102 | Resp 16 | Ht 62.0 in | Wt 158.0 lb

## 2012-12-04 DIAGNOSIS — E785 Hyperlipidemia, unspecified: Secondary | ICD-10-CM

## 2012-12-04 DIAGNOSIS — Z95828 Presence of other vascular implants and grafts: Secondary | ICD-10-CM

## 2012-12-04 DIAGNOSIS — C50919 Malignant neoplasm of unspecified site of unspecified female breast: Secondary | ICD-10-CM

## 2012-12-04 DIAGNOSIS — I10 Essential (primary) hypertension: Secondary | ICD-10-CM | POA: Diagnosis not present

## 2012-12-04 DIAGNOSIS — Z452 Encounter for adjustment and management of vascular access device: Secondary | ICD-10-CM

## 2012-12-04 DIAGNOSIS — Z9889 Other specified postprocedural states: Secondary | ICD-10-CM | POA: Insufficient documentation

## 2012-12-04 MED ORDER — HYDRALAZINE HCL 10 MG PO TABS: ORAL_TABLET | ORAL | Status: DC

## 2012-12-04 MED ORDER — LOSARTAN POTASSIUM 100 MG PO TABS: 100.0000 mg | ORAL_TABLET | Freq: Every day | ORAL | Status: DC

## 2012-12-04 MED ORDER — HEPARIN SOD (PORK) LOCK FLUSH 100 UNIT/ML IV SOLN
INTRAVENOUS | Status: AC
  Filled 2012-12-04: qty 5

## 2012-12-04 MED ORDER — SODIUM CHLORIDE 0.9 % IJ SOLN
10.0000 mL | INTRAMUSCULAR | Status: DC | PRN
  Administered 2012-12-04: 10 mL via INTRAVENOUS
  Filled 2012-12-04: qty 10

## 2012-12-04 MED ORDER — AMLODIPINE BESYLATE 10 MG PO TABS: 10.0000 mg | ORAL_TABLET | Freq: Every day | ORAL | Status: DC

## 2012-12-04 MED ORDER — HEPARIN SOD (PORK) LOCK FLUSH 100 UNIT/ML IV SOLN
500.0000 [IU] | Freq: Once | INTRAVENOUS | Status: AC
  Administered 2012-12-04: 500 [IU] via INTRAVENOUS
  Filled 2012-12-04: qty 5

## 2012-12-04 MED ORDER — ROSUVASTATIN CALCIUM 20 MG PO TABS: 20.0000 mg | ORAL_TABLET | Freq: Every day | ORAL | Status: DC

## 2012-12-04 NOTE — Patient Instructions (Addendum)
Annual wellness in 5.5 month  Reduced medication for your blood pressure and new directions since you have fallen twice  New is losartan 100mg  , stop the old pill of losartan/hCTZ effective tomorrow.  Also please take hydralazine at bedtime   Discuss removing the port with Dr Mariel Sleet when you next visit

## 2012-12-04 NOTE — Progress Notes (Signed)
Emma Dawson presented for Portacath access and flush. Proper placement of portacath confirmed by CXR. Portacath located rt chest wall accessed with  H 20 needle. Good blood return present. Portacath flushed with 20ml NS and 500U/83ml Heparin and needle removed intact. Procedure without incident. Patient tolerated procedure well.

## 2012-12-09 NOTE — Assessment & Plan Note (Signed)
Elevated LDL , otherwise well controlled. No med change at this time.  Hyperlipidemia:Low fat diet discussed and encouraged.

## 2012-12-09 NOTE — Progress Notes (Signed)
  Subjective:    Patient ID: Emma Dawson, female    DOB: Nov 08, 1932, 79 y.o.   MRN: [id number]  HPI  The PT is here for follow up and re-evaluation of chronic medical conditions, medication management and review of any available recent lab and radiology data.  Preventive health is updated, specifically  Cancer screening and Immunization. Refuses most  Questions or concerns regarding consultations or procedures which the PT has had in the interim are  Addressed.Has upocoming appt with oncology, still has port a cath which has not been used for years C/o recurrent light headedness and falls, feels as though her blood pressure med is too strong and the cause  Of the falls Remains activity, exercises at the [facility] regularly, however wants no immunizations or screening tests   Review of Systems See HPI Denies recent fever or chills. Denies sinus pressure, nasal congestion, ear pain or sore throat. Denies chest congestion, productive cough or wheezing. Denies chest pains, palpitations and leg swelling Denies abdominal pain, nausea, vomiting,diarrhea or constipation.   Denies dysuria, frequency, hesitancy or incontinence.  Denies headaches, seizures, numbness, or tingling. Denies depression, anxiety or insomnia. Denies skin break down or rash.        Objective:   Physical Exam  Patient alert and oriented and in no cardiopulmonary distress.  HEENT: No facial asymmetry, EOMI, no sinus tenderness,  oropharynx pink and moist.  Neck supple no adenopathy.  Chest: Clear to auscultation bilaterally.  CVS: S1, S2 no murmurs, no S3.  ABD: Soft non tender. Bowel sounds normal.  Ext: No edema  MS: Adequate ROM spine, shoulders, hips and knees.  Skin: Intact, no ulcerations or rash noted.  Psych: Good eye contact, normal affect. Memory intact not anxious or depressed appearing.  CNS: CN 2-12 intact, power, tone and sensation normal throughout.       Assessment & Plan:

## 2012-12-09 NOTE — Assessment & Plan Note (Addendum)
Reduce med dose a pt c/o recurrent light headedness with falls DASH diet and commitment to daily physical activity for a minimum of 30 minutes discussed and encouraged, as a part of hypertension management. The importance of attaining a healthy weight is also discussed.

## 2013-01-16 ENCOUNTER — Encounter (HOSPITAL_COMMUNITY): Payer: Medicare Other

## 2013-01-21 ENCOUNTER — Encounter (HOSPITAL_COMMUNITY): Payer: Medicare Other | Attending: Oncology

## 2013-01-21 DIAGNOSIS — C50919 Malignant neoplasm of unspecified site of unspecified female breast: Secondary | ICD-10-CM | POA: Diagnosis not present

## 2013-01-21 DIAGNOSIS — Z452 Encounter for adjustment and management of vascular access device: Secondary | ICD-10-CM

## 2013-01-21 DIAGNOSIS — Z9889 Other specified postprocedural states: Secondary | ICD-10-CM | POA: Insufficient documentation

## 2013-01-21 MED ORDER — SODIUM CHLORIDE 0.9 % IJ SOLN
10.0000 mL | INTRAMUSCULAR | Status: DC | PRN
  Administered 2013-01-21: 10 mL via INTRAVENOUS
  Filled 2013-01-21: qty 10

## 2013-01-21 MED ORDER — HEPARIN SOD (PORK) LOCK FLUSH 100 UNIT/ML IV SOLN
INTRAVENOUS | Status: AC
  Filled 2013-01-21: qty 5

## 2013-01-21 MED ORDER — HEPARIN SOD (PORK) LOCK FLUSH 100 UNIT/ML IV SOLN
500.0000 [IU] | Freq: Once | INTRAVENOUS | Status: AC
  Administered 2013-01-21: 500 [IU] via INTRAVENOUS
  Filled 2013-01-21: qty 5

## 2013-01-21 NOTE — Progress Notes (Signed)
Ashley Murrain presented for Portacath access and flush.  Proper placement of portacath confirmed by CXR.  Portacath located left chest wall accessed with  H 20 needle.  Good blood return present. Portacath flushed with 20ml NS and 500U/14ml Heparin and needle removed intact.  Procedure tolerated well and without incident.

## 2013-03-05 ENCOUNTER — Encounter (HOSPITAL_COMMUNITY): Payer: Self-pay

## 2013-03-05 ENCOUNTER — Encounter (HOSPITAL_COMMUNITY): Payer: Medicare Other | Attending: Oncology

## 2013-03-05 DIAGNOSIS — Z452 Encounter for adjustment and management of vascular access device: Secondary | ICD-10-CM | POA: Diagnosis not present

## 2013-03-05 DIAGNOSIS — C50919 Malignant neoplasm of unspecified site of unspecified female breast: Secondary | ICD-10-CM

## 2013-03-05 DIAGNOSIS — Z95828 Presence of other vascular implants and grafts: Secondary | ICD-10-CM

## 2013-03-05 DIAGNOSIS — Z9889 Other specified postprocedural states: Secondary | ICD-10-CM | POA: Insufficient documentation

## 2013-03-05 HISTORY — DX: Presence of other vascular implants and grafts: Z95.828

## 2013-03-05 MED ORDER — HEPARIN SOD (PORK) LOCK FLUSH 100 UNIT/ML IV SOLN
500.0000 [IU] | Freq: Once | INTRAVENOUS | Status: AC
  Administered 2013-03-05: 500 [IU] via INTRAVENOUS
  Filled 2013-03-05: qty 5

## 2013-03-05 MED ORDER — SODIUM CHLORIDE 0.9 % IJ SOLN
10.0000 mL | INTRAMUSCULAR | Status: DC | PRN
  Administered 2013-03-05: 10 mL via INTRAVENOUS
  Filled 2013-03-05: qty 10

## 2013-03-05 MED ORDER — HEPARIN SOD (PORK) LOCK FLUSH 100 UNIT/ML IV SOLN
INTRAVENOUS | Status: AC
  Filled 2013-03-05: qty 5

## 2013-03-05 NOTE — Progress Notes (Signed)
Emma Dawson presented for Portacath access and flush. Proper placement of portacath confirmed by CXR. Portacath located left chest wall accessed with  H 20 needle. Good blood return present. Portacath flushed with 20ml NS and 500U/5ml Heparin and needle removed intact. Procedure without incident. Patient tolerated procedure well.   

## 2013-03-22 ENCOUNTER — Encounter (HOSPITAL_COMMUNITY): Payer: Self-pay | Admitting: Oncology

## 2013-03-22 ENCOUNTER — Encounter (HOSPITAL_COMMUNITY): Payer: Medicare Other | Attending: Oncology | Admitting: Oncology

## 2013-03-22 VITALS — BP 153/65 | HR 93 | Temp 98.7°F | Resp 18 | Wt 157.0 lb

## 2013-03-22 DIAGNOSIS — C50919 Malignant neoplasm of unspecified site of unspecified female breast: Secondary | ICD-10-CM | POA: Diagnosis not present

## 2013-03-22 DIAGNOSIS — D509 Iron deficiency anemia, unspecified: Secondary | ICD-10-CM | POA: Diagnosis not present

## 2013-03-22 DIAGNOSIS — C50911 Malignant neoplasm of unspecified site of right female breast: Secondary | ICD-10-CM

## 2013-03-22 DIAGNOSIS — Z853 Personal history of malignant neoplasm of breast: Secondary | ICD-10-CM | POA: Diagnosis not present

## 2013-03-22 LAB — COMPREHENSIVE METABOLIC PANEL
ALT: 11 U/L (ref 0–35)
Alkaline Phosphatase: 56 U/L (ref 39–117)
BUN: 12 mg/dL (ref 6–23)
CO2: 25 mEq/L (ref 19–32)
Calcium: 9.1 mg/dL (ref 8.4–10.5)
GFR calc Af Amer: 63 mL/min — ABNORMAL LOW (ref >90)
GFR calc non Af Amer: 54 mL/min — ABNORMAL LOW (ref >90)
Glucose, Bld: 97 mg/dL (ref 70–99)
Sodium: 138 mEq/L (ref 135–145)
Total Protein: 7.7 g/dL (ref 6.0–8.3)

## 2013-03-22 LAB — CBC WITH DIFFERENTIAL/PLATELET
Eosinophils Absolute: 0.4 10*3/uL (ref 0.0–0.7)
Eosinophils Relative: 4 % (ref 0–5)
Lymphocytes Relative: 27 % (ref 12–46)
Lymphs Abs: 2.3 10*3/uL (ref 0.7–4.0)
MCH: 25.7 pg — ABNORMAL LOW (ref 26.0–34.0)
Monocytes Relative: 7 % (ref 3–12)
Platelets: 393 10*3/uL (ref 150–400)

## 2013-03-22 LAB — IRON AND TIBC: Iron: 76 ug/dL (ref 42–135)

## 2013-03-22 MED ORDER — HEPARIN SOD (PORK) LOCK FLUSH 100 UNIT/ML IV SOLN
500.0000 [IU] | Freq: Once | INTRAVENOUS | Status: AC
  Administered 2013-03-22: 500 [IU] via INTRAVENOUS
  Filled 2013-03-22: qty 5

## 2013-03-22 MED ORDER — SODIUM CHLORIDE 0.9 % IJ SOLN
10.0000 mL | INTRAMUSCULAR | Status: DC | PRN
  Administered 2013-03-22: 10 mL via INTRAVENOUS
  Filled 2013-03-22: qty 10

## 2013-03-22 NOTE — Patient Instructions (Addendum)
[facility] Discharge Instructions  RECOMMENDATIONS MADE BY THE CONSULTANT AND ANY TEST RESULTS WILL BE SENT TO YOUR REFERRING PHYSICIAN.  EXAM FINDINGS BY THE PHYSICIAN TODAY AND SIGNS OR SYMPTOMS TO REPORT TO CLINIC OR PRIMARY PHYSICIAN: Exam and discussion by PA.  We will flush your port and get some blood work today.  We will let you know if your blood work is abnormal.  MEDICATIONS PRESCRIBED:  none  INSTRUCTIONS GIVEN AND DISCUSSED: Report any new lumps, bone pain, shortness of breath or other symptoms.  SPECIAL INSTRUCTIONS/FOLLOW-UP: Port flushes every 6 weeks and follow-up in 1 year.  Thank you for choosing [facility] to provide your oncology and hematology care.  To afford each patient quality time with our providers, please arrive at least 15 minutes before your scheduled appointment time.  With your help, our goal is to use those 15 minutes to complete the necessary work-up to ensure our physicians have the information they need to help with your evaluation and healthcare recommendations.    Effective January 1st, 2014, we ask that you re-schedule your appointment with our physicians should you arrive 10 or more minutes late for your appointment.  We strive to give you quality time with our providers, and arriving late affects you and other patients whose appointments are after yours.    Again, thank you for choosing [facility].  Our hope is that these requests will decrease the amount of time that you wait before being seen by our physicians.       _____________________________________________________________  Should you have questions after your visit to [facility] please contact our office at [phone] between the hours of 8:30 a.m. and 5:00 p.m.  Voicemails left after 4:30 p.m. will not be returned until the following business day.  For prescription refill requests, have your pharmacy contact our office with  your prescription refill request.

## 2013-03-22 NOTE — Progress Notes (Signed)
Emma Dawson presented for Portacath access and flush. Proper placement of portacath confirmed by CXR. Portacath located left chest wall accessed with  H 20 needle. Good blood return present.  Blood drawn for labs. Portacath flushed with 20ml NS and 500U/42ml Heparin and needle removed intact. Procedure without incident. Patient tolerated procedure well.

## 2013-03-22 NOTE — Progress Notes (Signed)
Syliva Overman, MD [address], [address] [place] [address]  Invasive ductal carcinoma of breast, right  CURRENT THERAPY: Observation  INTERVAL HISTORY: Emma Dawson 79 y.o. female returns for  regular  visit for followup of Stage IIIB advanced invasive ductal carcinoma of the right breast presenting with a T4 B. N1 M0 cancer that was 6 cm in size with 8/9 lymph nodes positive for metastatic disease. The tumor was HER-2/neu positive, ER/PR negative. She is status post modified radical mastectomy on 05/06/2003 by Dr. Elpidio Anis. She then underwent adjuvant chemotherapy consisting of Adriamycin and Cytoxan for 6 cycles followed by 4 cycles of Taxotere. She is also status post 48 weeks of Herceptin therapy. She also received radiation to the chest wall. She completed all her therapy on 12/30/2004 and a followup PET scan in June 2006 revealed no evidence of disease.  Dallis reports that she has somewhere else to go so "hurry up."  She reports the first thing she needs to do is get something to eat.   Arthurine reports that she is tending to a small garden at home.  She is growing cabbage and onions.   She continues to have a port.  She does not want it removed at this time and understands that there is not a problem with keeping the port other than the burden of having it flushed every 4-6 weeks.  She is fine with that.  I personally reviewed and went over laboratory results with the patient.  Her Hgb is stable but anemic, but her MCV is noted to be mildly low.  As a result, we will perform labs today including iron/TIBC, and ferritin.  Chart is reviewed and patient has not had a mammogram since 2006.  She declines to have screening mammogram.  Physical exam shows fibroglandular tissue therefore making breast examination difficult.  She was encouraged to have a screening mammogram and she does not want.  She was educated on her fibroglandular breast tissue.  She was educated on the role of  mammography.  It is my recommendation that she have a mammogram performed on a yearly basis.   Oncologically, she denies any complaints and ROS questioning is negative.    Past Medical History  Diagnosis Date  . Normocytic anemia   . History of chemotherapy     And radiation secondary to breast cancer  . Osteoarthritis   . Hypertension   . Hyperlipidemia   . History of breast cancer   . Allergic rhinitis   . Port catheter in place 03/05/2013    has GENITAL HERPES; TINEA VERSICOLOR; HYPERLIPIDEMIA; ANEMIA, NORMOCYTIC; HYPERTENSION; CAROTID ARTERY STENOSIS, LEFT; ALLERGIC RHINITIS; COUGH VARIANT ASTHMA; ECZEMA; OSTEOARTHRITIS; SYNCOPE, VASOVAGAL; FATIGUE; CARDIAC MURMUR; Invasive ductal carcinoma of breast; Chest pain, atypical; and Port catheter in place on her problem list.     has No Known Allergies.  Ms. Kuk does not currently have medications on file.  Past Surgical History  Procedure Laterality Date  . Abdominal hysterectomy  1970    Fibroid tumors, benign  . Mastectomy  2004    Right    Denies any headaches, dizziness, double vision, fevers, chills, night sweats, nausea, vomiting, diarrhea, constipation, chest pain, heart palpitations, shortness of breath, blood in stool, black tarry stool, urinary pain, urinary burning, urinary frequency, hematuria.   PHYSICAL EXAMINATION  ECOG PERFORMANCE STATUS: 1 - Symptomatic but completely ambulatory  Filed Vitals:   03/22/13 0851  BP: 153/65  Pulse: 93  Temp: 98.7 F (37.1 C)  Resp: 18    GENERAL:alert, healthy, well nourished, well developed, comfortable, cooperative, obese and smiling SKIN: skin color, texture, turgor are normal, no rashes or significant lesions.  LUQ abdominal SK  HEAD: Normocephalic, No masses, lesions, tenderness or abnormalities EYES: normal, Conjunctiva are pink and non-injected EARS: External ears normal OROPHARYNX:mucous membranes are moist  NECK: supple, no adenopathy, thyroid normal size,  non-tender, without nodularity, no stridor, non-tender, trachea midline LYMPH:  no palpable lymphadenopathy, no hepatosplenomegaly BREAST:left breast demonstrates fibroglandular tissue without any distinct masses or lesions, no skin or nipple changes or axillary nodes, right post-mastectomy site well healed and free of suspicious changes LUNGS: clear to auscultation and percussion HEART: regular rate & rhythm, no murmurs, no gallops, S1 normal and S2 normal ABDOMEN:abdomen soft, non-tender, obese, normal bowel sounds, no masses or organomegaly and no hepatosplenomegaly BACK: Back symmetric, no curvature., No CVA tenderness EXTREMITIES:less then 2 second capillary refill, no joint deformities, effusion, or inflammation, no edema, no skin discoloration, no clubbing, no cyanosis  NEURO: alert & oriented x 3 with fluent speech, no focal motor/sensory deficits, gait normal   LABORATORY DATA: CBC    Component Value Date/Time   WBC 7.6 09/25/2012 0810   RBC 4.35 09/25/2012 0810   HGB 11.2* 09/25/2012 0810   HCT 33.7* 09/25/2012 0810   PLT 315 09/25/2012 0810   MCV 77.5* 09/25/2012 0810   MCH 25.7* 09/25/2012 0810   MCHC 33.2 09/25/2012 0810   RDW 15.5 09/25/2012 0810   LYMPHSABS 2.6 06/23/2011 1653   MONOABS 0.6 06/23/2011 1653   EOSABS 0.5 06/23/2011 1653   BASOSABS 0.1 06/23/2011 1653      Chemistry      Component Value Date/Time   NA 138 09/25/2012 0810   K 4.2 09/25/2012 0810   CL 101 09/25/2012 0810   CO2 28 09/25/2012 0810   BUN 20 09/25/2012 0810   CREATININE 1.26* 09/25/2012 0810   CREATININE 1.18 09/07/2010 1918      Component Value Date/Time   CALCIUM 9.9 09/25/2012 0810   ALKPHOS 45 09/25/2012 0810   AST 18 09/25/2012 0810   ALT 10 09/25/2012 0810   BILITOT 0.4 09/25/2012 0810         ASSESSMENT:  1. Stage IIIB advanced invasive ductal carcinoma of the right breast presenting with a T4 B. N1 M0 cancer that was 6 cm in size with 8/9 lymph nodes positive for metastatic disease. The tumor  was HER-2/neu positive, ER/PR negative. She is status post modified radical mastectomy on 05/06/2003 by Dr. Elpidio Anis. She then underwent adjuvant chemotherapy consisting of Adriamycin and Cytoxan for 6 cycles followed by 4 cycles of Taxotere. She is also status post 48 weeks of Herceptin therapy. She also received radiation to the chest wall. She completed all her therapy on 12/30/2004 and a followup PET scan in June 2006 revealed no evidence of disease. 2. Noncompliance with preventative health maintenance, namely yearly screening mammogram with last one in 2006 and she declines a future screening mammogram. 3. LUQ abdominal SK  Patient Active Problem List   Diagnosis Date Noted  . Port catheter in place 03/05/2013  . Chest pain, atypical 03/07/2012  . TINEA VERSICOLOR 04/16/2010  . COUGH VARIANT ASTHMA 04/16/2010  . FATIGUE 01/01/2010  . GENITAL HERPES 05/02/2008  . CAROTID ARTERY STENOSIS, LEFT 05/02/2008  . SYNCOPE, VASOVAGAL 05/02/2008  . CARDIAC MURMUR 05/02/2008  . ECZEMA 04/01/2008  . ANEMIA, NORMOCYTIC 10/03/2007  . HYPERLIPIDEMIA 09/27/2006  . HYPERTENSION 09/27/2006  . ALLERGIC RHINITIS 09/27/2006  .  OSTEOARTHRITIS 09/27/2006  . Invasive ductal carcinoma of breast 09/27/2006     PLAN:  1. I personally reviewed and went over laboratory results with the patient. 2. She declines referral to surgeon for port-a-cath removal. 3. Patient educated on role of mammography. 4. Patient declines screening mammogram.  Last mammogram performed in 2006 5. Labs today with port flush: CBC diff, CMET, Iron/TIBC, Ferritin 6. Rx for mastectomy supplies/bras 7. Return in 1 year for follow-up   All questions were answered. The patient knows to call the clinic with any problems, questions or concerns. We can certainly see the patient much sooner if necessary.  The patient and plan discussed with Glenford Peers, MD and he is in agreement with the aforementioned.  KEFALAS,THOMAS

## 2013-04-16 ENCOUNTER — Encounter (HOSPITAL_COMMUNITY): Payer: Medicare Other

## 2013-04-26 ENCOUNTER — Other Ambulatory Visit (HOSPITAL_COMMUNITY): Payer: Medicare Other

## 2013-05-02 ENCOUNTER — Encounter (HOSPITAL_COMMUNITY): Payer: Medicare Other

## 2013-05-09 ENCOUNTER — Encounter (HOSPITAL_COMMUNITY): Payer: Medicare Other | Attending: Oncology

## 2013-05-09 DIAGNOSIS — Z9889 Other specified postprocedural states: Secondary | ICD-10-CM | POA: Insufficient documentation

## 2013-05-09 DIAGNOSIS — Z853 Personal history of malignant neoplasm of breast: Secondary | ICD-10-CM

## 2013-05-09 DIAGNOSIS — Z452 Encounter for adjustment and management of vascular access device: Secondary | ICD-10-CM | POA: Diagnosis not present

## 2013-05-09 DIAGNOSIS — Z95828 Presence of other vascular implants and grafts: Secondary | ICD-10-CM

## 2013-05-09 MED ORDER — HEPARIN SOD (PORK) LOCK FLUSH 100 UNIT/ML IV SOLN
500.0000 [IU] | Freq: Once | INTRAVENOUS | Status: AC
  Administered 2013-05-09: 500 [IU] via INTRAVENOUS
  Filled 2013-05-09: qty 5

## 2013-05-09 MED ORDER — SODIUM CHLORIDE 0.9 % IJ SOLN
10.0000 mL | INTRAMUSCULAR | Status: DC | PRN
  Administered 2013-05-09: 10 mL via INTRAVENOUS
  Filled 2013-05-09: qty 10

## 2013-05-09 MED ORDER — HEPARIN SOD (PORK) LOCK FLUSH 100 UNIT/ML IV SOLN
INTRAVENOUS | Status: AC
  Filled 2013-05-09: qty 5

## 2013-05-09 NOTE — Progress Notes (Signed)
Emma Dawson presented for Portacath access and flush. Proper placement of portacath confirmed by CXR. Portacath located lt chest wall accessed with  H 20 needle. Good blood return present. Portacath flushed with 20ml NS and 500U/5ml Heparin and needle removed intact. Procedure without incident. Patient tolerated procedure well.   

## 2013-05-23 ENCOUNTER — Other Ambulatory Visit (HOSPITAL_COMMUNITY): Payer: Medicare Other

## 2013-06-11 ENCOUNTER — Other Ambulatory Visit: Payer: Self-pay | Admitting: Family Medicine

## 2013-06-13 ENCOUNTER — Encounter (HOSPITAL_COMMUNITY): Payer: Medicare Other | Attending: Oncology

## 2013-06-13 DIAGNOSIS — Z853 Personal history of malignant neoplasm of breast: Secondary | ICD-10-CM

## 2013-06-13 DIAGNOSIS — Z9889 Other specified postprocedural states: Secondary | ICD-10-CM | POA: Insufficient documentation

## 2013-06-13 DIAGNOSIS — Z452 Encounter for adjustment and management of vascular access device: Secondary | ICD-10-CM

## 2013-06-13 MED ORDER — HEPARIN SOD (PORK) LOCK FLUSH 100 UNIT/ML IV SOLN
500.0000 [IU] | Freq: Once | INTRAVENOUS | Status: AC
  Administered 2013-06-13: 500 [IU] via INTRAVENOUS
  Filled 2013-06-13: qty 5

## 2013-06-13 MED ORDER — HEPARIN SOD (PORK) LOCK FLUSH 100 UNIT/ML IV SOLN
INTRAVENOUS | Status: AC
  Filled 2013-06-13: qty 5

## 2013-06-13 MED ORDER — SODIUM CHLORIDE 0.9 % IJ SOLN
10.0000 mL | INTRAMUSCULAR | Status: DC | PRN
  Administered 2013-06-13: 10 mL via INTRAVENOUS
  Filled 2013-06-13: qty 10

## 2013-06-13 NOTE — Progress Notes (Signed)
Emma Dawson presented for Portacath access and flush. Proper placement of portacath confirmed by CXR. Portacath located lt chest wall accessed with  H 20 needle. Good blood return present. Portacath flushed with 20ml NS and 500U/5ml Heparin and needle removed intact. Procedure without incident. Patient tolerated procedure well.   

## 2013-06-26 ENCOUNTER — Other Ambulatory Visit (HOSPITAL_COMMUNITY): Payer: Medicare Other

## 2013-07-08 ENCOUNTER — Other Ambulatory Visit: Payer: Self-pay | Admitting: Family Medicine

## 2013-07-11 ENCOUNTER — Other Ambulatory Visit: Payer: Self-pay | Admitting: Family Medicine

## 2013-07-25 ENCOUNTER — Encounter (HOSPITAL_COMMUNITY): Payer: Medicare Other | Attending: Oncology

## 2013-07-25 DIAGNOSIS — Z853 Personal history of malignant neoplasm of breast: Secondary | ICD-10-CM | POA: Diagnosis not present

## 2013-07-25 DIAGNOSIS — Z95828 Presence of other vascular implants and grafts: Secondary | ICD-10-CM

## 2013-07-25 DIAGNOSIS — Z452 Encounter for adjustment and management of vascular access device: Secondary | ICD-10-CM | POA: Diagnosis not present

## 2013-07-25 DIAGNOSIS — Z9889 Other specified postprocedural states: Secondary | ICD-10-CM | POA: Insufficient documentation

## 2013-07-25 MED ORDER — HEPARIN SOD (PORK) LOCK FLUSH 100 UNIT/ML IV SOLN
500.0000 [IU] | Freq: Once | INTRAVENOUS | Status: AC
  Administered 2013-07-25: 500 [IU] via INTRAVENOUS
  Filled 2013-07-25: qty 5

## 2013-07-25 MED ORDER — HEPARIN SOD (PORK) LOCK FLUSH 100 UNIT/ML IV SOLN
INTRAVENOUS | Status: AC
  Filled 2013-07-25: qty 5

## 2013-07-25 MED ORDER — SODIUM CHLORIDE 0.9 % IJ SOLN
10.0000 mL | INTRAMUSCULAR | Status: DC | PRN
  Administered 2013-07-25: 10 mL via INTRAVENOUS
  Filled 2013-07-25: qty 10

## 2013-07-25 NOTE — Progress Notes (Signed)
Emma Dawson presented for Portacath access and flush. Proper placement of portacath confirmed by CXR. Portacath located left chest wall accessed with  H 20 needle. Good blood return present. Portacath flushed with 20ml NS and 500U/5ml Heparin and needle removed intact. Procedure without incident. Patient tolerated procedure well.   

## 2013-07-26 ENCOUNTER — Other Ambulatory Visit (HOSPITAL_COMMUNITY): Payer: Medicare Other

## 2013-08-05 ENCOUNTER — Other Ambulatory Visit: Payer: Self-pay | Admitting: Family Medicine

## 2013-08-23 DIAGNOSIS — Z23 Encounter for immunization: Secondary | ICD-10-CM | POA: Diagnosis not present

## 2013-08-26 ENCOUNTER — Other Ambulatory Visit (HOSPITAL_COMMUNITY): Payer: Medicare Other

## 2013-09-05 ENCOUNTER — Ambulatory Visit: Payer: Medicare Other | Admitting: Family Medicine

## 2013-09-05 ENCOUNTER — Other Ambulatory Visit: Payer: Self-pay | Admitting: Family Medicine

## 2013-09-05 ENCOUNTER — Encounter (HOSPITAL_COMMUNITY): Payer: Medicare Other

## 2013-09-26 ENCOUNTER — Encounter: Payer: Self-pay | Admitting: Family Medicine

## 2013-09-26 ENCOUNTER — Other Ambulatory Visit: Payer: Self-pay | Admitting: Family Medicine

## 2013-09-26 ENCOUNTER — Encounter (HOSPITAL_COMMUNITY): Payer: Medicare Other | Attending: Oncology

## 2013-09-26 ENCOUNTER — Other Ambulatory Visit (HOSPITAL_COMMUNITY): Payer: Medicare Other

## 2013-09-26 ENCOUNTER — Ambulatory Visit (INDEPENDENT_AMBULATORY_CARE_PROVIDER_SITE_OTHER): Payer: Medicare Other | Admitting: Family Medicine

## 2013-09-26 VITALS — BP 158/70 | HR 72 | Resp 16 | Wt 158.1 lb

## 2013-09-26 DIAGNOSIS — J45991 Cough variant asthma: Secondary | ICD-10-CM

## 2013-09-26 DIAGNOSIS — I6529 Occlusion and stenosis of unspecified carotid artery: Secondary | ICD-10-CM | POA: Diagnosis not present

## 2013-09-26 DIAGNOSIS — M899 Disorder of bone, unspecified: Secondary | ICD-10-CM | POA: Diagnosis not present

## 2013-09-26 DIAGNOSIS — R7301 Impaired fasting glucose: Secondary | ICD-10-CM | POA: Diagnosis not present

## 2013-09-26 DIAGNOSIS — R5381 Other malaise: Secondary | ICD-10-CM | POA: Diagnosis not present

## 2013-09-26 DIAGNOSIS — Z452 Encounter for adjustment and management of vascular access device: Secondary | ICD-10-CM

## 2013-09-26 DIAGNOSIS — Z95828 Presence of other vascular implants and grafts: Secondary | ICD-10-CM

## 2013-09-26 DIAGNOSIS — D539 Nutritional anemia, unspecified: Secondary | ICD-10-CM | POA: Diagnosis not present

## 2013-09-26 DIAGNOSIS — I10 Essential (primary) hypertension: Secondary | ICD-10-CM

## 2013-09-26 DIAGNOSIS — Z853 Personal history of malignant neoplasm of breast: Secondary | ICD-10-CM

## 2013-09-26 DIAGNOSIS — Z139 Encounter for screening, unspecified: Secondary | ICD-10-CM

## 2013-09-26 DIAGNOSIS — Z9889 Other specified postprocedural states: Secondary | ICD-10-CM

## 2013-09-26 DIAGNOSIS — E785 Hyperlipidemia, unspecified: Secondary | ICD-10-CM | POA: Diagnosis not present

## 2013-09-26 DIAGNOSIS — Z9119 Patient's noncompliance with other medical treatment and regimen: Secondary | ICD-10-CM

## 2013-09-26 LAB — CBC WITH DIFFERENTIAL/PLATELET
Eosinophils Absolute: 0.4 10*3/uL (ref 0.0–0.7)
Eosinophils Relative: 4 % (ref 0–5)
HCT: 34.9 % — ABNORMAL LOW (ref 36.0–46.0)
Hemoglobin: 11.3 g/dL — ABNORMAL LOW (ref 12.0–15.0)
Lymphs Abs: 2.4 10*3/uL (ref 0.7–4.0)
MCH: 25.3 pg — ABNORMAL LOW (ref 26.0–34.0)
MCHC: 32.4 g/dL (ref 30.0–36.0)
MCV: 78.1 fL (ref 78.0–100.0)
Monocytes Absolute: 0.6 10*3/uL (ref 0.1–1.0)
Monocytes Relative: 7 % (ref 3–12)
RBC: 4.47 MIL/uL (ref 3.87–5.11)

## 2013-09-26 LAB — COMPREHENSIVE METABOLIC PANEL
ALT: 13 U/L (ref 0–35)
AST: 18 U/L (ref 0–37)
Alkaline Phosphatase: 57 U/L (ref 39–117)
BUN: 12 mg/dL (ref 6–23)
Creat: 1.1 mg/dL (ref 0.50–1.10)

## 2013-09-26 LAB — LIPID PANEL
Cholesterol: 208 mg/dL — ABNORMAL HIGH (ref 0–200)
LDL Cholesterol: 118 mg/dL — ABNORMAL HIGH (ref 0–99)
Total CHOL/HDL Ratio: 3.6 Ratio
Triglycerides: 162 mg/dL — ABNORMAL HIGH (ref <150)
VLDL: 32 mg/dL (ref 0–40)

## 2013-09-26 LAB — FERRITIN: Ferritin: 101 ng/mL (ref 10–291)

## 2013-09-26 MED ORDER — ROSUVASTATIN CALCIUM 20 MG PO TABS: 20.0000 mg | ORAL_TABLET | Freq: Every day | ORAL | Status: DC

## 2013-09-26 MED ORDER — HEPARIN SOD (PORK) LOCK FLUSH 100 UNIT/ML IV SOLN
INTRAVENOUS | Status: AC
  Filled 2013-09-26: qty 5

## 2013-09-26 MED ORDER — AMLODIPINE BESYLATE 10 MG PO TABS: ORAL_TABLET | ORAL | Status: DC

## 2013-09-26 MED ORDER — SODIUM CHLORIDE 0.9 % IJ SOLN
10.0000 mL | INTRAMUSCULAR | Status: DC | PRN
  Administered 2013-09-26: 10 mL via INTRAVENOUS

## 2013-09-26 MED ORDER — HEPARIN SOD (PORK) LOCK FLUSH 100 UNIT/ML IV SOLN
500.0000 [IU] | Freq: Once | INTRAVENOUS | Status: AC
  Administered 2013-09-26: 500 [IU] via INTRAVENOUS

## 2013-09-26 MED ORDER — LOSARTAN POTASSIUM 100 MG PO TABS: ORAL_TABLET | ORAL | Status: DC

## 2013-09-26 MED ORDER — HYDRALAZINE HCL 10 MG PO TABS: ORAL_TABLET | ORAL | Status: DC

## 2013-09-26 NOTE — Progress Notes (Signed)
  Subjective:    Patient ID: VALEREE LEIDY, female    DOB: 25-May-1932, 79 y.o.   MRN: [id number]  HPI The PT is here for follow up and re-evaluation of chronic medical conditions, medication management and review of any available recent lab and radiology data.  Preventive health is updated, specifically  Cancer screening and Immunization.  Still refuses most recommendations Questions or concerns regarding consultations or procedures which the PT has had in the interim are  addressed. The PT denies any adverse reactions to current medications since the last visit.  Had a "near fall" in her nephew's basement recently, states she became light headed due to fumes from cooking Continues to be active and attempt healthy eating      Review of Systems See HPI Denies recent fever or chills. Denies sinus pressure, nasal congestion, ear pain or sore throat. Denies chest congestion, productive cough or wheezing. Denies chest pains, palpitations and leg swelling Denies abdominal pain, nausea, vomiting,diarrhea or constipation.   Denies dysuria, frequency, hesitancy or incontinence. Chronic  joint pain,  And minor  limitation in mobility. Denies headaches, seizures, numbness, or tingling. Denies depression, anxiety or insomnia. Denies skin break down or rash.        Objective:   Physical Exam  Patient alert and oriented and in no cardiopulmonary distress.  HEENT: No facial asymmetry, EOMI, no sinus tenderness,  oropharynx pink and moist.  Neck supple no adenopathy.Bilateral bruit  Chest: Clear to auscultation bilaterally.  CVS: S1, S2 no murmurs, no S3.  ABD: Soft non tender. Bowel sounds normal.  Ext: No edema  MS: Adequate though reduced  ROM spine, shoulders, hips and knees.  Skin: Intact, no ulcerations or rash noted.  Psych: Good eye contact, normal affect. Memory intact not anxious or depressed appearing.  CNS: CN 2-12 intact, power, tone and sensation normal  throughout.       Assessment & Plan:

## 2013-09-26 NOTE — Patient Instructions (Addendum)
Annual wellness in 6 month, please call if you need me before  Fasting lipid, cmp, vit D today, cbc , iron and ferritin today  You need a repeat study of blood flow in neck arteries, espescialy since you recently fell in the basement when light headed. Please get appointment info before you leave today  You need to resume hydralazine and this has been sent in

## 2013-09-26 NOTE — Progress Notes (Signed)
[place] W Spiller presented for Portacath access and flush. Proper placement of portacath confirmed by CXR. Portacath located lt chest wall accessed with  H 20 needle. Good blood return present. Portacath flushed with 20ml NS and 500U/5ml Heparin and needle removed intact. Procedure without incident. Patient tolerated procedure well.   

## 2013-09-27 DIAGNOSIS — Z9119 Patient's noncompliance with other medical treatment and regimen: Secondary | ICD-10-CM | POA: Insufficient documentation

## 2013-09-27 DIAGNOSIS — Z91199 Patient's noncompliance with other medical treatment and regimen due to unspecified reason: Secondary | ICD-10-CM | POA: Insufficient documentation

## 2013-09-27 LAB — HEMOGLOBIN A1C
Hgb A1c MFr Bld: 5.7 % — ABNORMAL HIGH (ref <5.7)
Mean Plasma Glucose: 117 mg/dL — ABNORMAL HIGH (ref <117)

## 2013-09-27 NOTE — Assessment & Plan Note (Signed)
Continues to refuse colonoscopy and pelvic and pap exams. Of interest, her only cancer , to date is breast, and she continues to have mammograms regularly. Consistently refuses recommended immunizations, she is re educated again today, no change in decision

## 2013-09-27 NOTE — Assessment & Plan Note (Signed)
No recent flares 

## 2013-09-27 NOTE — Assessment & Plan Note (Signed)
Uncontrolled, pt has not had hydralazine for several weeks, needs to resume same DASH diet and commitment to daily physical activity for a minimum of 30 minutes discussed and encouraged, as a part of hypertension management. The importance of attaining a healthy weight is also discussed.

## 2013-09-27 NOTE — Assessment & Plan Note (Signed)
updated imaging study needed, had recent episode of light headedness

## 2013-09-27 NOTE — Assessment & Plan Note (Signed)
Still elects not to have this removed, I did encourage to her to do so

## 2013-09-27 NOTE — Assessment & Plan Note (Signed)
Deteriorated, needs to lower fried and fatty food intake  No med change at this time

## 2013-10-01 ENCOUNTER — Ambulatory Visit (HOSPITAL_COMMUNITY): Payer: Medicare Other | Attending: Family Medicine

## 2013-10-08 ENCOUNTER — Ambulatory Visit (HOSPITAL_COMMUNITY)
Admission: RE | Admit: 2013-10-08 | Discharge: 2013-10-08 | Disposition: A | Discharge status: Home / Self Care | Payer: Medicare Other | Source: Ambulatory Visit | Attending: Family Medicine | Admitting: Family Medicine

## 2013-10-08 DIAGNOSIS — I6529 Occlusion and stenosis of unspecified carotid artery: Secondary | ICD-10-CM | POA: Insufficient documentation

## 2013-10-08 DIAGNOSIS — I658 Occlusion and stenosis of other precerebral arteries: Secondary | ICD-10-CM | POA: Diagnosis not present

## 2013-10-16 ENCOUNTER — Encounter (HOSPITAL_COMMUNITY): Payer: Medicare Other

## 2013-10-25 ENCOUNTER — Other Ambulatory Visit (HOSPITAL_COMMUNITY): Payer: Medicare Other

## 2013-11-07 ENCOUNTER — Encounter (HOSPITAL_COMMUNITY): Payer: Medicare Other | Attending: Oncology

## 2013-11-07 VITALS — BP 139/59

## 2013-11-07 DIAGNOSIS — Z9889 Other specified postprocedural states: Secondary | ICD-10-CM | POA: Insufficient documentation

## 2013-11-07 DIAGNOSIS — Z95828 Presence of other vascular implants and grafts: Secondary | ICD-10-CM

## 2013-11-07 DIAGNOSIS — Z452 Encounter for adjustment and management of vascular access device: Secondary | ICD-10-CM

## 2013-11-07 DIAGNOSIS — Z853 Personal history of malignant neoplasm of breast: Secondary | ICD-10-CM | POA: Diagnosis not present

## 2013-11-07 MED ORDER — SODIUM CHLORIDE 0.9 % IJ SOLN
10.0000 mL | INTRAMUSCULAR | Status: DC | PRN
  Administered 2013-11-07: 10 mL via INTRAVENOUS

## 2013-11-07 MED ORDER — HEPARIN SOD (PORK) LOCK FLUSH 100 UNIT/ML IV SOLN
500.0000 [IU] | Freq: Once | INTRAVENOUS | Status: AC
  Administered 2013-11-07: 500 [IU] via INTRAVENOUS

## 2013-11-07 MED ORDER — HEPARIN SOD (PORK) LOCK FLUSH 100 UNIT/ML IV SOLN
INTRAVENOUS | Status: AC
  Filled 2013-11-07: qty 5

## 2013-11-07 NOTE — Progress Notes (Signed)
Emma Dawson presented for Portacath access and flush. Proper placement of portacath confirmed by CXR. Portacath located left chest wall accessed with  H 20 needle. Good blood return present. Portacath flushed with 20ml NS and 500U/5ml Heparin and needle removed intact. Procedure without incident. Patient tolerated procedure well.   

## 2013-11-28 ENCOUNTER — Encounter (HOSPITAL_COMMUNITY): Payer: Medicare Other

## 2013-12-19 ENCOUNTER — Encounter (HOSPITAL_COMMUNITY): Payer: Medicare Other | Attending: Oncology

## 2013-12-19 DIAGNOSIS — Z452 Encounter for adjustment and management of vascular access device: Secondary | ICD-10-CM

## 2013-12-19 DIAGNOSIS — Z95828 Presence of other vascular implants and grafts: Secondary | ICD-10-CM

## 2013-12-19 DIAGNOSIS — Z853 Personal history of malignant neoplasm of breast: Secondary | ICD-10-CM | POA: Diagnosis not present

## 2013-12-19 DIAGNOSIS — Z9889 Other specified postprocedural states: Secondary | ICD-10-CM | POA: Insufficient documentation

## 2013-12-19 MED ORDER — HEPARIN SOD (PORK) LOCK FLUSH 100 UNIT/ML IV SOLN
INTRAVENOUS | Status: AC
  Filled 2013-12-19: qty 5

## 2013-12-19 MED ORDER — HEPARIN SOD (PORK) LOCK FLUSH 100 UNIT/ML IV SOLN
500.0000 [IU] | Freq: Once | INTRAVENOUS | Status: AC
  Administered 2013-12-19: 500 [IU] via INTRAVENOUS

## 2013-12-19 MED ORDER — SODIUM CHLORIDE 0.9 % IJ SOLN
10.0000 mL | INTRAMUSCULAR | Status: DC | PRN
  Administered 2013-12-19: 10 mL via INTRAVENOUS

## 2013-12-19 NOTE — Progress Notes (Signed)
Emma Dawson presented for Portacath access and flush. Proper placement of portacath confirmed by CXR. Portacath located left chest wall accessed with  H 20 needle. Good blood return present. Portacath flushed with 20ml NS and 500U/5ml Heparin and needle removed intact. Procedure without incident. Patient tolerated procedure well.   

## 2014-01-06 ENCOUNTER — Other Ambulatory Visit: Payer: Self-pay

## 2014-01-06 MED ORDER — ATORVASTATIN CALCIUM 40 MG PO TABS: 40.0000 mg | ORAL_TABLET | Freq: Every day | ORAL | Status: DC

## 2014-01-21 ENCOUNTER — Encounter (HOSPITAL_COMMUNITY): Payer: Medicare Other | Attending: Oncology

## 2014-01-21 DIAGNOSIS — Z853 Personal history of malignant neoplasm of breast: Secondary | ICD-10-CM

## 2014-01-21 DIAGNOSIS — Z452 Encounter for adjustment and management of vascular access device: Secondary | ICD-10-CM

## 2014-01-21 DIAGNOSIS — Z9889 Other specified postprocedural states: Secondary | ICD-10-CM | POA: Insufficient documentation

## 2014-01-21 DIAGNOSIS — Z95828 Presence of other vascular implants and grafts: Secondary | ICD-10-CM

## 2014-01-21 MED ORDER — SODIUM CHLORIDE 0.9 % IJ SOLN
10.0000 mL | INTRAMUSCULAR | Status: DC | PRN
  Administered 2014-01-21: 10 mL via INTRAVENOUS

## 2014-01-21 MED ORDER — HEPARIN SOD (PORK) LOCK FLUSH 100 UNIT/ML IV SOLN
500.0000 [IU] | Freq: Once | INTRAVENOUS | Status: AC
  Administered 2014-01-21: 500 [IU] via INTRAVENOUS
  Filled 2014-01-21: qty 5

## 2014-01-22 ENCOUNTER — Encounter (HOSPITAL_COMMUNITY): Payer: Medicare Other

## 2014-03-04 ENCOUNTER — Encounter (HOSPITAL_COMMUNITY): Payer: Medicare Other | Attending: Oncology

## 2014-03-04 DIAGNOSIS — Z452 Encounter for adjustment and management of vascular access device: Secondary | ICD-10-CM

## 2014-03-04 DIAGNOSIS — Z95828 Presence of other vascular implants and grafts: Secondary | ICD-10-CM

## 2014-03-04 DIAGNOSIS — Z9889 Other specified postprocedural states: Secondary | ICD-10-CM | POA: Insufficient documentation

## 2014-03-04 DIAGNOSIS — Z853 Personal history of malignant neoplasm of breast: Secondary | ICD-10-CM

## 2014-03-04 MED ORDER — HEPARIN SOD (PORK) LOCK FLUSH 100 UNIT/ML IV SOLN
INTRAVENOUS | Status: AC
  Filled 2014-03-04: qty 5

## 2014-03-04 MED ORDER — SODIUM CHLORIDE 0.9 % IJ SOLN
10.0000 mL | INTRAMUSCULAR | Status: DC | PRN
  Administered 2014-03-04: 10 mL via INTRAVENOUS

## 2014-03-04 MED ORDER — HEPARIN SOD (PORK) LOCK FLUSH 100 UNIT/ML IV SOLN
500.0000 [IU] | Freq: Once | INTRAVENOUS | Status: AC
  Administered 2014-03-04: 500 [IU] via INTRAVENOUS

## 2014-03-04 NOTE — Progress Notes (Signed)
Emma Dawson presented for Portacath access and flush. Proper placement of portacath confirmed by CXR. Portacath located left chest wall accessed with  H 20 needle. Good blood return present. Portacath flushed with 20ml NS and 500U/5ml Heparin and needle removed intact. Procedure without incident. Patient tolerated procedure well.   

## 2014-03-21 ENCOUNTER — Ambulatory Visit (HOSPITAL_COMMUNITY): Payer: Medicare Other

## 2014-03-24 ENCOUNTER — Other Ambulatory Visit: Payer: Self-pay | Admitting: Family Medicine

## 2014-03-24 NOTE — Progress Notes (Signed)
This encounter was created in error - please disregard.

## 2014-03-27 ENCOUNTER — Encounter: Payer: Self-pay | Admitting: Family Medicine

## 2014-03-27 ENCOUNTER — Ambulatory Visit (INDEPENDENT_AMBULATORY_CARE_PROVIDER_SITE_OTHER): Payer: Medicare Other | Admitting: Family Medicine

## 2014-03-27 VITALS — BP 130/60 | HR 84 | Resp 16 | Wt 158.4 lb

## 2014-03-27 DIAGNOSIS — I10 Essential (primary) hypertension: Secondary | ICD-10-CM | POA: Diagnosis not present

## 2014-03-27 DIAGNOSIS — R7301 Impaired fasting glucose: Secondary | ICD-10-CM | POA: Diagnosis not present

## 2014-03-27 DIAGNOSIS — E785 Hyperlipidemia, unspecified: Secondary | ICD-10-CM

## 2014-03-27 DIAGNOSIS — Z0000 Encounter for general adult medical examination without abnormal findings: Secondary | ICD-10-CM

## 2014-03-27 DIAGNOSIS — M1990 Unspecified osteoarthritis, unspecified site: Secondary | ICD-10-CM | POA: Diagnosis not present

## 2014-03-27 LAB — COMPREHENSIVE METABOLIC PANEL
ALBUMIN: 4.4 g/dL (ref 3.5–5.2)
ALK PHOS: 52 U/L (ref 39–117)
ALT: 12 U/L (ref 0–35)
AST: 18 U/L (ref 0–37)
BUN: 14 mg/dL (ref 6–23)
CO2: 26 mEq/L (ref 19–32)
Calcium: 9.6 mg/dL (ref 8.4–10.5)
Chloride: 103 mEq/L (ref 96–112)
Creat: 1.11 mg/dL — ABNORMAL HIGH (ref 0.50–1.10)
Glucose, Bld: 102 mg/dL — ABNORMAL HIGH (ref 70–99)
POTASSIUM: 4.8 meq/L (ref 3.5–5.3)
Sodium: 138 mEq/L (ref 135–145)
TOTAL PROTEIN: 7.3 g/dL (ref 6.0–8.3)
Total Bilirubin: 0.6 mg/dL (ref 0.2–1.2)

## 2014-03-27 LAB — CBC WITH DIFFERENTIAL/PLATELET
BASOS PCT: 1 % (ref 0–1)
Basophils Absolute: 0.1 10*3/uL (ref 0.0–0.1)
EOS PCT: 5 % (ref 0–5)
Eosinophils Absolute: 0.4 10*3/uL (ref 0.0–0.7)
HEMATOCRIT: 33 % — AB (ref 36.0–46.0)
Hemoglobin: 11.1 g/dL — ABNORMAL LOW (ref 12.0–15.0)
Lymphocytes Relative: 28 % (ref 12–46)
Lymphs Abs: 2.4 10*3/uL (ref 0.7–4.0)
MCH: 25.2 pg — AB (ref 26.0–34.0)
MCHC: 33.6 g/dL (ref 30.0–36.0)
MCV: 74.8 fL — AB (ref 78.0–100.0)
MONO ABS: 0.5 10*3/uL (ref 0.1–1.0)
Monocytes Relative: 6 % (ref 3–12)
Neutro Abs: 5.1 10*3/uL (ref 1.7–7.7)
Neutrophils Relative %: 60 % (ref 43–77)
Platelets: 320 10*3/uL (ref 150–400)
RBC: 4.41 MIL/uL (ref 3.87–5.11)
RDW: 15 % (ref 11.5–15.5)
WBC: 8.5 10*3/uL (ref 4.0–10.5)

## 2014-03-27 LAB — LIPID PANEL
CHOL/HDL RATIO: 3.6 ratio
Cholesterol: 175 mg/dL (ref 0–200)
HDL: 48 mg/dL (ref >39)
LDL CALC: 102 mg/dL — AB (ref 0–99)
Triglycerides: 127 mg/dL (ref <150)
VLDL: 25 mg/dL (ref 0–40)

## 2014-03-27 LAB — TSH: TSH: 0.915 u[IU]/mL (ref 0.350–4.500)

## 2014-03-27 LAB — HEMOGLOBIN A1C
Hgb A1c MFr Bld: 5.9 % — ABNORMAL HIGH (ref <5.7)
Mean Plasma Glucose: 123 mg/dL — ABNORMAL HIGH (ref <117)

## 2014-03-27 MED ORDER — AMLODIPINE BESYLATE 10 MG PO TABS: ORAL_TABLET | ORAL | Status: DC

## 2014-03-27 MED ORDER — HYDRALAZINE HCL 10 MG PO TABS: ORAL_TABLET | ORAL | Status: DC

## 2014-03-27 MED ORDER — LOSARTAN POTASSIUM 100 MG PO TABS: ORAL_TABLET | ORAL | Status: DC

## 2014-03-27 NOTE — Progress Notes (Signed)
Subjective:    Patient ID: Emma Dawson, female    DOB: 1932-09-01, 80 y.o.   MRN: [id number]  HPI Preventive Screening-Counseling & Management   Patient present here today for a Medicare annual wellness visit. C/o 1 month h/o increased PND with at times clear drainage consistent with allergies, no fever or chills  Current Problems (verified)   Medications Prior to Visit Allergies (verified)   PAST HISTORY  Family History as documented  Social History Widow since 2000, was married for 30 years , did not have any children, never nicotine or drug use  Risk Factors  Current exercise habits:  Walks 1 to 2 miles daily Dietary issues discussed:low carb heart healthy   Cardiac risk factors: none significant  Depression Screen  (Note: if answer to either of the following is "Yes", a more complete depression screening is indicated)   Over the past two weeks, have you felt down, depressed or hopeless? No  Over the past two weeks, have you felt little interest or pleasure in doing things? No  Have you lost interest or pleasure in daily life? No  Do you often feel hopeless? No  Do you cry easily over simple problems? No   Activities of Daily Living  In your present state of health, do you have any difficulty performing the following activities?  Driving?: yes, reduced vision in left has cataract Managing money?: No Feeding yourself?:No Getting from bed to chair?:No Climbing a flight of stairs?:No Preparing food and eating?:No Bathing or showering?:No Getting dressed?:No Getting to the toilet?:No Using the toilet?:No Moving around from place to place?: No  Fall Risk Assessment In the past year have you fallen or had a near fall?:No Are you currently taking any medications that make you dizzy?:No   Hearing Difficulties: No Do you often ask people to speak up or repeat themselves?:No Do you experience ringing or noises in your ears?:No Do you have difficulty understanding  soft or whispered voices?:No  Cognitive Testing  Alert? Yes Normal Appearance?Yes  Oriented to person? Yes Place? Yes  Time? Yes  Displays appropriate judgment?Yes  Can read the correct time from a watch face? yes Are you having problems remembering things?No  Advanced Directives have been discussed with the patient?Yes  No code, needs to further discuss with family members, she is a widow and has no children   List the Names of Other Physician/Practitioners you currently use:    Indicate any recent Medical Services you may have received from other than [facility] providers in the past year (date may be approximate).   Assessment:    Annual Wellness Exam   Plan:    During the course of the visit the patient was educated and counseled about appropriate screening and preventive services including:  A healthy diet is rich in fruit, vegetables and whole grains. Poultry fish, nuts and beans are a healthy choice for protein rather then red meat. A low sodium diet and drinking 64 ounces of water daily is generally recommended. Oils and sweet should be limited. Carbohydrates especially for those who are diabetic or overweight, should be limited to 30-45 gram per meal. It is important to eat on a regular schedule, at least 3 times daily. Snacks should be primarily fruits, vegetables or nuts. It is important that you exercise regularly at least 30 minutes 5 times a week. If you develop chest pain, have severe difficulty breathing, or feel very tired, stop exercising immediately and seek medical attention  Immunization reviewed and updated.  Cancer screening reviewed and updated    Patient Instructions (the written plan) was given to the patient.  Medicare Attestation  I have personally reviewed:  The patient's medical and social history  Their use of alcohol, tobacco or illicit drugs  Their current medications and supplements  The patient's functional ability including ADLs,fall risks, home  safety risks, cognitive, and hearing and visual impairment  Diet and physical activities  Evidence for depression or mood disorders  The patient's weight, height, BMI, and visual acuity have been recorded in the chart. I have made referrals, counseling, and provided education to the patient based on review of the above and I have provided the patient with a written personalized care plan for preventive services.      Review of Systems     Objective:   Physical Exam Elderly female in  No c/p distress. No sinus tenderness, oropharynx pink and moist no adenopathy, TM clear Chest CTA       Assessment & Plan:  Routine general medical examination at a health care facility Annual wellness as documented. Functional , no significant memory impairment, capable of ADL's.Good habits as far as diet and physical activity are concerned Consistently refuses cancer screening tests as well as dexa and f/u in carotid stenosis. Encouraged to seriously consider her stated DNR wish and if serious to have a legal document prepared, she is provided with info to f/u onthis

## 2014-03-27 NOTE — Patient Instructions (Addendum)
F/u early October,with rectsl call if you need me before  CBC, fasting lipid, cmp , hBa1C and TSh today   Reconsider screening tests

## 2014-03-30 DIAGNOSIS — Z0000 Encounter for general adult medical examination without abnormal findings: Secondary | ICD-10-CM | POA: Insufficient documentation

## 2014-03-30 NOTE — Assessment & Plan Note (Addendum)
Annual wellness as documented. Functional , no significant memory impairment, capable of ADL's.Good habits as far as diet and physical activity are concerned Consistently refuses cancer screening tests as well as dexa and f/u in carotid stenosis. Encouraged to seriously consider her stated DNR wish and if serious to have a legal document prepared, she is provided with info to f/u onthis

## 2014-04-03 ENCOUNTER — Encounter (HOSPITAL_COMMUNITY): Payer: Self-pay

## 2014-04-03 ENCOUNTER — Encounter (HOSPITAL_COMMUNITY): Payer: Medicare Other | Attending: Hematology and Oncology

## 2014-04-03 VITALS — BP 150/68 | HR 81 | Temp 98.3°F | Resp 16 | Wt 157.2 lb

## 2014-04-03 DIAGNOSIS — D509 Iron deficiency anemia, unspecified: Secondary | ICD-10-CM | POA: Insufficient documentation

## 2014-04-03 DIAGNOSIS — Z853 Personal history of malignant neoplasm of breast: Secondary | ICD-10-CM

## 2014-04-03 DIAGNOSIS — C50919 Malignant neoplasm of unspecified site of unspecified female breast: Secondary | ICD-10-CM | POA: Insufficient documentation

## 2014-04-03 NOTE — Progress Notes (Signed)
[facility]  OFFICE PROGRESS NOTE  [name], MD [address] [place] [address]  DIAGNOSIS: Invasive ductal carcinoma of breast  Microcytic anemia  Chief Complaint  Patient presents with  . Breast cancer HER-2/neu overexpressed    CURRENT THERAPY: Watchful expectation and surveillance  INTERVAL HISTORY: Emma Dawson 80 y.o. female returns for followup of stage IIIB ductal carcinoma right breast, HER-2/neu positive the primary tumor 6 cm in size she with modified radical mastectomy on 05/06/2003 followed by doxorubicin and Cytoxan for 6 cycles followed by 4 cycles of Taxotere and 48 weeks of Herceptin. Last PET scan done in June of 2006 showed no evidence of disease.  She continues to do well. She admits to just minimal swelling of the right upper extremity. Self breast examination is unremarkable. She denies a sore throat, cough, wheezing, nausea, vomiting, diarrhea, constipation, melena, hematochezia, hematuria, vaginal bleeding or discharge, dysuria, incontinence, lower extremity swelling or redness, PND, orthopnea, palpitations, headache, or peripheral paresthesias.  MEDICAL HISTORY: Past Medical History  Diagnosis Date  . Normocytic anemia   . History of chemotherapy     And radiation secondary to breast cancer  . Osteoarthritis   . Hypertension   . Hyperlipidemia   . History of breast cancer   . Allergic rhinitis   . Port catheter in place 03/05/2013  . Cancer 03/2003    radiation and chemo [facility]  . IGT (impaired glucose tolerance) 2014    DIET MANAGED    INTERIM HISTORY: has GENITAL HERPES; HYPERLIPIDEMIA; ANEMIA, NORMOCYTIC; HYPERTENSION; CAROTID ARTERY STENOSIS, LEFT; ALLERGIC RHINITIS; OSTEOARTHRITIS; SYNCOPE, VASOVAGAL; CARDIAC MURMUR; Invasive ductal carcinoma of breast; Port catheter in place; Personal history of noncompliance with medical treatment, presenting hazards to health; and Routine general  medical examination at a health care facility on her problem list.    Stage IIIB advanced invasive ductal carcinoma of the right breast presenting with a T4 B. N1 M0 cancer that was 6 cm in size with 8/9 lymph nodes positive for metastatic disease. The tumor was HER-2/neu positive, ER/PR negative. She is status post modified radical mastectomy on 05/06/2003 by Dr. Irving Shows. She then underwent adjuvant chemotherapy consisting of Adriamycin and Cytoxan for 6 cycles followed by 4 cycles of Taxotere. She is also status post 48 weeks of Herceptin therapy. She also received radiation to the chest wall. She completed all her therapy on 12/30/2004   ALLERGIES:  has No Known Allergies.  MEDICATIONS: has a current medication list which includes the following prescription(s): acetaminophen, amlodipine, aspirin, atorvastatin, docusate calcium, hydralazine, losartan, and multivitamin, and the following Facility-Administered Medications: sodium chloride.  SURGICAL HISTORY:  Past Surgical History  Procedure Laterality Date  . Abdominal hysterectomy  1970    Fibroid tumors, benign  . Mastectomy  2004    Right  . Breast surgery Right 05/06/2003    MR[facility]  . Portacath placement      FAMILY HISTORY: family history includes Coronary artery disease in her brother and father; Heart attack in her father and sister; Stroke in her mother.  SOCIAL HISTORY:  reports that she has never smoked. She has never used smokeless tobacco. She reports that she does not drink alcohol or use illicit drugs.  REVIEW OF SYSTEMS:  Other than that discussed above is noncontributory.  PHYSICAL EXAMINATION: ECOG PERFORMANCE STATUS: 0 - Asymptomatic  Blood pressure 150/68, pulse 81, temperature 98.3 F (36.8 C), temperature source Oral, resp. rate  16, weight 157 lb 3.2 oz (71.305 kg).  GENERAL:alert, no distress and comfortable SKIN: skin color, texture, turgor are normal, no rashes or significant lesions EYES: PERLA; Conjunctiva  are pink and non-injected, sclera clear SINUSES: No redness or tenderness over maxillary or ethmoid sinuses OROPHARYNX:no exudate, no erythema on lips, buccal mucosa, or tongue. NECK: supple, thyroid normal size, non-tender, without nodularity. No masses CHEST: Status post right mastectomy with no subcutaneous nodules. Left breast pendulous with no masses appreciated.. LifePort in place. LYMPH:  no palpable lymphadenopathy in the cervical, axillary or inguinal LUNGS: clear to auscultation and percussion with normal breathing effort HEART: regular rate & rhythm and no murmurs. ABDOMEN:abdomen soft, non-tender and normal bowel sounds MUSCULOSKELETAL:no cyanosis of digits and no clubbing. Range of motion normal. No evidence of lymphedema. NEURO: alert & oriented x 3 with fluent speech, no focal motor/sensory deficits   LABORATORY DATA: Office Visit on 03/27/2014  Component Date Value Ref Range Status  . WBC 03/27/2014 8.5  4.0 - 10.5 K/uL Final  . RBC 03/27/2014 4.41  3.87 - 5.11 MIL/uL Final  . Hemoglobin 03/27/2014 11.1* 12.0 - 15.0 g/dL Final  . HCT 03/27/2014 33.0* 36.0 - 46.0 % Final  . MCV 03/27/2014 74.8* 78.0 - 100.0 fL Final  . MCH 03/27/2014 25.2* 26.0 - 34.0 pg Final  . MCHC 03/27/2014 33.6  30.0 - 36.0 g/dL Final  . RDW 03/27/2014 15.0  11.5 - 15.5 % Final  . Platelets 03/27/2014 320  150 - 400 K/uL Final  . Neutrophils Relative % 03/27/2014 60  43 - 77 % Final  . Neutro Abs 03/27/2014 5.1  1.7 - 7.7 K/uL Final  . Lymphocytes Relative 03/27/2014 28  12 - 46 % Final  . Lymphs Abs 03/27/2014 2.4  0.7 - 4.0 K/uL Final  . Monocytes Relative 03/27/2014 6  3 - 12 % Final  . Monocytes Absolute 03/27/2014 0.5  0.1 - 1.0 K/uL Final  . Eosinophils Relative 03/27/2014 5  0 - 5 % Final  . Eosinophils Absolute 03/27/2014 0.4  0.0 - 0.7 K/uL Final  . Basophils Relative 03/27/2014 1  0 - 1 % Final  . Basophils Absolute 03/27/2014 0.1  0.0 - 0.1 K/uL Final  . Smear Review 03/27/2014  Criteria for review not met   Final  . Cholesterol 03/27/2014 175  0 - 200 mg/dL Final   Comment: ATP III Classification:                                < 200        mg/dL        Desirable                               200 - 239     mg/dL        Borderline High                               >= 240        mg/dL        High                             . Triglycerides 03/27/2014 127  <150 mg/dL Final  . HDL 03/27/2014 48  >39 mg/dL Final  .  Total CHOL/HDL Ratio 03/27/2014 3.6   Final  . VLDL 03/27/2014 25  0 - 40 mg/dL Final  . LDL Cholesterol 03/27/2014 102* 0 - 99 mg/dL Final   Comment:                            Total Cholesterol/HDL Ratio:CHD Risk                                                 Coronary Heart Disease Risk Table                                                                 Men       Women                                   1/2 Average Risk              3.4        3.3                                       Average Risk              5.0        4.4                                    2X Average Risk              9.6        7.1                                    3X Average Risk             23.4       11.0                          Use the calculated Patient Ratio above and the CHD Risk table                           to determine the patient's CHD Risk.                          ATP III Classification (LDL):                                < 100        mg/dL         Optimal  100 - 129     mg/dL         Near or Above Optimal                               130 - 159     mg/dL         Borderline High                               160 - 189     mg/dL         High                                > 190        mg/dL         Very High                             . Sodium 03/27/2014 138  135 - 145 mEq/L Final  . Potassium 03/27/2014 4.8  3.5 - 5.3 mEq/L Final  . Chloride 03/27/2014 103  96 - 112 mEq/L Final  . CO2 03/27/2014 26  19 - 32 mEq/L Final  .  Glucose, Bld 03/27/2014 102* 70 - 99 mg/dL Final  . BUN 03/27/2014 14  6 - 23 mg/dL Final  . Creat 03/27/2014 1.11* 0.50 - 1.10 mg/dL Final  . Total Bilirubin 03/27/2014 0.6  0.2 - 1.2 mg/dL Final  . Alkaline Phosphatase 03/27/2014 52  39 - 117 U/L Final  . AST 03/27/2014 18  0 - 37 U/L Final  . ALT 03/27/2014 12  0 - 35 U/L Final  . Total Protein 03/27/2014 7.3  6.0 - 8.3 g/dL Final  . Albumin 03/27/2014 4.4  3.5 - 5.2 g/dL Final  . Calcium 03/27/2014 9.6  8.4 - 10.5 mg/dL Final  . Hemoglobin A1C 03/27/2014 5.9* <5.7 % Final   Comment:                                                                                                 According to the ADA Clinical Practice Recommendations for 2011, when                          HbA1c is used as a screening test:                                                       >=6.5%   Diagnostic of Diabetes Mellitus                                     (if abnormal result  is confirmed)                                                     5.7-6.4%   Increased risk of developing Diabetes Mellitus                                                     References:Diagnosis and Classification of Diabetes Mellitus,Diabetes                          HYHO,8875,79(JKQAS 1):S62-S69 and Standards of Medical Care in                                  Diabetes - 2011,Diabetes UORV,6153,79 (Suppl 1):S11-S61.                             . Mean Plasma Glucose 03/27/2014 123* <117 mg/dL Final  . TSH 03/27/2014 0.915  0.350 - 4.500 uIU/mL Final    PATHOLOGY: No new pathology.  Urinalysis    Component Value Date/Time   COLORURINE yellow 09/03/2008 0000   APPEARANCEUR Cloudy 09/03/2008 0000   LABSPEC 1.015 09/03/2008 0000   PHURINE 6.5 09/03/2008 0000   GLUCOSEU NEGATIVE 08/05/2008 1819   HGBUR trace-lysed 09/03/2008 0000   HGBUR TRACE* 08/05/2008 1819   BILIRUBINUR negative 09/03/2008 0000   KETONESUR NEGATIVE 08/05/2008 1819   PROTEINUR NEGATIVE 08/05/2008 1819    UROBILINOGEN 0.2 09/03/2008 0000   NITRITE negative 09/03/2008 0000   LEUKOCYTESUR SMALL* 08/05/2008 1819    RADIOGRAPHIC STUDIES: No results found.  ASSESSMENT:  #1.Stage IIIB advanced invasive ductal carcinoma of the right breast presenting with a T4 B. N1 M0 cancer that was 6 cm in size with 8/9 lymph nodes positive for metastatic disease. The tumor was HER-2/neu positive, ER/PR negative. She is status post modified radical mastectomy on 05/06/2003 by Dr. Irving Shows. She then underwent adjuvant chemotherapy consisting of Adriamycin and Cytoxan for 6 cycles followed by 4 cycles of Taxotere. She is also status post 48 weeks of Herceptin therapy. She also received radiation to the chest wall. She completed all her therapy on 12/30/2004, no evidence of disease. #2. Refuses yearly mammography.   PLAN:  #1. Continue watchful expectation and surveillance. #2. Port flush every 6 weeks. Patient does not wish to remove it. #3. Office visit one year with CBC, chem profile, CA 2729, and CEA.   All questions were answered. The patient knows to call the clinic with any problems, questions or concerns. We can certainly see the patient much sooner if necessary.   I spent 25 minutes counseling the patient face to face. The total time spent in the appointment was 30 minutes.    Farrel Gobble, MD 04/03/2014 11:11 AM  DISCLAIMER:  This note was dictated with voice recognition software.  Similar sounding words can inadvertently be transcribed inaccurately and may not be corrected upon review.

## 2014-04-03 NOTE — Patient Instructions (Signed)
[facility] Discharge Instructions  RECOMMENDATIONS MADE BY THE CONSULTANT AND ANY TEST RESULTS WILL BE SENT TO YOUR REFERRING PHYSICIAN.  EXAM FINDINGS BY THE PHYSICIAN TODAY AND SIGNS OR SYMPTOMS TO REPORT TO CLINIC OR PRIMARY PHYSICIAN: Exam and findings as discussed by Dr. Barnet Glasgow.  You are doing well.  Report any new lumps, bone pain, shortness of breath or other symptoms.  MEDICATIONS PRESCRIBED:  none  INSTRUCTIONS/FOLLOW-UP: Port flushes every 6 weeks and office visit in 1 year.  Thank you for choosing [facility] to provide your oncology and hematology care.  To afford each patient quality time with our providers, please arrive at least 15 minutes before your scheduled appointment time.  With your help, our goal is to use those 15 minutes to complete the necessary work-up to ensure our physicians have the information they need to help with your evaluation and healthcare recommendations.    Effective January 1st, 2014, we ask that you re-schedule your appointment with our physicians should you arrive 10 or more minutes late for your appointment.  We strive to give you quality time with our providers, and arriving late affects you and other patients whose appointments are after yours.    Again, thank you for choosing [facility].  Our hope is that these requests will decrease the amount of time that you wait before being seen by our physicians.       _____________________________________________________________  Should you have questions after your visit to [facility], please contact our office at [phone] between the hours of 8:30 a.m. and 5:00 p.m.  Voicemails left after 4:30 p.m. will not be returned until the following business day.  For prescription refill requests, have your pharmacy contact our office with your prescription refill request.

## 2014-04-15 ENCOUNTER — Encounter (HOSPITAL_BASED_OUTPATIENT_CLINIC_OR_DEPARTMENT_OTHER): Payer: Medicare Other

## 2014-04-15 DIAGNOSIS — D509 Iron deficiency anemia, unspecified: Secondary | ICD-10-CM | POA: Diagnosis not present

## 2014-04-15 DIAGNOSIS — Z452 Encounter for adjustment and management of vascular access device: Secondary | ICD-10-CM

## 2014-04-15 DIAGNOSIS — C50919 Malignant neoplasm of unspecified site of unspecified female breast: Secondary | ICD-10-CM

## 2014-04-15 DIAGNOSIS — Z853 Personal history of malignant neoplasm of breast: Secondary | ICD-10-CM | POA: Diagnosis not present

## 2014-04-15 LAB — FERRITIN: Ferritin: 79 ng/mL (ref 10–291)

## 2014-04-15 MED ORDER — HEPARIN SOD (PORK) LOCK FLUSH 100 UNIT/ML IV SOLN
500.0000 [IU] | Freq: Once | INTRAVENOUS | Status: AC
  Administered 2014-04-15: 500 [IU] via INTRAVENOUS

## 2014-04-15 MED ORDER — HEPARIN SOD (PORK) LOCK FLUSH 100 UNIT/ML IV SOLN
INTRAVENOUS | Status: AC
  Filled 2014-04-15: qty 5

## 2014-04-15 MED ORDER — SODIUM CHLORIDE 0.9 % IJ SOLN
10.0000 mL | INTRAMUSCULAR | Status: DC | PRN
  Administered 2014-04-15: 10 mL via INTRAVENOUS

## 2014-04-15 NOTE — Progress Notes (Signed)
Emma Dawson presented for Portacath access and flush. Portacath located left chest wall accessed with  H 20 needle. Good blood return present. Portacath flushed with 20ml NS and 500U/5ml Heparin and needle removed intact. Procedure without incident. Patient tolerated procedure well.   

## 2014-04-16 LAB — CEA: CEA: 1.5 ng/mL (ref 0.0–5.0)

## 2014-04-16 LAB — CANCER ANTIGEN 27.29: CA 27.29: 7 U/mL (ref 0–39)

## 2014-04-17 LAB — ERYTHROPOIETIN: Erythropoietin: 13.7 m[IU]/mL (ref 2.6–18.5)

## 2014-04-22 ENCOUNTER — Encounter (HOSPITAL_COMMUNITY): Payer: Medicare Other

## 2014-06-03 ENCOUNTER — Encounter (HOSPITAL_COMMUNITY): Payer: Medicare Other

## 2014-06-13 ENCOUNTER — Encounter (HOSPITAL_COMMUNITY): Payer: Medicare Other | Attending: Hematology and Oncology

## 2014-06-13 DIAGNOSIS — Z452 Encounter for adjustment and management of vascular access device: Secondary | ICD-10-CM

## 2014-06-13 DIAGNOSIS — Z853 Personal history of malignant neoplasm of breast: Secondary | ICD-10-CM | POA: Diagnosis not present

## 2014-06-13 DIAGNOSIS — D509 Iron deficiency anemia, unspecified: Secondary | ICD-10-CM | POA: Insufficient documentation

## 2014-06-13 DIAGNOSIS — C50919 Malignant neoplasm of unspecified site of unspecified female breast: Secondary | ICD-10-CM | POA: Insufficient documentation

## 2014-06-13 MED ORDER — SODIUM CHLORIDE 0.9 % IJ SOLN
10.0000 mL | INTRAMUSCULAR | Status: DC | PRN
  Administered 2014-06-13: 10 mL via INTRAVENOUS

## 2014-06-13 MED ORDER — HEPARIN SOD (PORK) LOCK FLUSH 100 UNIT/ML IV SOLN
500.0000 [IU] | Freq: Once | INTRAVENOUS | Status: AC
  Administered 2014-06-13: 500 [IU] via INTRAVENOUS
  Filled 2014-06-13: qty 5

## 2014-06-13 NOTE — Progress Notes (Signed)
Emma Dawson presented for Portacath access and flush. Portacath located left chest wall accessed with  H 20 needle.  Good blood return present. Portacath flushed with 42ml NS and 500U/79ml Heparin and needle removed intact.  Procedure tolerated well and without incident.

## 2014-07-15 ENCOUNTER — Encounter (HOSPITAL_COMMUNITY): Payer: Medicare Other | Attending: Hematology and Oncology

## 2014-07-15 DIAGNOSIS — Z853 Personal history of malignant neoplasm of breast: Secondary | ICD-10-CM | POA: Diagnosis not present

## 2014-07-15 DIAGNOSIS — Z9889 Other specified postprocedural states: Secondary | ICD-10-CM | POA: Insufficient documentation

## 2014-07-15 DIAGNOSIS — Z452 Encounter for adjustment and management of vascular access device: Secondary | ICD-10-CM

## 2014-07-15 DIAGNOSIS — Z95828 Presence of other vascular implants and grafts: Secondary | ICD-10-CM

## 2014-07-15 MED ORDER — HEPARIN SOD (PORK) LOCK FLUSH 100 UNIT/ML IV SOLN
500.0000 [IU] | Freq: Once | INTRAVENOUS | Status: AC
  Administered 2014-07-15: 500 [IU] via INTRAVENOUS

## 2014-07-15 MED ORDER — HEPARIN SOD (PORK) LOCK FLUSH 100 UNIT/ML IV SOLN
INTRAVENOUS | Status: AC
Start: 2014-07-15 — End: 2014-07-15
  Filled 2014-07-15: qty 5

## 2014-07-15 MED ORDER — SODIUM CHLORIDE 0.9 % IJ SOLN
10.0000 mL | INTRAMUSCULAR | Status: DC | PRN
Start: 2014-07-15 — End: 2014-07-15
  Administered 2014-07-15: 10 mL via INTRAVENOUS

## 2014-07-15 NOTE — Progress Notes (Signed)
Emma Dawson presented for Portacath access and flush.   Portacath located left chest wall accessed with  H 20 needle.  Good blood return present. Portacath flushed with 5ml NS and 500U/84ml Heparin and needle removed intact.  Procedure tolerated well and without incident.

## 2014-07-21 ENCOUNTER — Other Ambulatory Visit: Payer: Self-pay | Admitting: Family Medicine

## 2014-08-26 ENCOUNTER — Encounter (HOSPITAL_COMMUNITY): Payer: Medicare Other | Attending: Hematology and Oncology

## 2014-08-26 ENCOUNTER — Encounter (HOSPITAL_COMMUNITY): Payer: Medicare Other

## 2014-08-26 ENCOUNTER — Encounter (HOSPITAL_COMMUNITY): Payer: Self-pay

## 2014-08-26 VITALS — BP 128/65 | HR 83 | Temp 98.1°F | Resp 16

## 2014-08-26 DIAGNOSIS — Z23 Encounter for immunization: Secondary | ICD-10-CM | POA: Insufficient documentation

## 2014-08-26 DIAGNOSIS — Z853 Personal history of malignant neoplasm of breast: Secondary | ICD-10-CM | POA: Diagnosis not present

## 2014-08-26 DIAGNOSIS — Z9889 Other specified postprocedural states: Secondary | ICD-10-CM | POA: Insufficient documentation

## 2014-08-26 DIAGNOSIS — Z95828 Presence of other vascular implants and grafts: Secondary | ICD-10-CM

## 2014-08-26 MED ORDER — SODIUM CHLORIDE 0.9 % IJ SOLN
10.0000 mL | INTRAMUSCULAR | Status: DC | PRN
  Administered 2014-08-26: 10 mL via INTRAVENOUS

## 2014-08-26 MED ORDER — HEPARIN SOD (PORK) LOCK FLUSH 100 UNIT/ML IV SOLN
500.0000 [IU] | Freq: Once | INTRAVENOUS | Status: AC
  Administered 2014-08-26: 500 [IU] via INTRAVENOUS
  Filled 2014-08-26: qty 5

## 2014-08-26 MED ORDER — INFLUENZA VAC SPLIT QUAD 0.5 ML IM SUSY
0.5000 mL | PREFILLED_SYRINGE | Freq: Once | INTRAMUSCULAR | Status: AC
Start: 2014-08-26 — End: 2014-08-26
  Administered 2014-08-26: 0.5 mL via INTRAMUSCULAR
  Filled 2014-08-26: qty 0.5

## 2014-08-26 NOTE — Progress Notes (Signed)
Emma Dawson presents today for injection per MD orders. Flu vaccine administered IM in left Upper Arm. Administration without incident. Patient tolerated well. Emma Dawson presented for Portacath access and flush. Proper placement of portacath confirmed by CXR. Portacath located lt chest wall accessed with  H 20 needle. Good blood return present. Portacath flushed with 63ml NS and 500U/46ml Heparin and needle removed intact. Procedure without incident. Patient tolerated procedure well.

## 2014-08-27 ENCOUNTER — Encounter (HOSPITAL_COMMUNITY): Payer: Medicare Other

## 2014-08-27 ENCOUNTER — Ambulatory Visit: Payer: Medicare Other | Admitting: Family Medicine

## 2014-08-28 ENCOUNTER — Ambulatory Visit: Payer: Medicare Other | Admitting: Family Medicine

## 2014-09-29 ENCOUNTER — Other Ambulatory Visit: Payer: Self-pay | Admitting: Family Medicine

## 2014-10-07 ENCOUNTER — Encounter (HOSPITAL_COMMUNITY): Payer: Self-pay

## 2014-10-07 ENCOUNTER — Encounter (HOSPITAL_COMMUNITY): Payer: Medicare Other | Attending: Hematology and Oncology

## 2014-10-07 DIAGNOSIS — Z452 Encounter for adjustment and management of vascular access device: Secondary | ICD-10-CM | POA: Diagnosis not present

## 2014-10-07 DIAGNOSIS — Z23 Encounter for immunization: Secondary | ICD-10-CM | POA: Insufficient documentation

## 2014-10-07 DIAGNOSIS — Z853 Personal history of malignant neoplasm of breast: Secondary | ICD-10-CM | POA: Diagnosis not present

## 2014-10-07 DIAGNOSIS — Z9889 Other specified postprocedural states: Secondary | ICD-10-CM | POA: Insufficient documentation

## 2014-10-07 DIAGNOSIS — C50919 Malignant neoplasm of unspecified site of unspecified female breast: Secondary | ICD-10-CM

## 2014-10-07 LAB — COMPREHENSIVE METABOLIC PANEL
ALBUMIN: 3.8 g/dL (ref 3.5–5.2)
ALT: 9 U/L (ref 0–35)
AST: 15 U/L (ref 0–37)
Alkaline Phosphatase: 78 U/L (ref 39–117)
Anion gap: 12 (ref 5–15)
BUN: 16 mg/dL (ref 6–23)
CO2: 25 mEq/L (ref 19–32)
Calcium: 9.5 mg/dL (ref 8.4–10.5)
Chloride: 103 mEq/L (ref 96–112)
Creatinine, Ser: 1.22 mg/dL — ABNORMAL HIGH (ref 0.50–1.10)
GFR calc Af Amer: 47 mL/min — ABNORMAL LOW (ref >90)
GFR, EST NON AFRICAN AMERICAN: 41 mL/min — AB (ref >90)
Glucose, Bld: 106 mg/dL — ABNORMAL HIGH (ref 70–99)
Potassium: 4 mEq/L (ref 3.7–5.3)
Sodium: 140 mEq/L (ref 137–147)
Total Bilirubin: 0.4 mg/dL (ref 0.3–1.2)
Total Protein: 7.9 g/dL (ref 6.0–8.3)

## 2014-10-07 LAB — CBC WITH DIFFERENTIAL/PLATELET
Basophils Absolute: 0.1 10*3/uL (ref 0.0–0.1)
Basophils Relative: 1 % (ref 0–1)
EOS ABS: 0.5 10*3/uL (ref 0.0–0.7)
Eosinophils Relative: 6 % — ABNORMAL HIGH (ref 0–5)
HCT: 34 % — ABNORMAL LOW (ref 36.0–46.0)
HEMOGLOBIN: 11 g/dL — AB (ref 12.0–15.0)
LYMPHS ABS: 2.4 10*3/uL (ref 0.7–4.0)
Lymphocytes Relative: 29 % (ref 12–46)
MCH: 25.9 pg — AB (ref 26.0–34.0)
MCHC: 32.4 g/dL (ref 30.0–36.0)
MCV: 80 fL (ref 78.0–100.0)
MONO ABS: 0.5 10*3/uL (ref 0.1–1.0)
MONOS PCT: 6 % (ref 3–12)
Neutro Abs: 4.6 10*3/uL (ref 1.7–7.7)
Neutrophils Relative %: 58 % (ref 43–77)
Platelets: 301 10*3/uL (ref 150–400)
RBC: 4.25 MIL/uL (ref 3.87–5.11)
RDW: 14.2 % (ref 11.5–15.5)
WBC: 8 10*3/uL (ref 4.0–10.5)

## 2014-10-07 MED ORDER — HEPARIN SOD (PORK) LOCK FLUSH 100 UNIT/ML IV SOLN
INTRAVENOUS | Status: AC
  Filled 2014-10-07: qty 5

## 2014-10-07 MED ORDER — HEPARIN SOD (PORK) LOCK FLUSH 100 UNIT/ML IV SOLN
500.0000 [IU] | Freq: Once | INTRAVENOUS | Status: AC
  Administered 2014-10-07: 500 [IU] via INTRAVENOUS

## 2014-10-07 MED ORDER — SODIUM CHLORIDE 0.9 % IJ SOLN
10.0000 mL | INTRAMUSCULAR | Status: DC | PRN
  Administered 2014-10-07: 10 mL via INTRAVENOUS
  Filled 2014-10-07: qty 10

## 2014-10-07 NOTE — Progress Notes (Signed)
Emma Dawson presented for Portacath access and flush.  .  Portacath located left* chest wall accessed with  H 20 needle.  Sluggish blood return and  Portacath flushed with 39ml NS and 500U/81ml Heparin and needle removed intact.  Procedure tolerated well and without incident.  Labs drawn by Ulice Dash from lab.

## 2014-10-07 NOTE — Patient Instructions (Signed)
[facility] Discharge Instructions  RECOMMENDATIONS MADE BY THE CONSULTANT AND ANY TEST RESULTS WILL BE SENT TO YOUR REFERRING PHYSICIAN.  Your port was flushed today,  Please return as scheduled for port flushes.  Thank you for choosing [facility] to provide your oncology and hematology care.  To afford each patient quality time with our providers, please arrive at least 15 minutes before your scheduled appointment time.  With your help, our goal is to use those 15 minutes to complete the necessary work-up to ensure our physicians have the information they need to help with your evaluation and healthcare recommendations.    Effective January 1st, 2014, we ask that you re-schedule your appointment with our physicians should you arrive 10 or more minutes late for your appointment.  We strive to give you quality time with our providers, and arriving late affects you and other patients whose appointments are after yours.    Again, thank you for choosing [facility].  Our hope is that these requests will decrease the amount of time that you wait before being seen by our physicians.       _____________________________________________________________  Should you have questions after your visit to [facility], please contact our office at [phone] between the hours of 8:30 a.m. and 4:30 p.m.  Voicemails left after 4:30 p.m. will not be returned until the following business day.  For prescription refill requests, have your pharmacy contact our office with your prescription refill request.    _______________________________________________________________  We hope that we have given you very good care.  You may receive a patient satisfaction survey in the mail, please complete it and return it as soon as possible.  We value your feedback!  _______________________________________________________________  Have you asked about our STAR  program?  STAR stands for Survivorship Training and Rehabilitation, and this is a nationally recognized cancer care program that focuses on survivorship and rehabilitation.  Cancer and cancer treatments may cause problems, such as, pain, making you feel tired and keeping you from doing the things that you need or want to do. Cancer rehabilitation can help. Our goal is to reduce these troubling effects and help you have the best quality of life possible.  You may receive a survey from a nurse that asks questions about your current state of health.  Based on the survey results, all eligible patients will be referred to the [facility] program for an evaluation so we can better serve you!  A frequently asked questions sheet is available upon request.

## 2014-10-22 ENCOUNTER — Other Ambulatory Visit: Payer: Self-pay | Admitting: Family Medicine

## 2014-11-05 ENCOUNTER — Other Ambulatory Visit: Payer: Self-pay | Admitting: Family Medicine

## 2014-11-18 ENCOUNTER — Encounter (HOSPITAL_COMMUNITY): Payer: Medicare Other

## 2014-11-24 ENCOUNTER — Encounter (INDEPENDENT_AMBULATORY_CARE_PROVIDER_SITE_OTHER): Payer: Self-pay

## 2014-11-24 ENCOUNTER — Encounter: Payer: Self-pay | Admitting: Family Medicine

## 2014-11-24 ENCOUNTER — Ambulatory Visit (INDEPENDENT_AMBULATORY_CARE_PROVIDER_SITE_OTHER): Payer: Medicare Other | Admitting: Family Medicine

## 2014-11-24 VITALS — BP 144/68 | HR 82 | Resp 16 | Ht 62.0 in | Wt 158.0 lb

## 2014-11-24 DIAGNOSIS — Z9119 Patient's noncompliance with other medical treatment and regimen: Secondary | ICD-10-CM | POA: Diagnosis not present

## 2014-11-24 DIAGNOSIS — I10 Essential (primary) hypertension: Secondary | ICD-10-CM

## 2014-11-24 DIAGNOSIS — M1712 Unilateral primary osteoarthritis, left knee: Secondary | ICD-10-CM | POA: Diagnosis not present

## 2014-11-24 DIAGNOSIS — R7309 Other abnormal glucose: Secondary | ICD-10-CM | POA: Diagnosis not present

## 2014-11-24 DIAGNOSIS — E785 Hyperlipidemia, unspecified: Secondary | ICD-10-CM

## 2014-11-24 DIAGNOSIS — B369 Superficial mycosis, unspecified: Secondary | ICD-10-CM | POA: Diagnosis not present

## 2014-11-24 DIAGNOSIS — Z91199 Patient's noncompliance with other medical treatment and regimen due to unspecified reason: Secondary | ICD-10-CM

## 2014-11-24 DIAGNOSIS — R7303 Prediabetes: Secondary | ICD-10-CM

## 2014-11-24 MED ORDER — CLOTRIMAZOLE-BETAMETHASONE 1-0.05 % EX CREA: 1.0000 "application " | TOPICAL_CREAM | Freq: Two times a day (BID) | CUTANEOUS | Status: DC

## 2014-11-24 MED ORDER — AMLODIPINE BESYLATE 10 MG PO TABS: 10.0000 mg | ORAL_TABLET | Freq: Every day | ORAL | Status: DC

## 2014-11-24 MED ORDER — HYDRALAZINE HCL 10 MG PO TABS
10.0000 mg | ORAL_TABLET | Freq: Every day | ORAL | Status: DC
Start: 2014-11-24 — End: 2015-04-24

## 2014-11-24 MED ORDER — LOSARTAN POTASSIUM 100 MG PO TABS: 100.0000 mg | ORAL_TABLET | Freq: Every day | ORAL | Status: DC

## 2014-11-24 MED ORDER — ATORVASTATIN CALCIUM 40 MG PO TABS: 40.0000 mg | ORAL_TABLET | Freq: Every day | ORAL | Status: DC

## 2014-11-24 NOTE — Assessment & Plan Note (Signed)
Hyperlipidemia:Low fat diet discussed and encouraged.  Updated lab needed at/ before next visit.  

## 2014-11-24 NOTE — Assessment & Plan Note (Addendum)
Sub optimal control, no change in medication DASH diet and commitment to daily physical activity for a minimum of 30 minutes discussed and encouraged, as a part of hypertension management. The importance of attaining a healthy weight is also discussed.

## 2014-11-24 NOTE — Progress Notes (Signed)
   Subjective:    Patient ID: Emma Dawson, female    DOB: 09-19-1932, 81 y.o.   MRN: [id number]  HPI The PT is here for follow up and re-evaluation of chronic medical conditions, medication management and review of any available recent lab and radiology data.  Preventive health is updated, specifically  Cancer screening and Immunization.    The PT denies any adverse reactions to current medications since the last visit.  C/o itchy rash on rigth lower leg, no response to gold bond powder    Review of Systems See HPI Denies recent fever or chills. Denies sinus pressure, nasal congestion, ear pain or sore throat. Denies chest congestion, productive cough or wheezing. Denies chest pains, palpitations and leg swelling Denies abdominal pain, nausea, vomiting,diarrhea or constipation.   Denies dysuria, frequency, hesitancy or incontinence. Denies joint pain, swelling and limitation in mobility. Denies headaches, seizures, numbness, or tingling. Denies depression, anxiety or insomnia.       Objective:   Physical Exam  BP 144/68 mmHg  Pulse 82  Resp 16  Ht 5\' 2"  (1.575 m)  Wt 158 lb (71.668 kg)  BMI 28.89 kg/m2  SpO2 98% Patient alert and oriented and in no cardiopulmonary distress.  HEENT: No facial asymmetry, EOMI,   oropharynx pink and moist.  Neck supple no JVD, no mass.  Chest: Clear to auscultation bilaterally.  CVS: S1, S2 no murmurs, no S3.Regular rate.  ABD: Soft non tender.   Ext: No edema  MS: Adequate ROM spine, shoulders, hips and knees. Skin: Intact, fungal rash max diameter noted on right lower leg , near to the ankle, max dianeter approx 2 in  Psych: Good eye contact, normal affect. Memory intact not anxious or depressed appearing.  CNS: CN 2-12 intact, power,  normal throughout.no focal deficits noted.       Assessment & Plan:  Essential hypertension Sub optimal control, no change in medication DASH diet and commitment to daily physical activity  for a minimum of 30 minutes discussed and encouraged, as a part of hypertension management. The importance of attaining a healthy weight is also discussed.    Dermatomycosis 2 month h/o itchy rash, on right foot, getting larger  Hyperlipidemia LDL goal <100 Hyperlipidemia:Low fat diet discussed and encouraged.  Updated lab needed at/ before next visit.   Personal history of noncompliance with medical treatment, presenting hazards to health Pt continues to refuse vaccines and basic cancer screening tests also annual examination despite education  Osteoarthritis Not disabling , no h/o falls or stiffness, uses tylenol as needed

## 2014-11-24 NOTE — Assessment & Plan Note (Signed)
2 month h/o itchy rash, on right foot, getting larger

## 2014-11-24 NOTE — Assessment & Plan Note (Signed)
Not disabling , no h/o falls or stiffness, uses tylenol as needed

## 2014-11-24 NOTE — Patient Instructions (Addendum)
Annual wellness May 20 or after  Call if you need me before  Fasting  lipid, cmp and EGFr, HBA1C  Please reconsider the basic screening exams for cancer and  Bone density test  Keep active and keep eating healthily  Cream sent for rash on right leg

## 2014-11-24 NOTE — Assessment & Plan Note (Signed)
Pt continues to refuse vaccines and basic cancer screening tests also annual examination despite education

## 2014-11-26 ENCOUNTER — Encounter (HOSPITAL_COMMUNITY): Payer: Medicare Other

## 2014-12-02 ENCOUNTER — Encounter (HOSPITAL_COMMUNITY): Payer: Medicare Other | Attending: Hematology and Oncology

## 2014-12-02 ENCOUNTER — Encounter (HOSPITAL_COMMUNITY): Payer: Self-pay

## 2014-12-02 VITALS — BP 125/55 | HR 73 | Temp 98.4°F | Resp 18

## 2014-12-02 DIAGNOSIS — Z452 Encounter for adjustment and management of vascular access device: Secondary | ICD-10-CM

## 2014-12-02 DIAGNOSIS — R7309 Other abnormal glucose: Secondary | ICD-10-CM | POA: Diagnosis not present

## 2014-12-02 DIAGNOSIS — Z23 Encounter for immunization: Secondary | ICD-10-CM | POA: Insufficient documentation

## 2014-12-02 DIAGNOSIS — E785 Hyperlipidemia, unspecified: Secondary | ICD-10-CM | POA: Diagnosis not present

## 2014-12-02 DIAGNOSIS — Z9889 Other specified postprocedural states: Secondary | ICD-10-CM | POA: Insufficient documentation

## 2014-12-02 DIAGNOSIS — Z95828 Presence of other vascular implants and grafts: Secondary | ICD-10-CM

## 2014-12-02 DIAGNOSIS — Z853 Personal history of malignant neoplasm of breast: Secondary | ICD-10-CM

## 2014-12-02 DIAGNOSIS — I10 Essential (primary) hypertension: Secondary | ICD-10-CM | POA: Diagnosis not present

## 2014-12-02 MED ORDER — SODIUM CHLORIDE 0.9 % IJ SOLN
10.0000 mL | INTRAMUSCULAR | Status: DC | PRN
  Administered 2014-12-02: 10 mL via INTRAVENOUS
  Filled 2014-12-02: qty 10

## 2014-12-02 MED ORDER — HEPARIN SOD (PORK) LOCK FLUSH 100 UNIT/ML IV SOLN
500.0000 [IU] | Freq: Once | INTRAVENOUS | Status: AC
  Administered 2014-12-02: 500 [IU] via INTRAVENOUS

## 2014-12-02 MED ORDER — HEPARIN SOD (PORK) LOCK FLUSH 100 UNIT/ML IV SOLN
INTRAVENOUS | Status: AC
  Filled 2014-12-02: qty 5

## 2014-12-02 NOTE — Progress Notes (Signed)
Emma Dawson presented for Portacath access and flush. Proper placement of portacath confirmed by CXR. Portacath located lt chest wall accessed with  H 20 needle. Good blood return present. Portacath flushed with 73ml NS and 500U/70ml Heparin and needle removed intact. Procedure without incident. Patient tolerated procedure well.

## 2014-12-03 LAB — LIPID PANEL
CHOLESTEROL: 213 mg/dL — AB (ref 0–200)
HDL: 54 mg/dL (ref >39)
LDL Cholesterol: 125 mg/dL — ABNORMAL HIGH (ref 0–99)
TRIGLYCERIDES: 171 mg/dL — AB (ref <150)
Total CHOL/HDL Ratio: 3.9 Ratio
VLDL: 34 mg/dL (ref 0–40)

## 2014-12-03 LAB — COMPLETE METABOLIC PANEL WITH GFR
ALT: 13 U/L (ref 0–35)
AST: 19 U/L (ref 0–37)
Albumin: 3.9 g/dL (ref 3.5–5.2)
Alkaline Phosphatase: 54 U/L (ref 39–117)
BUN: 13 mg/dL (ref 6–23)
CALCIUM: 9.5 mg/dL (ref 8.4–10.5)
CHLORIDE: 104 meq/L (ref 96–112)
CO2: 28 meq/L (ref 19–32)
CREATININE: 0.9 mg/dL (ref 0.50–1.10)
GFR, EST AFRICAN AMERICAN: 70 mL/min
GFR, EST NON AFRICAN AMERICAN: 61 mL/min
Glucose, Bld: 90 mg/dL (ref 70–99)
Potassium: 4.4 mEq/L (ref 3.5–5.3)
Sodium: 139 mEq/L (ref 135–145)
Total Bilirubin: 0.5 mg/dL (ref 0.2–1.2)
Total Protein: 7.4 g/dL (ref 6.0–8.3)

## 2014-12-03 LAB — HEMOGLOBIN A1C
HEMOGLOBIN A1C: 6 % — AB (ref <5.7)
MEAN PLASMA GLUCOSE: 126 mg/dL — AB (ref <117)

## 2014-12-08 NOTE — Addendum Note (Signed)
Addended by: Denman George B on: 12/08/2014 02:46 PM   Modules accepted: Orders

## 2015-01-13 ENCOUNTER — Encounter (HOSPITAL_COMMUNITY): Payer: Medicare Other

## 2015-01-22 ENCOUNTER — Encounter (HOSPITAL_COMMUNITY): Payer: Medicare Other | Attending: Hematology and Oncology

## 2015-01-22 ENCOUNTER — Encounter (HOSPITAL_COMMUNITY): Payer: Self-pay

## 2015-01-22 DIAGNOSIS — Z9889 Other specified postprocedural states: Secondary | ICD-10-CM | POA: Insufficient documentation

## 2015-01-22 DIAGNOSIS — Z452 Encounter for adjustment and management of vascular access device: Secondary | ICD-10-CM | POA: Diagnosis not present

## 2015-01-22 DIAGNOSIS — Z23 Encounter for immunization: Secondary | ICD-10-CM | POA: Insufficient documentation

## 2015-01-22 DIAGNOSIS — Z853 Personal history of malignant neoplasm of breast: Secondary | ICD-10-CM | POA: Diagnosis not present

## 2015-01-22 MED ORDER — HEPARIN SOD (PORK) LOCK FLUSH 100 UNIT/ML IV SOLN
500.0000 [IU] | Freq: Once | INTRAVENOUS | Status: AC
  Administered 2015-01-22: 500 [IU] via INTRAVENOUS
  Filled 2015-01-22: qty 5

## 2015-01-22 MED ORDER — SODIUM CHLORIDE 0.9 % IJ SOLN
10.0000 mL | INTRAMUSCULAR | Status: DC | PRN
  Administered 2015-01-22: 10 mL via INTRAVENOUS
  Filled 2015-01-22: qty 10

## 2015-01-22 NOTE — Progress Notes (Signed)
Emma Dawson presented for Portacath access and flush.  Portacath located rt chest wall accessed with  H 20 needle. Good blood return present. Portacath flushed with 66ml NS and 500U/99ml Heparin and needle removed intact.. Patient tolerated procedure well.

## 2015-02-24 ENCOUNTER — Encounter (HOSPITAL_COMMUNITY): Payer: Medicare Other | Attending: Hematology & Oncology

## 2015-02-24 VITALS — BP 132/62 | HR 83 | Temp 98.5°F | Resp 16

## 2015-02-24 DIAGNOSIS — Z853 Personal history of malignant neoplasm of breast: Secondary | ICD-10-CM

## 2015-02-24 DIAGNOSIS — Z95828 Presence of other vascular implants and grafts: Secondary | ICD-10-CM

## 2015-02-24 DIAGNOSIS — Z452 Encounter for adjustment and management of vascular access device: Secondary | ICD-10-CM | POA: Diagnosis not present

## 2015-02-24 MED ORDER — SODIUM CHLORIDE 0.9 % IJ SOLN
10.0000 mL | Freq: Once | INTRAMUSCULAR | Status: AC
  Administered 2015-02-24: 10 mL via INTRAVENOUS

## 2015-02-24 MED ORDER — HEPARIN SOD (PORK) LOCK FLUSH 100 UNIT/ML IV SOLN
500.0000 [IU] | Freq: Once | INTRAVENOUS | Status: AC
  Administered 2015-02-24: 500 [IU] via INTRAVENOUS
  Filled 2015-02-24: qty 5

## 2015-02-24 NOTE — Progress Notes (Signed)
Emma Dawson presented for Portacath access and flush. Proper placement of portacath confirmed by CXR. Portacath located left chest wall accessed with  H 20 needle. Good blood return present. Portacath flushed with 33ml NS and 500U/40ml Heparin and needle removed intact. Procedure without incident. Patient tolerated procedure well.

## 2015-02-24 NOTE — Patient Instructions (Signed)
[facility] Discharge Instructions  RECOMMENDATIONS MADE BY THE CONSULTANT AND ANY TEST RESULTS WILL BE SENT TO YOUR REFERRING PHYSICIAN.  Port flush today as scheduled. Continue port flush every 6 weeks. Return as scheduled.  Thank you for choosing  [facility] to provide your oncology and hematology care.  To afford each patient quality time with our provider, please arrive at least 15 minutes before your scheduled appointment time.    You need to re-schedule your appointment should you arrive 10 or more minutes late.  We strive to give you quality time with our providers, and arriving late affects you and other patients whose appointments are after yours.  Also, if you no show three or more times for appointments you may be dismissed from the clinic at the providers discretion.     Again, thank you for choosing [facility].  Our hope is that these requests will decrease the amount of time that you wait before being seen by our physicians.       _____________________________________________________________  Should you have questions after your visit to [facility], please contact our office at [phone] between the hours of 8:30 a.m. and 4:30 p.m.  Voicemails left after 4:30 p.m. will not be returned until the following business day.  For prescription refill requests, have your pharmacy contact our office.    

## 2015-04-03 ENCOUNTER — Encounter (HOSPITAL_COMMUNITY): Payer: Medicare Other | Attending: Hematology & Oncology

## 2015-04-03 ENCOUNTER — Ambulatory Visit (HOSPITAL_COMMUNITY): Payer: Medicare Other | Admitting: Hematology & Oncology

## 2015-04-03 ENCOUNTER — Encounter (HOSPITAL_COMMUNITY): Payer: Self-pay | Admitting: Hematology & Oncology

## 2015-04-03 ENCOUNTER — Encounter (HOSPITAL_COMMUNITY): Payer: Medicare Other | Attending: Hematology and Oncology | Admitting: Hematology & Oncology

## 2015-04-03 VITALS — BP 146/57 | HR 83 | Temp 99.0°F | Resp 16 | Wt 155.5 lb

## 2015-04-03 DIAGNOSIS — C50911 Malignant neoplasm of unspecified site of right female breast: Secondary | ICD-10-CM

## 2015-04-03 DIAGNOSIS — N183 Chronic kidney disease, stage 3 (moderate): Secondary | ICD-10-CM | POA: Diagnosis not present

## 2015-04-03 DIAGNOSIS — Z9889 Other specified postprocedural states: Secondary | ICD-10-CM | POA: Diagnosis not present

## 2015-04-03 DIAGNOSIS — Z853 Personal history of malignant neoplasm of breast: Secondary | ICD-10-CM

## 2015-04-03 DIAGNOSIS — Z23 Encounter for immunization: Secondary | ICD-10-CM | POA: Diagnosis not present

## 2015-04-03 LAB — CBC WITH DIFFERENTIAL/PLATELET
Basophils Absolute: 0.1 10*3/uL (ref 0.0–0.1)
Basophils Relative: 1 % (ref 0–1)
EOS ABS: 0.4 10*3/uL (ref 0.0–0.7)
Eosinophils Relative: 5 % (ref 0–5)
HCT: 32.7 % — ABNORMAL LOW (ref 36.0–46.0)
HEMOGLOBIN: 10.3 g/dL — AB (ref 12.0–15.0)
LYMPHS ABS: 2.4 10*3/uL (ref 0.7–4.0)
Lymphocytes Relative: 29 % (ref 12–46)
MCH: 25.5 pg — ABNORMAL LOW (ref 26.0–34.0)
MCHC: 31.5 g/dL (ref 30.0–36.0)
MCV: 80.9 fL (ref 78.0–100.0)
Monocytes Absolute: 0.7 10*3/uL (ref 0.1–1.0)
Monocytes Relative: 9 % (ref 3–12)
NEUTROS PCT: 56 % (ref 43–77)
Neutro Abs: 4.7 10*3/uL (ref 1.7–7.7)
PLATELETS: 332 10*3/uL (ref 150–400)
RBC: 4.04 MIL/uL (ref 3.87–5.11)
RDW: 14.2 % (ref 11.5–15.5)
WBC: 8.3 10*3/uL (ref 4.0–10.5)

## 2015-04-03 LAB — COMPREHENSIVE METABOLIC PANEL
ALT: 11 U/L — ABNORMAL LOW (ref 14–54)
AST: 18 U/L (ref 15–41)
Albumin: 4 g/dL (ref 3.5–5.0)
Alkaline Phosphatase: 52 U/L (ref 38–126)
Anion gap: 7 (ref 5–15)
BUN: 17 mg/dL (ref 6–20)
CALCIUM: 9.1 mg/dL (ref 8.9–10.3)
CO2: 27 mmol/L (ref 22–32)
Chloride: 104 mmol/L (ref 101–111)
Creatinine, Ser: 1.26 mg/dL — ABNORMAL HIGH (ref 0.44–1.00)
GFR calc non Af Amer: 39 mL/min — ABNORMAL LOW (ref >60)
GFR, EST AFRICAN AMERICAN: 45 mL/min — AB (ref >60)
Glucose, Bld: 99 mg/dL (ref 65–99)
Potassium: 4.2 mmol/L (ref 3.5–5.1)
Sodium: 138 mmol/L (ref 135–145)
Total Bilirubin: 0.6 mg/dL (ref 0.3–1.2)
Total Protein: 8 g/dL (ref 6.5–8.1)

## 2015-04-03 MED ORDER — HEPARIN SOD (PORK) LOCK FLUSH 100 UNIT/ML IV SOLN
INTRAVENOUS | Status: AC
  Filled 2015-04-03: qty 5

## 2015-04-03 MED ORDER — HEPARIN SOD (PORK) LOCK FLUSH 100 UNIT/ML IV SOLN: 500.0000 [IU] | Freq: Once | INTRAVENOUS | Status: DC

## 2015-04-03 MED ORDER — HEPARIN SOD (PORK) LOCK FLUSH 100 UNIT/ML IV SOLN
500.0000 [IU] | Freq: Once | INTRAVENOUS | Status: AC
  Administered 2015-04-03: 500 [IU] via INTRAVENOUS

## 2015-04-03 MED ORDER — SODIUM CHLORIDE 0.9 % IJ SOLN
10.0000 mL | INTRAMUSCULAR | Status: DC | PRN
  Administered 2015-04-03: 10 mL via INTRAVENOUS
  Filled 2015-04-03: qty 10

## 2015-04-03 MED ORDER — SODIUM CHLORIDE 0.9 % IJ SOLN: 10.0000 mL | INTRAMUSCULAR | Status: DC | PRN

## 2015-04-03 NOTE — Patient Instructions (Signed)
[place] at [facility] Discharge Instructions  RECOMMENDATIONS MADE BY THE CONSULTANT AND ANY TEST RESULTS WILL BE SENT TO YOUR REFERRING PHYSICIAN.  Exam and discussion by Dr. Whitney Muse. You are doing well Report any new lumps, bone pain, shortness of breath or other symptoms.  Port flushes every 6 weeks Office visit in 1 year.  Thank you for choosing [place] at [facility] to provide your oncology and hematology care.  To afford each patient quality time with our provider, please arrive at least 15 minutes before your scheduled appointment time.    You need to re-schedule your appointment should you arrive 10 or more minutes late.  We strive to give you quality time with our providers, and arriving late affects you and other patients whose appointments are after yours.  Also, if you no show three or more times for appointments you may be dismissed from the clinic at the providers discretion.     Again, thank you for choosing [facility].  Our hope is that these requests will decrease the amount of time that you wait before being seen by our physicians.       _____________________________________________________________  Should you have questions after your visit to [facility], please contact our office at [phone] between the hours of 8:30 a.m. and 4:30 p.m.  Voicemails left after 4:30 p.m. will not be returned until the following business day.  For prescription refill requests, have your pharmacy contact our office.

## 2015-04-03 NOTE — Progress Notes (Signed)
..  Emma Dawson presented for Portacath access and flush.  Portacath located lt chest wall accessed with  H 20 needle.  Good blood return present. Portacath flushed with 99ml NS and 500U/35ml Heparin and needle removed intact.  Procedure tolerated well and without incident.

## 2015-04-03 NOTE — Progress Notes (Signed)
Emma Nakayama, MD [address] / [place] [address]   Chronic kidney disease stage 3  stage IIIB ductal carcinoma right breast, HER-2/neu positive, ER- PR-  the primary tumor 6 cm in size she with modified radical mastectomy on 05/06/2003 followed by doxorubicin and Cytoxan for 6 cycles followed by 4 cycles of Taxotere and 48 weeks of Herceptin  DIAGNOSIS: Invasive ductal carcinoma of breast   Staging form: Breast, AJCC 6th Edition     Clinical: Stage IIIB - Signed by Baird Cancer, PA on 03/23/2012   CURRENT THERAPY: Observation  INTERVAL HISTORY: Emma Dawson 81 y.o. female returns for follow up. She is well and has no major complaints. She does not have mammograms any longer. She does not mind coming to the doctor yearly as long as she does not have to pay for it.  She has a garden, she is very active. She was one of 10 children and has 3 siblings that are still living. They are fairly close.  She's doing well with no complaints.  MEDICAL HISTORY: Past Medical History  Diagnosis Date  . Normocytic anemia   . History of chemotherapy     And radiation secondary to breast cancer  . Osteoarthritis   . Hypertension   . Hyperlipidemia   . History of breast cancer   . Allergic rhinitis   . Port catheter in place 03/05/2013  . Cancer 03/2003    radiation and chemo [facility]  . IGT (impaired glucose tolerance) 2014    DIET MANAGED    has GENITAL HERPES; Hyperlipidemia LDL goal <100; ANEMIA, NORMOCYTIC; Essential hypertension; CAROTID ARTERY STENOSIS, LEFT; ALLERGIC RHINITIS; Osteoarthritis; SYNCOPE, VASOVAGAL; CARDIAC MURMUR; Invasive ductal carcinoma of breast; Port catheter in place; Personal history of noncompliance with medical treatment, presenting hazards to health; Routine general medical examination at a health care facility; and Dermatomycosis on her problem list.     has No Known Allergies.   Current Outpatient Prescriptions on File Prior to Visit   Medication Sig Dispense Refill  . acetaminophen (TYLENOL) 500 MG tablet One tablet twice daily as needed for knee pain 60 tablet 2  . amLODipine (NORVASC) 10 MG tablet Take 1 tablet (10 mg total) by mouth daily. 30 tablet 4  . aspirin 81 MG tablet Take 81 mg by mouth daily.     Marland Kitchen atorvastatin (LIPITOR) 40 MG tablet Take 1 tablet (40 mg total) by mouth daily. 30 tablet 4  . Docusate Calcium (STOOL SOFTENER PO) Take 1 capsule by mouth as needed.    . hydrALAZINE (APRESOLINE) 10 MG tablet Take 1 tablet (10 mg total) by mouth at bedtime. 30 tablet 4  . losartan (COZAAR) 100 MG tablet Take 1 tablet (100 mg total) by mouth daily. 30 tablet 4  . Multiple Vitamin (MULTIVITAMIN) tablet Take 1 tablet by mouth daily.    . clotrimazole-betamethasone (LOTRISONE) cream Apply 1 application topically 2 (two) times daily. Apply twice daily to rash for 2 weeks, then as needed (Patient not taking: Reported on 04/03/2015) 45 g 0   Current Facility-Administered Medications on File Prior to Visit  Medication Dose Route Frequency Provider Last Rate Last Dose  . sodium chloride 0.9 % injection 10 mL  10 mL Intravenous PRN Baird Cancer, PA-C   10 mL at 03/23/12 2707     SURGICAL HISTORY: Past Surgical History  Procedure Laterality Date  . Mastectomy  2004    Right  . Breast surgery Right  05/06/2003    MRM  . Portacath placement    . Abdominal hysterectomy  1970    Fibroid tumors, benign    SOCIAL HISTORY: History   Social History  . Marital Status: Widowed    Spouse Name: N/A  . Number of Children: N/A  . Years of Education: N/A   Occupational History  . Homemaker    Social History Main Topics  . Smoking status: Never Smoker   . Smokeless tobacco: Never Used  . Alcohol Use: No  . Drug Use: No  . Sexual Activity: Not Currently   Other Topics Concern  . Not on file   Social History Narrative   Widowed with boyfriend   Lives by herself and does own dressing and bathing   Has a  caretaker who lives with her   12th grade education    FAMILY HISTORY: Family History  Problem Relation Age of Onset  . Stroke Mother   . Heart attack Father   . Coronary artery disease Father   . Coronary artery disease Brother   . Heart attack Sister     Review of Systems  Constitutional: Negative for fever, chills and weight loss.  HENT: Negative for hearing loss.   Eyes: Negative for blurred vision and double vision.  Respiratory: Negative for cough.   Cardiovascular: Negative for chest pain and palpitations.  Gastrointestinal: Negative for nausea, vomiting and abdominal pain.  Genitourinary: Negative for dysuria and hematuria.  Musculoskeletal: Negative for myalgias.  Skin: Negative for rash.  Neurological: Negative for dizziness, tingling and headaches.  Psychiatric/Behavioral: Negative for depression.    PHYSICAL EXAMINATION  ECOG PERFORMANCE STATUS: 0 - Asymptomatic  Filed Vitals:   04/03/15 1018  BP: 146/57  Pulse: 83  Temp: 99 F (37.2 C)  Resp: 16    Physical Exam  Constitutional: She is oriented to person, place, and time and well-developed, well-nourished, and in no distress.  HENT:  Head: Normocephalic and atraumatic.  Nose: Nose normal.  Mouth/Throat: Oropharynx is clear and moist. No oropharyngeal exudate.  Eyes: Conjunctivae and EOM are normal. Pupils are equal, round, and reactive to light. Right eye exhibits no discharge. Left eye exhibits no discharge. No scleral icterus.  Neck: Normal range of motion. Neck supple. No tracheal deviation present. No thyromegaly present.  Cardiovascular: Normal rate and regular rhythm.  Exam reveals no gallop and no friction rub.   Murmur heard. Pulmonary/Chest: Effort normal and breath sounds normal. She has no wheezes. She has no rales.    Abdominal: Soft. Bowel sounds are normal. She exhibits no distension and no mass. There is no tenderness. There is no rebound and no guarding.  Musculoskeletal: Normal  range of motion. She exhibits no edema.  Lymphadenopathy:    She has no cervical adenopathy.  Neurological: She is alert and oriented to person, place, and time. She has normal reflexes. No cranial nerve deficit. Gait normal. Coordination normal.  Skin: Skin is warm and dry. No rash noted.  Psychiatric: Mood, memory, affect and judgment normal.  Nursing note and vitals reviewed.   LABORATORY DATA:  CBC    Component Value Date/Time   WBC 8.0 10/07/2014 0930   RBC 4.25 10/07/2014 0930   HGB 11.0* 10/07/2014 0930   HCT 34.0* 10/07/2014 0930   PLT 301 10/07/2014 0930   MCV 80.0 10/07/2014 0930   MCH 25.9* 10/07/2014 0930   MCHC 32.4 10/07/2014 0930   RDW 14.2 10/07/2014 0930   LYMPHSABS 2.4 10/07/2014 0930   MONOABS 0.5  10/07/2014 0930   EOSABS 0.5 10/07/2014 0930   BASOSABS 0.1 10/07/2014 0930   CMP     Component Value Date/Time   NA 139 12/02/2014 0808   K 4.4 12/02/2014 0808   CL 104 12/02/2014 0808   CO2 28 12/02/2014 0808   GLUCOSE 90 12/02/2014 0808   BUN 13 12/02/2014 0808   CREATININE 0.90 12/02/2014 0808   CREATININE 1.22* 10/07/2014 0930   CALCIUM 9.5 12/02/2014 0808   PROT 7.4 12/02/2014 0808   ALBUMIN 3.9 12/02/2014 0808   AST 19 12/02/2014 0808   ALT 13 12/02/2014 0808   ALKPHOS 54 12/02/2014 0808   BILITOT 0.5 12/02/2014 0808   GFRNONAA 61 12/02/2014 0808   GFRNONAA 41* 10/07/2014 0930   GFRAA 70 12/02/2014 0808   GFRAA 47* 10/07/2014 0930     ASSESSMENT and THERAPY PLAN:  Stage IIIB ductal carcinoma right breast, HER-2/neu positive, ER- PR-  the primary tumor 6 cm in size she with modified radical mastectomy on 05/06/2003 followed by doxorubicin and Cytoxan for 6 cycles followed by 4 cycles of Taxotere and 48 weeks of Herceptin  She is doing quite well. She is very independent and active. She has no evidence of recurrent disease. She no longer wishes to get mammograms of the left breast. Breast exam was unremarkable today. She would like to continue  with yearly observation therefore we will plan on seeing her back next year with a repeat physical exam. We will also obtain CBC and CMP given her prior chemotherapy history. She was given a prescription for mastectomy bras and prosthesis. Follow up in 1 year.  All questions were answered. The patient knows to call the clinic with any problems, questions or concerns. We can certainly see the patient much sooner if necessary. This note was electronically signed.  This document serves as a record of services personally performed by Ancil Linsey, MD. It was created on her behalf by Pearlie Oyster, a trained medical scribe. The creation of this record is based on the scribe's personal observations and the provider's statements to them. This document has been checked and approved by the attending provider.    I have reviewed the above documentation for accuracy and completeness, and I agree with the above.  Kelby Fam. Adelyn Roscher MD

## 2015-04-07 ENCOUNTER — Encounter (HOSPITAL_COMMUNITY): Payer: Medicare Other

## 2015-04-14 ENCOUNTER — Encounter: Payer: Medicare Other | Admitting: Family Medicine

## 2015-04-24 ENCOUNTER — Other Ambulatory Visit: Payer: Self-pay | Admitting: Family Medicine

## 2015-04-28 ENCOUNTER — Other Ambulatory Visit: Payer: Self-pay | Admitting: Family Medicine

## 2015-05-07 ENCOUNTER — Ambulatory Visit (INDEPENDENT_AMBULATORY_CARE_PROVIDER_SITE_OTHER): Payer: Medicare Other | Admitting: Family Medicine

## 2015-05-07 ENCOUNTER — Encounter: Payer: Self-pay | Admitting: Family Medicine

## 2015-05-07 VITALS — BP 150/62 | HR 85 | Resp 16 | Ht 62.0 in | Wt 154.1 lb

## 2015-05-07 DIAGNOSIS — I10 Essential (primary) hypertension: Secondary | ICD-10-CM | POA: Diagnosis not present

## 2015-05-07 DIAGNOSIS — E785 Hyperlipidemia, unspecified: Secondary | ICD-10-CM

## 2015-05-07 DIAGNOSIS — J3089 Other allergic rhinitis: Secondary | ICD-10-CM

## 2015-05-07 DIAGNOSIS — A6000 Herpesviral infection of urogenital system, unspecified: Secondary | ICD-10-CM

## 2015-05-07 DIAGNOSIS — D649 Anemia, unspecified: Secondary | ICD-10-CM | POA: Diagnosis not present

## 2015-05-07 DIAGNOSIS — R7309 Other abnormal glucose: Secondary | ICD-10-CM

## 2015-05-07 DIAGNOSIS — Z91199 Patient's noncompliance with other medical treatment and regimen due to unspecified reason: Secondary | ICD-10-CM

## 2015-05-07 DIAGNOSIS — M1712 Unilateral primary osteoarthritis, left knee: Secondary | ICD-10-CM

## 2015-05-07 DIAGNOSIS — Z9119 Patient's noncompliance with other medical treatment and regimen: Secondary | ICD-10-CM

## 2015-05-07 DIAGNOSIS — E538 Deficiency of other specified B group vitamins: Secondary | ICD-10-CM

## 2015-05-07 DIAGNOSIS — E559 Vitamin D deficiency, unspecified: Secondary | ICD-10-CM

## 2015-05-07 DIAGNOSIS — R7303 Prediabetes: Secondary | ICD-10-CM

## 2015-05-07 NOTE — Patient Instructions (Signed)
Annual wellness in 45 mon4.5 month, call if you need me before   Fasting cbc, lipid, iron, ferritin, B12, vit D HBA1C, cmp and EGFR in 4.5 month  No changes in meds

## 2015-05-08 ENCOUNTER — Other Ambulatory Visit: Payer: Self-pay

## 2015-05-08 MED ORDER — AMLODIPINE BESYLATE 10 MG PO TABS: 10.0000 mg | ORAL_TABLET | Freq: Every day | ORAL | Status: DC

## 2015-05-08 MED ORDER — LOSARTAN POTASSIUM 100 MG PO TABS: 100.0000 mg | ORAL_TABLET | Freq: Every day | ORAL | Status: DC

## 2015-05-08 MED ORDER — HYDRALAZINE HCL 10 MG PO TABS: 10.0000 mg | ORAL_TABLET | Freq: Every day | ORAL | Status: DC

## 2015-05-15 ENCOUNTER — Encounter (HOSPITAL_COMMUNITY): Payer: Medicare Other | Attending: Hematology and Oncology

## 2015-05-15 VITALS — BP 142/59 | HR 79 | Temp 98.5°F | Resp 16

## 2015-05-15 DIAGNOSIS — Z452 Encounter for adjustment and management of vascular access device: Secondary | ICD-10-CM | POA: Diagnosis not present

## 2015-05-15 DIAGNOSIS — Z853 Personal history of malignant neoplasm of breast: Secondary | ICD-10-CM

## 2015-05-15 DIAGNOSIS — Z23 Encounter for immunization: Secondary | ICD-10-CM | POA: Insufficient documentation

## 2015-05-15 DIAGNOSIS — Z9889 Other specified postprocedural states: Secondary | ICD-10-CM | POA: Insufficient documentation

## 2015-05-15 DIAGNOSIS — Z95828 Presence of other vascular implants and grafts: Secondary | ICD-10-CM

## 2015-05-15 MED ORDER — SODIUM CHLORIDE 0.9 % IJ SOLN
10.0000 mL | Freq: Once | INTRAMUSCULAR | Status: AC
  Administered 2015-05-15: 10 mL via INTRAVENOUS

## 2015-05-15 MED ORDER — HEPARIN SOD (PORK) LOCK FLUSH 100 UNIT/ML IV SOLN
500.0000 [IU] | Freq: Once | INTRAVENOUS | Status: AC
  Administered 2015-05-15: 500 [IU] via INTRAVENOUS
  Filled 2015-05-15: qty 5

## 2015-05-15 NOTE — Progress Notes (Signed)
Emma Dawson presented for Portacath access and flush. Proper placement of portacath confirmed by CXR. Portacath located left chest wall accessed with  H 20 needle. Good blood return present. Portacath flushed with 20ml NS and 500U/14ml Heparin and needle removed intact. Procedure without incident. Patient tolerated procedure well.

## 2015-05-15 NOTE — Patient Instructions (Signed)
[place] at [facility] Discharge Instructions  RECOMMENDATIONS MADE BY THE CONSULTANT AND ANY TEST RESULTS WILL BE SENT TO YOUR REFERRING PHYSICIAN.  Port flush done today. Return as scheduled in 6 weeks for port flush.  Thank you for choosing [place] at [facility] to provide your oncology and hematology care.  To afford each patient quality time with our provider, please arrive at least 15 minutes before your scheduled appointment time.    You need to re-schedule your appointment should you arrive 10 or more minutes late.  We strive to give you quality time with our providers, and arriving late affects you and other patients whose appointments are after yours.  Also, if you no show three or more times for appointments you may be dismissed from the clinic at the providers discretion.     Again, thank you for choosing [facility].  Our hope is that these requests will decrease the amount of time that you wait before being seen by our physicians.       _____________________________________________________________  Should you have questions after your visit to [facility], please contact our office at [phone] between the hours of 8:30 a.m. and 4:30 p.m.  Voicemails left after 4:30 p.m. will not be returned until the following business day.  For prescription refill requests, have your pharmacy contact our office.

## 2015-05-25 NOTE — Assessment & Plan Note (Signed)
No acute flare, OTC meds as needed only

## 2015-05-25 NOTE — Assessment & Plan Note (Signed)
Uncontrolled DASH diet and commitment to daily physical activity for a minimum of 30 minutes discussed and encouraged, as a part of hypertension management. The importance of attaining a healthy weight is also discussed.  BP/Weight 05/15/2015 05/07/2015 04/03/2015 02/24/2015 12/02/2014 11/24/2014 90/10/2240  Systolic BP 146 431 427 670 110 034 961  Diastolic BP 59 62 57 62 55 68 79  Wt. (Lbs) - 154.12 155.5 - - 158 -  BMI - 28.18 28.43 - - 28.89 -      Nurse BP re eval in 4 to 6 weeks

## 2015-05-25 NOTE — Assessment & Plan Note (Signed)
Denies any recent flare of infection

## 2015-05-25 NOTE — Assessment & Plan Note (Signed)
Intermittent right knee pain, no falls , safety discussed

## 2015-05-25 NOTE — Progress Notes (Signed)
   Emma Dawson     MRN: [id number]      DOB: 11-14-1932   HPI [name] is here for follow up and re-evaluation of chronic medical conditions, medication management and review of any available recent lab and radiology data.  Preventive health is updated, specifically  Cancer screening and Immunization.   Questions or concerns regarding consultations or procedures which the PT has had in the interim are  addressed. The PT denies any adverse reactions to current medications since the last visit.  There are no new concerns.  There are no specific complaints   ROS Denies recent fever or chills. Denies sinus pressure, nasal congestion, ear pain or sore throat. Denies chest congestion, productive cough or wheezing. Denies chest pains, palpitations and leg swelling Denies abdominal pain, nausea, vomiting,diarrhea or constipation.   Denies dysuria, frequency, hesitancy or incontinence. Denies uncontrolled  joint pain, swelling and limitation in mobility. Denies headaches, seizures, numbness, or tingling. Denies depression, anxiety or insomnia. Denies skin break down or rash.   PE  BP 150/62 mmHg  Pulse 85  Resp 16  Ht 5\' 2"  (1.575 m)  Wt 154 lb 1.9 oz (69.908 kg)  BMI 28.18 kg/m2  SpO2 99%  Patient alert and oriented and in no cardiopulmonary distress.  HEENT: No facial asymmetry, EOMI,   oropharynx pink and moist.  Neck supple no JVD, no mass.  Chest: Clear to auscultation bilaterally.  CVS: S1, S2 no murmurs, no S3.Regular rate.  ABD: Soft non tender.   Ext: No edema  MS: Adequate ROM spine, shoulders, hips and reduced in  knee.  Skin: Intact, no ulcerations or rash noted.  Psych: Good eye contact, normal affect. Memory intact not anxious or depressed appearing.  CNS: CN 2-12 intact, power,  normal throughout.no focal deficits noted.   Assessment & Plan   Essential hypertension Uncontrolled DASH diet and commitment to daily physical activity for a minimum of 30  minutes discussed and encouraged, as a part of hypertension management. The importance of attaining a healthy weight is also discussed.  BP/Weight 05/15/2015 05/07/2015 04/03/2015 02/24/2015 12/02/2014 11/24/2014 62/37/6283  Systolic BP 151 761 607 371 062 694 854  Diastolic BP 59 62 57 62 55 68 79  Wt. (Lbs) - 154.12 155.5 - - 158 -  BMI - 28.18 28.43 - - 28.89 -      Nurse BP re eval in 4 to 6 weeks  CAROTID ARTERY STENOSIS, LEFT Asymptomatic currently, medical management at this time  Allergic rhinitis No acute flare, OTC meds as needed only  Osteoarthritis Intermittent right knee pain, no falls , safety discussed  Personal history of noncompliance with medical treatment, presenting hazards to health Continues to refuse necessary and indicated screening tests and immunizations  Invasive ductal carcinoma of breast No recurrence, does still follow up with oncology  Hyperlipidemia LDL goal <100 Hyperlipidemia:Low fat diet discussed and encouraged.   Lipid Panel  Lab Results  Component Value Date   CHOL 213* 12/02/2014   HDL 54 12/02/2014   LDLCALC 125* 12/02/2014   TRIG 171* 12/02/2014   CHOLHDL 3.9 12/02/2014   Uncontrolled, not at goal Updated lab needed at/ before next visit.

## 2015-05-25 NOTE — Assessment & Plan Note (Signed)
Asymptomatic currently, medical management at this time

## 2015-05-25 NOTE — Assessment & Plan Note (Signed)
Hyperlipidemia:Low fat diet discussed and encouraged.   Lipid Panel  Lab Results  Component Value Date   CHOL 213* 12/02/2014   HDL 54 12/02/2014   LDLCALC 125* 12/02/2014   TRIG 171* 12/02/2014   CHOLHDL 3.9 12/02/2014   Uncontrolled, not at goal Updated lab needed at/ before next visit.

## 2015-05-25 NOTE — Assessment & Plan Note (Signed)
No recurrence, does still follow up with oncology

## 2015-05-25 NOTE — Assessment & Plan Note (Signed)
Continues to refuse necessary and indicated screening tests and immunizations

## 2015-06-02 ENCOUNTER — Encounter: Payer: Self-pay | Admitting: Family Medicine

## 2015-06-02 ENCOUNTER — Other Ambulatory Visit: Payer: Self-pay | Admitting: Family Medicine

## 2015-06-02 ENCOUNTER — Ambulatory Visit (INDEPENDENT_AMBULATORY_CARE_PROVIDER_SITE_OTHER): Payer: Medicare Other | Admitting: Family Medicine

## 2015-06-02 VITALS — BP 146/74 | HR 97 | Resp 16 | Ht 62.0 in | Wt 161.0 lb

## 2015-06-02 DIAGNOSIS — R413 Other amnesia: Secondary | ICD-10-CM | POA: Insufficient documentation

## 2015-06-02 DIAGNOSIS — I10 Essential (primary) hypertension: Secondary | ICD-10-CM

## 2015-06-02 DIAGNOSIS — D649 Anemia, unspecified: Secondary | ICD-10-CM | POA: Diagnosis not present

## 2015-06-02 DIAGNOSIS — Z9119 Patient's noncompliance with other medical treatment and regimen: Secondary | ICD-10-CM

## 2015-06-02 DIAGNOSIS — D539 Nutritional anemia, unspecified: Secondary | ICD-10-CM | POA: Diagnosis not present

## 2015-06-02 DIAGNOSIS — R296 Repeated falls: Secondary | ICD-10-CM

## 2015-06-02 DIAGNOSIS — N3001 Acute cystitis with hematuria: Secondary | ICD-10-CM | POA: Diagnosis not present

## 2015-06-02 DIAGNOSIS — R7301 Impaired fasting glucose: Secondary | ICD-10-CM | POA: Diagnosis not present

## 2015-06-02 DIAGNOSIS — E559 Vitamin D deficiency, unspecified: Secondary | ICD-10-CM | POA: Diagnosis not present

## 2015-06-02 DIAGNOSIS — E785 Hyperlipidemia, unspecified: Secondary | ICD-10-CM | POA: Diagnosis not present

## 2015-06-02 DIAGNOSIS — N3000 Acute cystitis without hematuria: Secondary | ICD-10-CM | POA: Diagnosis not present

## 2015-06-02 DIAGNOSIS — Z91199 Patient's noncompliance with other medical treatment and regimen due to unspecified reason: Secondary | ICD-10-CM

## 2015-06-02 DIAGNOSIS — Z23 Encounter for immunization: Secondary | ICD-10-CM

## 2015-06-02 LAB — POCT URINALYSIS DIPSTICK
BILIRUBIN UA: NEGATIVE
GLUCOSE UA: NEGATIVE
KETONES UA: NEGATIVE
Nitrite, UA: NEGATIVE
Spec Grav, UA: 1.01
Urobilinogen, UA: 0.2
pH, UA: 5.5

## 2015-06-02 MED ORDER — DONEPEZIL HCL 5 MG PO TABS: 5.0000 mg | ORAL_TABLET | Freq: Every day | ORAL | Status: DC

## 2015-06-02 NOTE — Patient Instructions (Addendum)
F/u in  8 weeks call if you need me before  TdAP today  New to help memory is aricept one daily   Fasting labs tomorrow please   You are referred for head scan due to multiple falls and memory loss you will be contacteted with appt info  Urine is being tested , you will be treated if needed  PLS get a life alert   PLS no more falls!

## 2015-06-02 NOTE — Progress Notes (Signed)
   Subjective:    Patient ID: Emma Dawson, female    DOB: 03/09/1932, 81 y.o.   MRN: [id number]  HPI Approx 4 fall in past 6 months, most recent  4 days ago, has bruising to face and abrasion also. Denies any recent fever or chills. Niece who accompanies her for the first time is concerned about increased forgetfullness in her 35. Pt herself is also concerned   Review of Systems See HPI Denies recent fever or chills. Denies sinus pressure, nasal congestion, ear pain or sore throat. Denies chest congestion, productive cough or wheezing. Denies chest pains, palpitations and leg swelling Denies abdominal pain, nausea, vomiting,diarrhea or constipation.   Denies dysuria,  Does note mild frequency, and increased wetting episodes in past 2 weeks Denies disabling   joint pain, swelling and limitation in mobility.Keeps active in her garden and has a vegetable rich diet Denies headaches, seizures, numbness, or tingling. Denies depression, anxiety or insomnia. .        Objective:   Physical Exam BP 146/74 mmHg  Pulse 97  Resp 16  Ht 5\' 2"  (1.575 m)  Wt 161 lb (73.029 kg)  BMI 29.44 kg/m2  SpO2 99%  Patient alert and oriented and in no cardiopulmonary distress.  HEENT: No facial asymmetry, EOMI,   oropharynx pink and moist.  Neck  Decreased ROM, no JVD, no mass.  Chest: Clear to auscultation bilaterally.  CVS: S1, S2 no murmurs, no S3.Regular rate.  ABD: Soft non tender.   Ext: No edema  MS: Adequate though reduced  ROM spine, shoulders, hips and knees.  Skin: Abrasion to right supraorbital area where pt had recent trauma.  Psych: Good eye contact, normal affect. Memory impaied, not anxious or depressed appearing.  CNS: CN 2-12 intact, power,  normal throughout.no focal deficits noted.       Assessment & Plan:  Memory loss Formally diagnosed with memory impairment, niece who is concerned present at visit, aricept started at 5 mg will re eval in 8 weeks Potential  adverse s/e reviewed  Essential hypertension UnControlled, no change in medication, h/o recurrent falls DASH diet and commitment to daily physical activity for a minimum of 30 minutes discussed and encouraged, as a part of hypertension management. The importance of attaining a healthy weight is also discussed.  BP/Weight 08/05/2015 06/26/2015 06/02/2015 05/15/2015 05/07/2015 6/62/9476 03/24/6502  Systolic BP 546 568 127 517 001 749 449  Diastolic BP 62 51 74 59 62 57 62  Wt. (Lbs) 153.04 - 161 - 154.12 155.5 -  BMI 27.98 - 29.44 - 28.18 28.43 -        Recurrent falls Home safety reviewed, PT recommended , however refused  Personal history of noncompliance with medical treatment, presenting hazards to health Ongoing challenge refuses immunization and cancer screening with the exception of mammograms  Need for Tdap vaccination Laceration  And bruising on face where she had recent trauma this past weekend. No purulent drainage. After obtaining informed consent, the vaccine is  administered by LPN.   Acute cystitis with hematuria Abnormal uA with no fevr and minmal symptoms , will await c/s report

## 2015-06-03 DIAGNOSIS — R7301 Impaired fasting glucose: Secondary | ICD-10-CM | POA: Diagnosis not present

## 2015-06-03 DIAGNOSIS — Z23 Encounter for immunization: Secondary | ICD-10-CM | POA: Diagnosis not present

## 2015-06-03 DIAGNOSIS — E559 Vitamin D deficiency, unspecified: Secondary | ICD-10-CM | POA: Diagnosis not present

## 2015-06-03 DIAGNOSIS — D649 Anemia, unspecified: Secondary | ICD-10-CM | POA: Diagnosis not present

## 2015-06-03 DIAGNOSIS — E785 Hyperlipidemia, unspecified: Secondary | ICD-10-CM | POA: Diagnosis not present

## 2015-06-03 LAB — CBC WITH DIFFERENTIAL/PLATELET
BASOS PCT: 1 % (ref 0–1)
Basophils Absolute: 0.1 10*3/uL (ref 0.0–0.1)
EOS ABS: 0.3 10*3/uL (ref 0.0–0.7)
Eosinophils Relative: 3 % (ref 0–5)
HCT: 29.6 % — ABNORMAL LOW (ref 36.0–46.0)
Hemoglobin: 9.5 g/dL — ABNORMAL LOW (ref 12.0–15.0)
Lymphocytes Relative: 25 % (ref 12–46)
Lymphs Abs: 2.5 10*3/uL (ref 0.7–4.0)
MCH: 25.3 pg — AB (ref 26.0–34.0)
MCHC: 32.1 g/dL (ref 30.0–36.0)
MCV: 78.7 fL (ref 78.0–100.0)
MPV: 7.7 fL — AB (ref 8.6–12.4)
Monocytes Absolute: 0.7 10*3/uL (ref 0.1–1.0)
Monocytes Relative: 7 % (ref 3–12)
NEUTROS PCT: 64 % (ref 43–77)
Neutro Abs: 6.3 10*3/uL (ref 1.7–7.7)
PLATELETS: 421 10*3/uL — AB (ref 150–400)
RBC: 3.76 MIL/uL — AB (ref 3.87–5.11)
RDW: 14.9 % (ref 11.5–15.5)
WBC: 9.8 10*3/uL (ref 4.0–10.5)

## 2015-06-03 LAB — LIPID PANEL
Cholesterol: 170 mg/dL (ref 0–200)
HDL: 47 mg/dL (ref >=46)
LDL CALC: 103 mg/dL — AB (ref 0–99)
Total CHOL/HDL Ratio: 3.6 Ratio
Triglycerides: 99 mg/dL (ref <150)
VLDL: 20 mg/dL (ref 0–40)

## 2015-06-03 LAB — COMPREHENSIVE METABOLIC PANEL
ALT: 36 U/L — ABNORMAL HIGH (ref 0–35)
AST: 29 U/L (ref 0–37)
Albumin: 3.6 g/dL (ref 3.5–5.2)
Alkaline Phosphatase: 54 U/L (ref 39–117)
BILIRUBIN TOTAL: 0.5 mg/dL (ref 0.2–1.2)
BUN: 15 mg/dL (ref 6–23)
CALCIUM: 9.3 mg/dL (ref 8.4–10.5)
CO2: 25 mEq/L (ref 19–32)
Chloride: 103 mEq/L (ref 96–112)
Creat: 1.06 mg/dL (ref 0.50–1.10)
Glucose, Bld: 83 mg/dL (ref 70–99)
Potassium: 4.7 mEq/L (ref 3.5–5.3)
Sodium: 139 mEq/L (ref 135–145)
TOTAL PROTEIN: 7.5 g/dL (ref 6.0–8.3)

## 2015-06-03 LAB — FERRITIN: FERRITIN: 261 ng/mL (ref 10–291)

## 2015-06-03 LAB — TSH: TSH: 1.091 u[IU]/mL (ref 0.350–4.500)

## 2015-06-03 LAB — HEMOGLOBIN A1C
HEMOGLOBIN A1C: 6 % — AB (ref <5.7)
Mean Plasma Glucose: 126 mg/dL — ABNORMAL HIGH (ref <117)

## 2015-06-03 LAB — IRON: Iron: 76 ug/dL (ref 42–145)

## 2015-06-04 LAB — VITAMIN B12: Vitamin B-12: 698 pg/mL (ref 211–911)

## 2015-06-04 LAB — VITAMIN D 25 HYDROXY (VIT D DEFICIENCY, FRACTURES): VIT D 25 HYDROXY: 32 ng/mL (ref 30–100)

## 2015-06-05 LAB — URINE CULTURE
COLONY COUNT: NO GROWTH
Organism ID, Bacteria: NO GROWTH

## 2015-06-26 ENCOUNTER — Encounter (HOSPITAL_COMMUNITY): Payer: Medicare Other | Attending: Hematology and Oncology

## 2015-06-26 VITALS — BP 146/51 | HR 85 | Temp 98.3°F | Resp 18

## 2015-06-26 DIAGNOSIS — Z853 Personal history of malignant neoplasm of breast: Secondary | ICD-10-CM | POA: Diagnosis not present

## 2015-06-26 DIAGNOSIS — Z452 Encounter for adjustment and management of vascular access device: Secondary | ICD-10-CM | POA: Diagnosis present

## 2015-06-26 DIAGNOSIS — Z23 Encounter for immunization: Secondary | ICD-10-CM | POA: Insufficient documentation

## 2015-06-26 DIAGNOSIS — Z9889 Other specified postprocedural states: Secondary | ICD-10-CM | POA: Insufficient documentation

## 2015-06-26 DIAGNOSIS — Z95828 Presence of other vascular implants and grafts: Secondary | ICD-10-CM

## 2015-06-26 MED ORDER — HEPARIN SOD (PORK) LOCK FLUSH 100 UNIT/ML IV SOLN
INTRAVENOUS | Status: AC
  Filled 2015-06-26: qty 5

## 2015-06-26 MED ORDER — SODIUM CHLORIDE 0.9 % IJ SOLN
10.0000 mL | Freq: Once | INTRAMUSCULAR | Status: AC
  Administered 2015-06-26: 10 mL via INTRAVENOUS

## 2015-06-26 MED ORDER — HEPARIN SOD (PORK) LOCK FLUSH 100 UNIT/ML IV SOLN
500.0000 [IU] | Freq: Once | INTRAVENOUS | Status: AC
  Administered 2015-06-26: 500 [IU] via INTRAVENOUS

## 2015-06-26 NOTE — Progress Notes (Signed)
Emma Dawson presented for Portacath access and flush. Proper placement of portacath confirmed by CXR. Portacath located left chest wall accessed with  H 20 needle. Good blood return present. Portacath flushed with 70ml NS and 500U/23ml Heparin and needle removed intact. Procedure without incident. Patient tolerated procedure well.

## 2015-06-26 NOTE — Patient Instructions (Signed)
[facility] at [facility] Discharge Instructions  RECOMMENDATIONS MADE BY THE CONSULTANT AND ANY TEST RESULTS WILL BE SENT TO YOUR REFERRING PHYSICIAN.  Port flush today as ordered. Return as scheduled.  Thank you for choosing [facility] at [facility] to provide your oncology and hematology care.  To afford each patient quality time with our provider, please arrive at least 15 minutes before your scheduled appointment time.    You need to re-schedule your appointment should you arrive 10 or more minutes late.  We strive to give you quality time with our providers, and arriving late affects you and other patients whose appointments are after yours.  Also, if you no show three or more times for appointments you may be dismissed from the clinic at the providers discretion.     Again, thank you for choosing [facility].  Our hope is that these requests will decrease the amount of time that you wait before being seen by our physicians.       _____________________________________________________________  Should you have questions after your visit to [facility], please contact our office at [phone] between the hours of 8:30 a.m. and 4:30 p.m.  Voicemails left after 4:30 p.m. will not be returned until the following business day.  For prescription refill requests, have your pharmacy contact our office.    

## 2015-07-07 ENCOUNTER — Other Ambulatory Visit: Payer: Self-pay | Admitting: Family Medicine

## 2015-07-28 ENCOUNTER — Ambulatory Visit: Payer: Medicare Other | Admitting: Family Medicine

## 2015-08-05 ENCOUNTER — Encounter: Payer: Self-pay | Admitting: Family Medicine

## 2015-08-05 ENCOUNTER — Ambulatory Visit (INDEPENDENT_AMBULATORY_CARE_PROVIDER_SITE_OTHER): Payer: Medicare Other | Admitting: Family Medicine

## 2015-08-05 VITALS — BP 140/62 | HR 80 | Resp 18 | Ht 62.0 in | Wt 153.0 lb

## 2015-08-05 DIAGNOSIS — R413 Other amnesia: Secondary | ICD-10-CM | POA: Diagnosis not present

## 2015-08-05 DIAGNOSIS — Z23 Encounter for immunization: Secondary | ICD-10-CM | POA: Diagnosis not present

## 2015-08-05 DIAGNOSIS — E785 Hyperlipidemia, unspecified: Secondary | ICD-10-CM

## 2015-08-05 DIAGNOSIS — R7302 Impaired glucose tolerance (oral): Secondary | ICD-10-CM

## 2015-08-05 DIAGNOSIS — I10 Essential (primary) hypertension: Secondary | ICD-10-CM

## 2015-08-05 MED ORDER — DONEPEZIL HCL 10 MG PO TABS: 10.0000 mg | ORAL_TABLET | Freq: Every day | ORAL | Status: DC

## 2015-08-05 NOTE — Patient Instructions (Addendum)
Annual wellness  Mid January, with MMSE, call if you need me before  Increase in dose of memory pill up to 10 mg, please take TWO 78m tabs every pm till done , then NEW one is the 10 mg ONE at night  Call if problems  I will lower dose again  Flu vac today  Fasting lipid, cmp and EGFr, hBA1C, in January   ALL the best  Thanks for choosing [facility], we consider it a privelige to serve you.

## 2015-08-05 NOTE — Progress Notes (Signed)
   Subjective:    Patient ID: Emma Dawson, female    DOB: [birth year], 81 y.o.   MRN: [id number]  HPI The PT is here for follow up and re-evaluation of chronic medical conditions, medication management and review of any available recent lab and radiology data.  Preventive health is updated, specifically  Cancer screening and Immunization.    The PT denies any adverse reactions to current medications since the last visit. Tolerating the aricept , and states , if it will help her she will take it. Does report some mild GI disturbance There are no new concerns.  There are no specific complaints  No report of falls since last visit      Review of Systems See HPI Denies recent fever or chills. Denies sinus pressure, nasal congestion, ear pain or sore throat. Denies chest congestion, productive cough or wheezing. Denies chest pains, palpitations and leg swelling Denies abdominal pain, nausea, vomiting,diarrhea or constipation.   Denies dysuria, frequency, hesitancy or incontinence. Denies uncontrolled  joint pain, swelling and limitation in mobility. Denies headaches, seizures, numbness, or tingling. Denies depression, anxiety or insomnia. Denies skin break down or rash.        Objective:   Physical Exam BP 140/62 mmHg  Pulse 80  Resp 18  Ht 5\' 2"  (1.575 m)  Wt 153 lb 0.6 oz (69.418 kg)  BMI 27.98 kg/m2  SpO2 96% Patient alert and oriented and in no cardiopulmonary distress.  HEENT: No facial asymmetry, EOMI,   oropharynx pink and moist.  Neck supple no JVD, no mass.  Chest: Clear to auscultation bilaterally.  CVS: S1, S2 no murmurs, no S3.Regular rate.  ABD: Soft non tender.   Ext: No edema  MS: Adequate though reduced  ROM spine, shoulders, hips and knees.  Skin: Intact, no ulcerations or rash noted.  Psych: Good eye contact, normal affect. Memory impaired not anxious or depressed appearing.  CNS: CN 2-12 intact, power,  normal throughout.no focal deficits  noted.        Assessment & Plan:  Memory loss Tolerating 5 mg dose of medication , increased to std treating dose of 10 mg, will check MMSE at f/u  Need for prophylactic vaccination and inoculation against influenza After obtaining informed consent, the vaccine is  administered by LPN.   Hyperlipidemia LDL goal <100 Hyperlipidemia:Low fat diet discussed and encouraged.   Lipid Panel  Lab Results  Component Value Date   CHOL 170 06/02/2015   HDL 47 06/02/2015   LDLCALC 103* 06/02/2015   TRIG 99 06/02/2015   CHOLHDL 3.6 06/02/2015   Needs tpo reduce fried and fatty food, LDL elevated

## 2015-08-07 ENCOUNTER — Encounter (HOSPITAL_COMMUNITY): Payer: Medicare Other

## 2015-08-10 DIAGNOSIS — R296 Repeated falls: Secondary | ICD-10-CM | POA: Insufficient documentation

## 2015-08-10 DIAGNOSIS — Z23 Encounter for immunization: Secondary | ICD-10-CM | POA: Insufficient documentation

## 2015-08-10 DIAGNOSIS — N3001 Acute cystitis with hematuria: Secondary | ICD-10-CM | POA: Insufficient documentation

## 2015-08-10 NOTE — Assessment & Plan Note (Signed)
Formally diagnosed with memory impairment, niece who is concerned present at visit, aricept started at 5 mg will re eval in 8 weeks Potential adverse s/e reviewed

## 2015-08-10 NOTE — Assessment & Plan Note (Signed)
DASH diet and commitment to daily physical activity for a minimum of 30 minutes discussed and encouraged, as a part of hypertension management. The importance of attaining a healthy weight is also discussed.  BP/Weight 08/05/2015 06/26/2015 06/02/2015 05/15/2015 05/07/2015 8/72/7618 02/26/5926  Systolic BP 639 432 003 794 446 190 122  Diastolic BP 62 51 74 59 62 57 62  Wt. (Lbs) 153.04 - 161 - 154.12 155.5 -  BMI 27.98 - 29.44 - 28.18 28.43 -    o med change

## 2015-08-10 NOTE — Assessment & Plan Note (Signed)
After obtaining informed consent, the vaccine is  administered by LPN.  

## 2015-08-10 NOTE — Assessment & Plan Note (Signed)
Home safety reviewed, PT recommended , however refused

## 2015-08-10 NOTE — Assessment & Plan Note (Signed)
Tolerating 5 mg dose of medication , increased to std treating dose of 10 mg, will check MMSE at f/u

## 2015-08-10 NOTE — Assessment & Plan Note (Signed)
Hyperlipidemia:Low fat diet discussed and encouraged.   Lipid Panel  Lab Results  Component Value Date   CHOL 170 06/02/2015   HDL 47 06/02/2015   LDLCALC 103* 06/02/2015   TRIG 99 06/02/2015   CHOLHDL 3.6 06/02/2015   Needs tpo reduce fried and fatty food, LDL elevated

## 2015-08-10 NOTE — Assessment & Plan Note (Signed)
Ongoing challenge refuses immunization and cancer screening with the exception of mammograms

## 2015-08-10 NOTE — Assessment & Plan Note (Signed)
UnControlled, no change in medication, h/o recurrent falls DASH diet and commitment to daily physical activity for a minimum of 30 minutes discussed and encouraged, as a part of hypertension management. The importance of attaining a healthy weight is also discussed.  BP/Weight 08/05/2015 06/26/2015 06/02/2015 05/15/2015 05/07/2015 8/97/8478 02/20/2819  Systolic BP 813 887 195 974 718 550 158  Diastolic BP 62 51 74 59 62 57 62  Wt. (Lbs) 153.04 - 161 - 154.12 155.5 -  BMI 27.98 - 29.44 - 28.18 28.43 -

## 2015-08-10 NOTE — Assessment & Plan Note (Signed)
Abnormal uA with no fevr and minmal symptoms , will await c/s report

## 2015-08-10 NOTE — Assessment & Plan Note (Signed)
Laceration  And bruising on face where she had recent trauma this past weekend. No purulent drainage. After obtaining informed consent, the vaccine is  administered by LPN.

## 2015-08-12 ENCOUNTER — Ambulatory Visit (HOSPITAL_COMMUNITY)
Admission: RE | Admit: 2015-08-12 | Discharge: 2015-08-12 | Disposition: A | Discharge status: Home / Self Care | Payer: Medicare Other | Source: Ambulatory Visit | Attending: Family Medicine | Admitting: Family Medicine

## 2015-08-12 DIAGNOSIS — J323 Chronic sphenoidal sinusitis: Secondary | ICD-10-CM | POA: Diagnosis not present

## 2015-08-12 DIAGNOSIS — R413 Other amnesia: Secondary | ICD-10-CM | POA: Diagnosis not present

## 2015-08-12 DIAGNOSIS — G319 Degenerative disease of nervous system, unspecified: Secondary | ICD-10-CM | POA: Insufficient documentation

## 2015-08-12 DIAGNOSIS — R296 Repeated falls: Secondary | ICD-10-CM | POA: Diagnosis not present

## 2015-08-13 ENCOUNTER — Telehealth: Payer: Self-pay

## 2015-08-13 MED ORDER — LEVOFLOXACIN 500 MG PO TABS: 500.0000 mg | ORAL_TABLET | Freq: Every day | ORAL | Status: DC

## 2015-08-13 NOTE — Telephone Encounter (Signed)
Patient aware of results from CT and will collect rx for antibiotic.

## 2015-08-13 NOTE — Telephone Encounter (Signed)
-----   Message from Fayrene Helper, MD sent at 08/12/2015  9:38 PM EDT ----- Pls let her know that brain scan just shows changes of aging, no cancer or bleed. She does have infection in one of her sinuses. Pls send in levaquin 5000 mg one daily for 5 days total t o treat this and let her know needs to take it

## 2015-08-14 ENCOUNTER — Encounter (HOSPITAL_COMMUNITY): Payer: Medicare Other | Attending: Hematology and Oncology

## 2015-08-14 VITALS — BP 135/68 | HR 71 | Temp 98.1°F | Resp 18

## 2015-08-14 DIAGNOSIS — Z95828 Presence of other vascular implants and grafts: Secondary | ICD-10-CM

## 2015-08-14 DIAGNOSIS — Z9889 Other specified postprocedural states: Secondary | ICD-10-CM | POA: Insufficient documentation

## 2015-08-14 DIAGNOSIS — C50911 Malignant neoplasm of unspecified site of right female breast: Secondary | ICD-10-CM | POA: Diagnosis not present

## 2015-08-14 DIAGNOSIS — Z23 Encounter for immunization: Secondary | ICD-10-CM | POA: Insufficient documentation

## 2015-08-14 DIAGNOSIS — Z452 Encounter for adjustment and management of vascular access device: Secondary | ICD-10-CM | POA: Diagnosis present

## 2015-08-14 MED ORDER — HEPARIN SOD (PORK) LOCK FLUSH 100 UNIT/ML IV SOLN
500.0000 [IU] | Freq: Once | INTRAVENOUS | Status: AC
  Administered 2015-08-14: 500 [IU] via INTRAVENOUS

## 2015-08-14 MED ORDER — HEPARIN SOD (PORK) LOCK FLUSH 100 UNIT/ML IV SOLN
INTRAVENOUS | Status: AC
Start: 2015-08-14 — End: 2015-08-14
  Filled 2015-08-14: qty 5

## 2015-08-14 MED ORDER — SODIUM CHLORIDE 0.9 % IJ SOLN
10.0000 mL | Freq: Once | INTRAMUSCULAR | Status: AC
  Administered 2015-08-14: 10 mL via INTRAVENOUS

## 2015-08-14 NOTE — Progress Notes (Signed)
Emma Dawson presented for Portacath access and flush. Proper placement of portacath confirmed by CXR. Portacath located left chest wall accessed with  H 20 needle. Good blood return present. Portacath flushed with 20ml NS and 500U/5ml Heparin and needle removed intact. Procedure without incident. Patient tolerated procedure well.   

## 2015-08-14 NOTE — Patient Instructions (Signed)
[facility] at [facility] Discharge Instructions  RECOMMENDATIONS MADE BY THE CONSULTANT AND ANY TEST RESULTS WILL BE SENT TO YOUR REFERRING PHYSICIAN.  Port flush today as ordered. Return as scheduled.  Thank you for choosing [facility] at [facility] to provide your oncology and hematology care.  To afford each patient quality time with our provider, please arrive at least 15 minutes before your scheduled appointment time.    You need to re-schedule your appointment should you arrive 10 or more minutes late.  We strive to give you quality time with our providers, and arriving late affects you and other patients whose appointments are after yours.  Also, if you no show three or more times for appointments you may be dismissed from the clinic at the providers discretion.     Again, thank you for choosing [facility].  Our hope is that these requests will decrease the amount of time that you wait before being seen by our physicians.       _____________________________________________________________  Should you have questions after your visit to [facility], please contact our office at [phone] between the hours of 8:30 a.m. and 4:30 p.m.  Voicemails left after 4:30 p.m. will not be returned until the following business day.  For prescription refill requests, have your pharmacy contact our office.    

## 2015-09-18 ENCOUNTER — Encounter (HOSPITAL_COMMUNITY): Payer: Medicare Other | Attending: Oncology

## 2015-09-18 ENCOUNTER — Encounter (HOSPITAL_COMMUNITY): Payer: Self-pay

## 2015-09-18 VITALS — BP 128/56 | HR 65 | Temp 98.1°F | Resp 20

## 2015-09-18 DIAGNOSIS — Z452 Encounter for adjustment and management of vascular access device: Secondary | ICD-10-CM | POA: Diagnosis present

## 2015-09-18 DIAGNOSIS — Z95828 Presence of other vascular implants and grafts: Secondary | ICD-10-CM

## 2015-09-18 DIAGNOSIS — Z853 Personal history of malignant neoplasm of breast: Secondary | ICD-10-CM | POA: Diagnosis not present

## 2015-09-18 MED ORDER — HEPARIN SOD (PORK) LOCK FLUSH 100 UNIT/ML IV SOLN
INTRAVENOUS | Status: AC
  Filled 2015-09-18: qty 5

## 2015-09-18 MED ORDER — HEPARIN SOD (PORK) LOCK FLUSH 100 UNIT/ML IV SOLN
500.0000 [IU] | Freq: Once | INTRAVENOUS | Status: AC
  Administered 2015-09-18: 500 [IU] via INTRAVENOUS

## 2015-09-18 MED ORDER — SODIUM CHLORIDE 0.9 % IJ SOLN
10.0000 mL | INTRAMUSCULAR | Status: DC | PRN
  Administered 2015-09-18: 10 mL via INTRAVENOUS
  Filled 2015-09-18: qty 10

## 2015-09-18 NOTE — Patient Instructions (Signed)
[place] at [facility] Discharge Instructions  RECOMMENDATIONS MADE BY THE CONSULTANT AND ANY TEST RESULTS WILL BE SENT TO YOUR REFERRING PHYSICIAN.  Port flush done today. Patient tolerated well. Will see you at your next appointment.  Thank you for choosing [place] at [facility] to provide your oncology and hematology care.  To afford each patient quality time with our provider, please arrive at least 15 minutes before your scheduled appointment time.    You need to re-schedule your appointment should you arrive 10 or more minutes late.  We strive to give you quality time with our providers, and arriving late affects you and other patients whose appointments are after yours.  Also, if you no show three or more times for appointments you may be dismissed from the clinic at the providers discretion.     Again, thank you for choosing [facility].  Our hope is that these requests will decrease the amount of time that you wait before being seen by our physicians.       _____________________________________________________________  Should you have questions after your visit to [facility], please contact our office at [phone] between the hours of 8:30 a.m. and 4:30 p.m.  Voicemails left after 4:30 p.m. will not be returned until the following business day.  For prescription refill requests, have your pharmacy contact our office.

## 2015-09-18 NOTE — Progress Notes (Signed)
Emma Dawson presented for Portacath access and flush. Portacath located left chest wall accessed with  H 20 needle. Good blood return present. Portacath flushed with 52ml NS and 500U/36ml Heparin and needle removed intact. Procedure without incident. Patient tolerated procedure well.

## 2015-09-23 ENCOUNTER — Other Ambulatory Visit: Payer: Self-pay | Admitting: Family Medicine

## 2015-10-21 ENCOUNTER — Encounter: Payer: Medicare Other | Admitting: Family Medicine

## 2015-10-23 ENCOUNTER — Other Ambulatory Visit: Payer: Self-pay | Admitting: Family Medicine

## 2015-10-30 ENCOUNTER — Encounter (HOSPITAL_COMMUNITY): Payer: Self-pay

## 2015-10-30 ENCOUNTER — Encounter (HOSPITAL_COMMUNITY): Payer: Medicare Other | Attending: Hematology and Oncology

## 2015-10-30 VITALS — BP 134/57 | HR 67 | Temp 98.3°F | Resp 18

## 2015-10-30 DIAGNOSIS — Z452 Encounter for adjustment and management of vascular access device: Secondary | ICD-10-CM | POA: Diagnosis present

## 2015-10-30 DIAGNOSIS — Z9889 Other specified postprocedural states: Secondary | ICD-10-CM | POA: Insufficient documentation

## 2015-10-30 DIAGNOSIS — Z23 Encounter for immunization: Secondary | ICD-10-CM | POA: Insufficient documentation

## 2015-10-30 DIAGNOSIS — Z95828 Presence of other vascular implants and grafts: Secondary | ICD-10-CM

## 2015-10-30 DIAGNOSIS — C50911 Malignant neoplasm of unspecified site of right female breast: Secondary | ICD-10-CM

## 2015-10-30 MED ORDER — HEPARIN SOD (PORK) LOCK FLUSH 100 UNIT/ML IV SOLN
500.0000 [IU] | Freq: Once | INTRAVENOUS | Status: AC
  Administered 2015-10-30: 500 [IU] via INTRAVENOUS

## 2015-10-30 MED ORDER — SODIUM CHLORIDE 0.9 % IJ SOLN
10.0000 mL | INTRAMUSCULAR | Status: DC | PRN
Start: 2015-10-30 — End: 2015-10-30
  Administered 2015-10-30: 10 mL via INTRAVENOUS
  Filled 2015-10-30: qty 10

## 2015-10-30 MED ORDER — HEPARIN SOD (PORK) LOCK FLUSH 100 UNIT/ML IV SOLN
INTRAVENOUS | Status: AC
  Filled 2015-10-30: qty 5

## 2015-10-30 NOTE — Progress Notes (Signed)
..  Emma Dawson presented for Portacath access and flush.   Portacath located left chest wall accessed with  H 20 needle.  Good blood return present. Portacath flushed with 5ml NS and 500U/84ml Heparin and needle removed intact.  Procedure tolerated well and without incident.

## 2015-12-08 DIAGNOSIS — I10 Essential (primary) hypertension: Secondary | ICD-10-CM | POA: Diagnosis not present

## 2015-12-08 DIAGNOSIS — R7302 Impaired glucose tolerance (oral): Secondary | ICD-10-CM | POA: Diagnosis not present

## 2015-12-08 DIAGNOSIS — E785 Hyperlipidemia, unspecified: Secondary | ICD-10-CM | POA: Diagnosis not present

## 2015-12-09 LAB — COMPLETE METABOLIC PANEL WITH GFR
ALT: 13 U/L (ref 6–29)
AST: 17 U/L (ref 10–35)
Albumin: 4 g/dL (ref 3.6–5.1)
Alkaline Phosphatase: 58 U/L (ref 33–130)
BUN: 16 mg/dL (ref 7–25)
CALCIUM: 9.6 mg/dL (ref 8.6–10.4)
CO2: 29 mmol/L (ref 20–31)
CREATININE: 1.08 mg/dL — AB (ref 0.60–0.88)
Chloride: 102 mmol/L (ref 98–110)
GFR, EST AFRICAN AMERICAN: 56 mL/min — AB (ref >=60)
GFR, Est Non African American: 48 mL/min — ABNORMAL LOW (ref >=60)
Glucose, Bld: 83 mg/dL (ref 65–99)
Potassium: 4.9 mmol/L (ref 3.5–5.3)
Sodium: 137 mmol/L (ref 135–146)
TOTAL PROTEIN: 7.2 g/dL (ref 6.1–8.1)
Total Bilirubin: 0.5 mg/dL (ref 0.2–1.2)

## 2015-12-09 LAB — LIPID PANEL
CHOLESTEROL: 206 mg/dL — AB (ref 125–200)
HDL: 65 mg/dL (ref >=46)
LDL Cholesterol: 116 mg/dL (ref <130)
TRIGLYCERIDES: 124 mg/dL (ref <150)
Total CHOL/HDL Ratio: 3.2 Ratio (ref <=5.0)
VLDL: 25 mg/dL (ref <30)

## 2015-12-09 LAB — HEMOGLOBIN A1C
Hgb A1c MFr Bld: 5.8 % — ABNORMAL HIGH (ref <5.7)
MEAN PLASMA GLUCOSE: 120 mg/dL — AB (ref <117)

## 2015-12-14 ENCOUNTER — Encounter: Payer: Medicare Other | Admitting: Family Medicine

## 2015-12-25 ENCOUNTER — Encounter (HOSPITAL_COMMUNITY): Payer: Medicare Other | Attending: Hematology and Oncology

## 2015-12-25 VITALS — BP 125/56 | HR 70 | Temp 98.2°F | Resp 18

## 2015-12-25 DIAGNOSIS — Z452 Encounter for adjustment and management of vascular access device: Secondary | ICD-10-CM | POA: Diagnosis not present

## 2015-12-25 DIAGNOSIS — Z95828 Presence of other vascular implants and grafts: Secondary | ICD-10-CM

## 2015-12-25 DIAGNOSIS — C50911 Malignant neoplasm of unspecified site of right female breast: Secondary | ICD-10-CM | POA: Diagnosis not present

## 2015-12-25 MED ORDER — HEPARIN SOD (PORK) LOCK FLUSH 100 UNIT/ML IV SOLN
500.0000 [IU] | Freq: Once | INTRAVENOUS | Status: AC
  Administered 2015-12-25: 500 [IU] via INTRAVENOUS
  Filled 2015-12-25: qty 5

## 2015-12-25 MED ORDER — SODIUM CHLORIDE 0.9% FLUSH
10.0000 mL | Freq: Once | INTRAVENOUS | Status: AC
  Administered 2015-12-25: 10 mL via INTRAVENOUS

## 2015-12-25 NOTE — Progress Notes (Signed)
Emma Dawson presented for Portacath access and flush. Proper placement of portacath confirmed by CXR. Portacath located right chest wall accessed with  H 20 needle. Good blood return present. Portacath flushed with 66ml NS and 500U/45ml Heparin and needle removed intact. Procedure without incident. Patient tolerated procedure well.

## 2015-12-25 NOTE — Patient Instructions (Signed)
[place] at [facility] Discharge Instructions  RECOMMENDATIONS MADE BY THE CONSULTANT AND ANY TEST RESULTS WILL BE SENT TO YOUR REFERRING PHYSICIAN.  Port Flush today.    Thank you for choosing [place] at [facility] to provide your oncology and hematology care.  To afford each patient quality time with our provider, please arrive at least 15 minutes before your scheduled appointment time.   Beginning January 23rd 2017 lab work for the [facility] will be done in the  Main lab at [facility] on 1st floor. If you have a lab appointment with the [facility] please come in thru the  Main Entrance and check in at the main information desk  You need to re-schedule your appointment should you arrive 10 or more minutes late.  We strive to give you quality time with our providers, and arriving late affects you and other patients whose appointments are after yours.  Also, if you no show three or more times for appointments you may be dismissed from the clinic at the providers discretion.     Again, thank you for choosing [facility].  Our hope is that these requests will decrease the amount of time that you wait before being seen by our physicians.       _____________________________________________________________  Should you have questions after your visit to [facility], please contact our office at [phone] between the hours of 8:30 a.m. and 4:30 p.m.  Voicemails left after 4:30 p.m. will not be returned until the following business day.  For prescription refill requests, have your pharmacy contact our office.

## 2016-01-20 ENCOUNTER — Other Ambulatory Visit: Payer: Self-pay | Admitting: Family Medicine

## 2016-01-25 ENCOUNTER — Other Ambulatory Visit: Payer: Self-pay | Admitting: Family Medicine

## 2016-02-19 ENCOUNTER — Encounter (HOSPITAL_COMMUNITY): Payer: Medicare Other

## 2016-02-22 ENCOUNTER — Encounter (HOSPITAL_COMMUNITY): Payer: Medicare Other | Attending: Hematology and Oncology

## 2016-02-22 ENCOUNTER — Encounter: Payer: Self-pay | Admitting: Family Medicine

## 2016-02-22 ENCOUNTER — Ambulatory Visit (INDEPENDENT_AMBULATORY_CARE_PROVIDER_SITE_OTHER): Payer: Medicare Other | Admitting: Family Medicine

## 2016-02-22 VITALS — BP 134/60 | HR 85 | Resp 16 | Ht 62.0 in | Wt 150.0 lb

## 2016-02-22 VITALS — BP 148/56 | HR 75 | Temp 98.0°F | Resp 18

## 2016-02-22 DIAGNOSIS — C50911 Malignant neoplasm of unspecified site of right female breast: Secondary | ICD-10-CM | POA: Diagnosis not present

## 2016-02-22 DIAGNOSIS — Z452 Encounter for adjustment and management of vascular access device: Secondary | ICD-10-CM | POA: Diagnosis not present

## 2016-02-22 DIAGNOSIS — E785 Hyperlipidemia, unspecified: Secondary | ICD-10-CM | POA: Diagnosis not present

## 2016-02-22 DIAGNOSIS — E559 Vitamin D deficiency, unspecified: Secondary | ICD-10-CM

## 2016-02-22 DIAGNOSIS — I10 Essential (primary) hypertension: Secondary | ICD-10-CM | POA: Diagnosis not present

## 2016-02-22 DIAGNOSIS — Z0000 Encounter for general adult medical examination without abnormal findings: Secondary | ICD-10-CM | POA: Diagnosis not present

## 2016-02-22 DIAGNOSIS — R7301 Impaired fasting glucose: Secondary | ICD-10-CM

## 2016-02-22 DIAGNOSIS — C50919 Malignant neoplasm of unspecified site of unspecified female breast: Secondary | ICD-10-CM

## 2016-02-22 DIAGNOSIS — R7303 Prediabetes: Secondary | ICD-10-CM

## 2016-02-22 MED ORDER — SODIUM CHLORIDE 0.9% FLUSH
20.0000 mL | INTRAVENOUS | Status: DC | PRN
  Administered 2016-02-22: 20 mL via INTRAVENOUS
  Filled 2016-02-22: qty 20

## 2016-02-22 MED ORDER — HEPARIN SOD (PORK) LOCK FLUSH 100 UNIT/ML IV SOLN
500.0000 [IU] | Freq: Once | INTRAVENOUS | Status: AC
  Administered 2016-02-22: 500 [IU] via INTRAVENOUS
  Filled 2016-02-22: qty 5

## 2016-02-22 NOTE — Patient Instructions (Signed)
[place] at [facility] Discharge Instructions  RECOMMENDATIONS MADE BY THE CONSULTANT AND ANY TEST RESULTS WILL BE SENT TO YOUR REFERRING PHYSICIAN.  Port flush today.  Return in 8 weeks for next port flush.    Thank you for choosing [place] at [facility] to provide your oncology and hematology care.  To afford each patient quality time with our provider, please arrive at least 15 minutes before your scheduled appointment time.   Beginning January 23rd 2017 lab work for the [facility] will be done in the  Main lab at [facility] on 1st floor. If you have a lab appointment with the [place] please come in thru the  Main Entrance and check in at the main information desk  You need to re-schedule your appointment should you arrive 10 or more minutes late.  We strive to give you quality time with our providers, and arriving late affects you and other patients whose appointments are after yours.  Also, if you no show three or more times for appointments you may be dismissed from the clinic at the providers discretion.     Again, thank you for choosing [facility].  Our hope is that these requests will decrease the amount of time that you wait before being seen by our physicians.       _____________________________________________________________  Should you have questions after your visit to [facility], please contact our office at [phone] between the hours of 8:30 a.m. and 4:30 p.m.  Voicemails left after 4:30 p.m. will not be returned until the following business day.  For prescription refill requests, have your pharmacy contact our office.         Resources For Cancer Patients and their Caregivers ? American Cancer Society: Can assist with transportation, wigs, general needs, runs Look Good Feel Better.        438-486-6297 ? Cancer Care: Provides financial assistance, online support groups,  medication/co-pay assistance.  1-800-813-HOPE (701)718-1074) ? [employer] Assists [place] cancer patients and their families through emotional , educational and financial support.  808-542-6352 ? [place] DSS Where to apply for food stamps, Medicaid and utility assistance. 8437054739 ? [facility]: Transportation to medical appointments. 979 344 5674 ? Social Security Administration: May apply for disability if have a Stage IV cancer. (769)520-8382 (870)354-2100 ? [employer], Disability and Transit Services: Assists with nutrition, care and transit needs. (323)117-4405

## 2016-02-22 NOTE — Patient Instructions (Addendum)
F/u in 5 months, call if you need me before  Keep doing puzzles and gardening, eating vegetables   CBC, fasting lipid, cmop and EGFR, HBa1C, TSH and vit D  Fall Prevention in the Home  Falls can cause injuries. They can happen to people of all ages. There are many things you can do to make your home safe and to help prevent falls.  WHAT CAN I DO ON THE OUTSIDE OF MY HOME?  Regularly fix the edges of walkways and driveways and fix any cracks.  Remove anything that might make you trip as you walk through a door, such as a raised step or threshold.  Trim any bushes or trees on the path to your home.  Use bright outdoor lighting.  Clear any walking paths of anything that might make someone trip, such as rocks or tools.  Regularly check to see if handrails are loose or broken. Make sure that both sides of any steps have handrails.  Any raised decks and porches should have guardrails on the edges.  Have any leaves, snow, or ice cleared regularly.  Use sand or salt on walking paths during winter.  Clean up any spills in your garage right away. This includes oil or grease spills. WHAT CAN I DO IN THE BATHROOM?   Use night lights.  Install grab bars by the toilet and in the tub and shower. Do not use towel bars as grab bars.  Use non-skid mats or decals in the tub or shower.  If you need to sit down in the shower, use a plastic, non-slip stool.  Keep the floor dry. Clean up any water that spills on the floor as soon as it happens.  Remove soap buildup in the tub or shower regularly.  Attach bath mats securely with double-sided non-slip rug tape.  Do not have throw rugs and other things on the floor that can make you trip. WHAT CAN I DO IN THE BEDROOM?  Use night lights.  Make sure that you have a light by your bed that is easy to reach.  Do not use any sheets or blankets that are too big for your bed. They should not hang down onto the floor.  Have a firm chair that has  side arms. You can use this for support while you get dressed.  Do not have throw rugs and other things on the floor that can make you trip. WHAT CAN I DO IN THE KITCHEN?  Clean up any spills right away.  Avoid walking on wet floors.  Keep items that you use a lot in easy-to-reach places.  If you need to reach something above you, use a strong step stool that has a grab bar.  Keep electrical cords out of the way.  Do not use floor polish or wax that makes floors slippery. If you must use wax, use non-skid floor wax.  Do not have throw rugs and other things on the floor that can make you trip. WHAT CAN I DO WITH MY STAIRS?  Do not leave any items on the stairs.  Make sure that there are handrails on both sides of the stairs and use them. Fix handrails that are broken or loose. Make sure that handrails are as long as the stairways.  Check any carpeting to make sure that it is firmly attached to the stairs. Fix any carpet that is loose or worn.  Avoid having throw rugs at the top or bottom of the stairs. If you do  have throw rugs, attach them to the floor with carpet tape.  Make sure that you have a light switch at the top of the stairs and the bottom of the stairs. If you do not have them, ask someone to add them for you. WHAT ELSE CAN I DO TO HELP PREVENT FALLS?  Wear shoes that:  Do not have high heels.  Have rubber bottoms.  Are comfortable and fit you well.  Are closed at the toe. Do not wear sandals.  If you use a stepladder:  Make sure that it is fully opened. Do not climb a closed stepladder.  Make sure that both sides of the stepladder are locked into place.  Ask someone to hold it for you, if possible.  Clearly mark and make sure that you can see:  Any grab bars or handrails.  First and last steps.  Where the edge of each step is.  Use tools that help you move around (mobility aids) if they are needed. These  include:  Canes.  Walkers.  Scooters.  Crutches.  Turn on the lights when you go into a dark area. Replace any light bulbs as soon as they burn out.  Set up your furniture so you have a clear path. Avoid moving your furniture around.  If any of your floors are uneven, fix them.  If there are any pets around you, be aware of where they are.  Review your medicines with your doctor. Some medicines can make you feel dizzy. This can increase your chance of falling. Ask your doctor what other things that you can do to help prevent falls.   This information is not intended to replace advice given to you by your health care provider. Make sure you discuss any questions you have with your health care provider.   Document Released: 09/03/2009 Document Revised: 03/24/2015 Document Reviewed: 12/12/2014 Elsevier Interactive Patient Education 2016 [place] Prevention in the Home  Falls can cause injuries. They can happen to people of all ages. There are many things you can do to make your home safe and to help prevent falls.  WHAT CAN I DO ON THE OUTSIDE OF MY HOME?  Regularly fix the edges of walkways and driveways and fix any cracks.  Remove anything that might make you trip as you walk through a door, such as a raised step or threshold.  Trim any bushes or trees on the path to your home.  Use bright outdoor lighting.  Clear any walking paths of anything that might make someone trip, such as rocks or tools.  Regularly check to see if handrails are loose or broken. Make sure that both sides of any steps have handrails.  Any raised decks and porches should have guardrails on the edges.  Have any leaves, snow, or ice cleared regularly.  Use sand or salt on walking paths during winter.  Clean up any spills in your garage right away. This includes oil or grease spills. WHAT CAN I DO IN THE BATHROOM?   Use night lights.  Install grab bars by the toilet and in the tub and  shower. Do not use towel bars as grab bars.  Use non-skid mats or decals in the tub or shower.  If you need to sit down in the shower, use a plastic, non-slip stool.  Keep the floor dry. Clean up any water that spills on the floor as soon as it happens.  Remove soap buildup in the tub or shower regularly.  Attach bath mats securely with double-sided non-slip rug tape.  Do not have throw rugs and other things on the floor that can make you trip. WHAT CAN I DO IN THE BEDROOM?  Use night lights.  Make sure that you have a light by your bed that is easy to reach.  Do not use any sheets or blankets that are too big for your bed. They should not hang down onto the floor.  Have a firm chair that has side arms. You can use this for support while you get dressed.  Do not have throw rugs and other things on the floor that can make you trip. WHAT CAN I DO IN THE KITCHEN?  Clean up any spills right away.  Avoid walking on wet floors.  Keep items that you use a lot in easy-to-reach places.  If you need to reach something above you, use a strong step stool that has a grab bar.  Keep electrical cords out of the way.  Do not use floor polish or wax that makes floors slippery. If you must use wax, use non-skid floor wax.  Do not have throw rugs and other things on the floor that can make you trip. WHAT CAN I DO WITH MY STAIRS?  Do not leave any items on the stairs.  Make sure that there are handrails on both sides of the stairs and use them. Fix handrails that are broken or loose. Make sure that handrails are as long as the stairways.  Check any carpeting to make sure that it is firmly attached to the stairs. Fix any carpet that is loose or worn.  Avoid having throw rugs at the top or bottom of the stairs. If you do have throw rugs, attach them to the floor with carpet tape.  Make sure that you have a light switch at the top of the stairs and the bottom of the stairs. If you do not  have them, ask someone to add them for you. WHAT ELSE CAN I DO TO HELP PREVENT FALLS?  Wear shoes that:  Do not have high heels.  Have rubber bottoms.  Are comfortable and fit you well.  Are closed at the toe. Do not wear sandals.  If you use a stepladder:  Make sure that it is fully opened. Do not climb a closed stepladder.  Make sure that both sides of the stepladder are locked into place.  Ask someone to hold it for you, if possible.  Clearly mark and make sure that you can see:  Any grab bars or handrails.  First and last steps.  Where the edge of each step is.  Use tools that help you move around (mobility aids) if they are needed. These include:  Canes.  Walkers.  Scooters.  Crutches.  Turn on the lights when you go into a dark area. Replace any light bulbs as soon as they burn out.  Set up your furniture so you have a clear path. Avoid moving your furniture around.  If any of your floors are uneven, fix them.  If there are any pets around you, be aware of where they are.  Review your medicines with your doctor. Some medicines can make you feel dizzy. This can increase your chance of falling. Ask your doctor what other things that you can do to help prevent falls.   This information is not intended to replace advice given to you by your health care provider. Make sure you discuss any questions you  have with your health care provider.   Document Released: 09/03/2009 Document Revised: 03/24/2015 Document Reviewed: 12/12/2014 Elsevier Interactive Patient Education [employer].

## 2016-02-22 NOTE — Progress Notes (Signed)
Subjective:    Patient ID: Emma Dawson, female    DOB: [birth year], 82 y.o.   MRN: [id number]  HPI Preventive Screening-Counseling & Management   Patient present here today for a Medicare annual wellness visit.   Current Problems (verified)   Medications Prior to Visit Allergies (verified)   PAST HISTORY  Family History (verified)   Social History widowed since 2000, no children. Home maker, never smoker    Risk Factors  Current exercise habits:  walks several days weekly   Dietary issues discussed: limits carbs and fried fatty foods    Cardiac risk factors:   Depression Screen  (Note: if answer to either of the following is "Yes", a more complete depression screening is indicated)   Over the past two weeks, have you felt down, depressed or hopeless? No  Over the past two weeks, have you felt little interest or pleasure in doing things? No  Have you lost interest or pleasure in daily life? No  Do you often feel hopeless? No  Do you cry easily over simple problems? No   Activities of Daily Living  In your present state of health, do you have any difficulty performing the following activities?  Driving?: doesn't drive  Managing money?: No Feeding yourself?:No Getting from bed to chair?:No Climbing a flight of stairs?:No Preparing food and eating?:No Bathing or showering?:No Getting dressed?:No Getting to the toilet?:No Using the toilet?:No Moving around from place to place?: No  Fall Risk Assessment In the past year have you fallen or had a near fall?: once- slipped on some water in the floor  Are you currently taking any medications that make you dizzy?:No   Hearing Difficulties: No Do you often ask people to speak up or repeat themselves?:No Do you experience ringing or noises in your ears?:No Do you have difficulty understanding soft or whispered voices?:No  Cognitive Testing  Alert? Yes Normal Appearance?Yes  Oriented to person? Yes Place? Yes    Time? Yes  Displays appropriate judgment?Yes  Can read the correct time from a watch face? yes Are you having problems remembering things?No  Advanced Directives have been discussed with the patient?Yes, already has a brochure    List the Names of Other Physician/Practitioners you currently use:  Dr Whitney Muse, (oncology)   Indicate any recent Medical Services you may have received from other than [facility] providers in the past year (date may be approximate).   Assessment:    Annual Wellness Exam   Plan:     Medicare Attestation  I have personally reviewed:  The patient's medical and social history  Their use of alcohol, tobacco or illicit drugs  Their current medications and supplements  The patient's functional ability including ADLs,fall risks, home safety risks, cognitive, and hearing and visual impairment  Diet and physical activities  Evidence for depression or mood disorders  The patient's weight, height, BMI, and visual acuity have been recorded in the chart. I have made referrals, counseling, and provided education to the patient based on review of the above and I have provided the patient with a written personalized care plan for preventive services.      Review of Systems     Objective:   Physical Exam BP 134/60 mmHg  Pulse 85  Resp 16  Ht 5\' 2"  (1.575 m)  Wt 150 lb (68.04 kg)  BMI 27.43 kg/m2  SpO2 99%        Assessment & Plan:  Medicare annual wellness visit, subsequent Annual exam as documented.  Counseling done  re healthy lifestyle involving commitment to 150 minutes exercise per week, heart healthy diet, and attaining healthy weight.The importance of adequate sleep also discussed. Regular seat belt use and home safety, is also discussed. Changes in health habits are decided on by the patient with goals and time frames  set for achieving them. Immunization and cancer screening needs are specifically addressed at this visit.

## 2016-02-22 NOTE — Assessment & Plan Note (Signed)

## 2016-02-22 NOTE — Progress Notes (Signed)
Emma Dawson presented for Portacath access and flush. Proper placement of portacath confirmed by CXR. Portacath located left chest wall accessed with  H 20 needle. Good blood return present. Portacath flushed with 20ml NS and 500U/5ml Heparin and needle removed intact. Procedure without incident. Patient tolerated procedure well.   

## 2016-02-24 ENCOUNTER — Encounter: Payer: Self-pay | Admitting: Family Medicine

## 2016-04-04 ENCOUNTER — Ambulatory Visit (HOSPITAL_COMMUNITY): Payer: Medicare Other | Admitting: Hematology & Oncology

## 2016-04-04 ENCOUNTER — Encounter (HOSPITAL_COMMUNITY): Payer: Medicare Other

## 2016-04-15 ENCOUNTER — Encounter (HOSPITAL_COMMUNITY): Payer: Medicare Other | Attending: Hematology and Oncology | Admitting: Hematology & Oncology

## 2016-04-15 ENCOUNTER — Encounter (HOSPITAL_COMMUNITY): Payer: Self-pay | Admitting: Hematology & Oncology

## 2016-04-15 ENCOUNTER — Encounter (HOSPITAL_BASED_OUTPATIENT_CLINIC_OR_DEPARTMENT_OTHER): Payer: Medicare Other

## 2016-04-15 VITALS — BP 148/52 | HR 78 | Temp 98.4°F | Resp 20 | Wt 150.6 lb

## 2016-04-15 DIAGNOSIS — D649 Anemia, unspecified: Secondary | ICD-10-CM

## 2016-04-15 DIAGNOSIS — Z853 Personal history of malignant neoplasm of breast: Secondary | ICD-10-CM

## 2016-04-15 DIAGNOSIS — N183 Chronic kidney disease, stage 3 (moderate): Secondary | ICD-10-CM | POA: Diagnosis not present

## 2016-04-15 DIAGNOSIS — Z95828 Presence of other vascular implants and grafts: Secondary | ICD-10-CM

## 2016-04-15 DIAGNOSIS — C50911 Malignant neoplasm of unspecified site of right female breast: Secondary | ICD-10-CM

## 2016-04-15 MED ORDER — HEPARIN SOD (PORK) LOCK FLUSH 100 UNIT/ML IV SOLN
INTRAVENOUS | Status: AC
  Filled 2016-04-15: qty 5

## 2016-04-15 MED ORDER — HEPARIN SOD (PORK) LOCK FLUSH 100 UNIT/ML IV SOLN
500.0000 [IU] | Freq: Once | INTRAVENOUS | Status: AC
  Administered 2016-04-15: 500 [IU] via INTRAVENOUS

## 2016-04-15 MED ORDER — SODIUM CHLORIDE 0.9% FLUSH
10.0000 mL | INTRAVENOUS | Status: DC | PRN
  Administered 2016-04-15: 10 mL via INTRAVENOUS
  Filled 2016-04-15: qty 10

## 2016-04-15 NOTE — Patient Instructions (Signed)
Cancer Center at [facility] Discharge Instructions  RECOMMENDATIONS MADE BY THE CONSULTANT AND ANY TEST RESULTS WILL BE SENT TO YOUR REFERRING PHYSICIAN.  Port flush today.    Thank you for choosing [facility] at [facility] to provide your oncology and hematology care.  To afford each patient quality time with our provider, please arrive at least 15 minutes before your scheduled appointment time.   Beginning January 23rd 2017 lab work for the Cancer Center will be done in the  Main lab at [facility] on 1st floor. If you have a lab appointment with the Cancer Center please come in thru the  Main Entrance and check in at the main information desk  You need to re-schedule your appointment should you arrive 10 or more minutes late.  We strive to give you quality time with our providers, and arriving late affects you and other patients whose appointments are after yours.  Also, if you no show three or more times for appointments you may be dismissed from the clinic at the providers discretion.     Again, thank you for choosing [facility].  Our hope is that these requests will decrease the amount of time that you wait before being seen by our physicians.       _____________________________________________________________  Should you have questions after your visit to  Cancer Center, please contact our office at [phone] between the hours of 8:30 a.m. and 4:30 p.m.  Voicemails left after 4:30 p.m. will not be returned until the following business day.  For prescription refill requests, have your pharmacy contact our office.         Resources For Cancer Patients and their Caregivers ? American Cancer Society: Can assist with transportation, wigs, general needs, runs Look Good Feel Better.        1-888-227-6333 ? Cancer Care: Provides financial assistance, online support groups, medication/co-pay assistance.   1-800-813-HOPE (4673) ? [facility] Assists [place] cancer patients and their families through emotional , educational and financial support.  336-427-4357 ? [place] DSS Where to apply for food stamps, Medicaid and utility assistance. 336-342-1394 ? RCATS: Transportation to medical appointments. 336-347-2287 ? Social Security Administration: May apply for disability if have a Stage IV cancer. 336-342-7796 1-800-772-1213 ? [place] Aging, Disability and Transit Services: Assists with nutrition, care and transit needs. 336-349-2343  Cancer Center Support Programs: @10RELATIVEDAYS@ > Cancer Support Group  2nd Tuesday of the month 1pm-2pm, Journey Room  > Creative Journey  3rd Tuesday of the month 1130am-1pm, Journey Room  > Look Good Feel Better  1st Wednesday of the month 10am-12 noon, Journey Room (Call American Cancer Society to register 1-800-395-5775)    

## 2016-04-15 NOTE — Patient Instructions (Signed)
[place] at [facility] Discharge Instructions  RECOMMENDATIONS MADE BY THE CONSULTANT AND ANY TEST RESULTS WILL BE SENT TO YOUR REFERRING PHYSICIAN.  You were seen by Dr. Whitney Muse today. She will write prescriptions for your mastectomy bras and prosthesis. You also had your port flushed today during the visit. Dr. Whitney Muse will see you in a year for you follow-up visit. Call the clinic with any questions or concerns.    Thank you for choosing [place] at [facility] to provide your oncology and hematology care.  To afford each patient quality time with our provider, please arrive at least 15 minutes before your scheduled appointment time.   Beginning January 23rd 2017 lab work for the [facility] will be done in the  Main lab at [facility] on 1st floor. If you have a lab appointment with the [facility] please come in thru the  Main Entrance and check in at the main information desk  You need to re-schedule your appointment should you arrive 10 or more minutes late.  We strive to give you quality time with our providers, and arriving late affects you and other patients whose appointments are after yours.  Also, if you no show three or more times for appointments you may be dismissed from the clinic at the providers discretion.     Again, thank you for choosing [facility].  Our hope is that these requests will decrease the amount of time that you wait before being seen by our physicians.       _____________________________________________________________  Should you have questions after your visit to [facility], please contact our office at [phone] between the hours of 8:30 a.m. and 4:30 p.m.  Voicemails left after 4:30 p.m. will not be returned until the following business day.  For prescription refill requests, have your pharmacy contact our office.         Resources For Cancer Patients and their  Caregivers ? American Cancer Society: Can assist with transportation, wigs, general needs, runs Look Good Feel Better.        845-421-2580 ? Cancer Care: Provides financial assistance, online support groups, medication/co-pay assistance.  1-800-813-HOPE 862-431-2064) ? [facility] Assists [place] cancer patients and their families through emotional , educational and financial support.  510-091-7264 ? [place] DSS Where to apply for food stamps, Medicaid and utility assistance. 726-332-2027 ? RCATS: Transportation to medical appointments. 870-336-3024 ? Social Security Administration: May apply for disability if have a Stage IV cancer. 249-520-2344 239-457-8655 ? [employer], Disability and Transit Services: Assists with nutrition, care and transit needs. [facility] Support Programs: @10RELATIVEDAYS @ > Cancer Support Group  2nd Tuesday of the month 1pm-2pm, Journey Room  > Creative Journey  3rd Tuesday of the month 1130am-1pm, Journey Room  > Look Good Feel Better  1st Wednesday of the month 10am-12 noon, Journey Room (Call [place] to register [phone])

## 2016-04-15 NOTE — Progress Notes (Signed)
Emma Dawson presented for Portacath access and flush. Proper placement of portacath confirmed by CXR. Portacath located left chest wall accessed with  H 20 needle. Good blood return present. Portacath flushed with 20ml NS and 500U/5ml Heparin and needle removed intact. Procedure without incident. Patient tolerated procedure well.   

## 2016-04-15 NOTE — Progress Notes (Signed)
[name], MD [address] / [place] [phone]   Chronic kidney disease stage 3  stage IIIB ductal carcinoma right breast, HER-2/neu positive, ER- PR-  the primary tumor 6 cm in size she with modified radical mastectomy on 05/06/2003 followed by doxorubicin and Cytoxan for 6 cycles followed by 4 cycles of Taxotere and 48 weeks of Herceptin  DIAGNOSIS: Invasive ductal carcinoma of breast   Staging form: Breast, AJCC 6th Edition     Clinical: Stage IIIB - Signed by Baird Cancer, PA on 03/23/2012   CURRENT THERAPY: Observation  INTERVAL HISTORY: Emma Dawson 82 y.o. female returns for follow up. She is well and has no major complaints. She does not have mammograms any longer.  [name] is unaccompanied.  Her brother died this past week. He was 42 years old. She notes she traveled to his funeral.  She has been eating well, "all I can get". She lives alone and enjoys it. She does have a friend who visits.  Admits she has to go to the dentist soon, though she hates to go. She walks daily and has a garden in her yard with several vegetables in it.  She is not interested in mammograms. She would like a port flush while here today. She would like a prescription for a new bra and prostheses. She is here for yearly follow-up and breast exam.  She denies loss of weight or change in energy.   MEDICAL HISTORY: Past Medical History  Diagnosis Date  . Normocytic anemia   . History of chemotherapy     And radiation secondary to breast cancer  . Osteoarthritis   . Hypertension   . Hyperlipidemia   . History of breast cancer   . Allergic rhinitis   . Port catheter in place 03/05/2013  . Cancer ([place]) 03/2003    radiation and chemo ND mrm  . IGT (impaired glucose tolerance) 2014    DIET MANAGED    has Genital herpes; Hyperlipidemia LDL goal <100; Anemia; Essential hypertension; CAROTID ARTERY STENOSIS, LEFT; Allergic rhinitis; Osteoarthritis; SYNCOPE,  VASOVAGAL; CARDIAC MURMUR; Invasive ductal carcinoma of breast ([place]); Port catheter in place; Personal history of noncompliance with medical treatment, presenting hazards to health; Memory loss; Recurrent falls; Need for Tdap vaccination; Acute cystitis with hematuria; and Medicare annual wellness visit, subsequent on her problem list.     is allergic to aricept.   Current Outpatient Prescriptions on File Prior to Visit  Medication Sig Dispense Refill  . acetaminophen (TYLENOL) 500 MG tablet One tablet twice daily as needed for knee pain 60 tablet 2  . amLODipine (NORVASC) 10 MG tablet TAKE 1 TABLET BY MOUTH DAILY. 30 tablet 2  . aspirin 81 MG tablet Take 81 mg by mouth daily.     Marland Kitchen atorvastatin (LIPITOR) 40 MG tablet TAKE ONE TABLET BY MOUTH ONCE DAILY. 30 tablet 3  . clotrimazole-betamethasone (LOTRISONE) cream Apply 1 application topically 2 (two) times daily. Apply twice daily to rash for 2 weeks, then as needed 45 g 0  . Docusate Calcium (STOOL SOFTENER PO) Take 1 capsule by mouth as needed.    . hydrALAZINE (APRESOLINE) 10 MG tablet TAKE 1 TABLET BY MOUTH AT BEDTIME. 30 tablet 2  . losartan (COZAAR) 100 MG tablet TAKE 1 TABLET BY MOUTH ONCE A DAY. 30 tablet 2  . Multiple Vitamin (MULTIVITAMIN) tablet Take 1 tablet by mouth daily.     Current Facility-Administered Medications on File Prior to  Visit  Medication Dose Route Frequency Provider Last Rate Last Dose  . sodium chloride 0.9 % injection 10 mL  10 mL Intravenous PRN Baird Cancer, PA-C   10 mL at 03/23/12 9735     SURGICAL HISTORY: Past Surgical History  Procedure Laterality Date  . Mastectomy  2004    Right  . Breast surgery Right 05/06/2003    MRM  . Portacath placement    . Abdominal hysterectomy  1970    Fibroid tumors, benign    SOCIAL HISTORY: Social History   Social History  . Marital Status: Widowed    Spouse Name: N/A  . Number of Children: N/A  . Years of Education: N/A   Occupational History  .  Homemaker    Social History Main Topics  . Smoking status: Never Smoker   . Smokeless tobacco: Never Used  . Alcohol Use: No  . Drug Use: No  . Sexual Activity: Not Currently   Other Topics Concern  . Not on file   Social History Narrative   Widowed with boyfriend   Lives by herself and does own dressing and bathing   Has a caretaker who lives with her   12th grade education    FAMILY HISTORY: Family History  Problem Relation Age of Onset  . Stroke Mother   . Heart attack Father   . Coronary artery disease Father   . Coronary artery disease Brother   . Heart attack Sister   . Heart disease Sister   . Heart disease Sister     Review of Systems  Constitutional: Negative for fever, chills and weight loss.  HENT: Negative for hearing loss.   Eyes: Negative for blurred vision and double vision.  Respiratory: Negative for cough.   Cardiovascular: Negative for chest pain and palpitations.  Gastrointestinal: Negative for nausea, vomiting and abdominal pain.  Genitourinary: Negative for dysuria and hematuria.  Musculoskeletal: Negative for myalgias.  Skin: Negative for rash.  Neurological: Negative for dizziness, tingling and headaches.  Psychiatric/Behavioral: Negative for depression.  14 point review of systems was performed and is negative except as detailed under history of present illness and above   PHYSICAL EXAMINATION  ECOG PERFORMANCE STATUS: 0 - Asymptomatic  Filed Vitals:   04/15/16 0930  BP: 148/52  Pulse: 78  Temp: 98.4 F (36.9 C)  Resp: 20    Physical Exam  Constitutional: She is oriented to person, place, and time and well-developed, well-nourished, and in no distress.  HENT:  Head: Normocephalic and atraumatic.  Nose: Nose normal.  Mouth/Throat: Oropharynx is clear and moist. No oropharyngeal exudate.  Eyes: Conjunctivae and EOM are normal. Pupils are equal, round, and reactive to light. Right eye exhibits no discharge. Left eye exhibits no  discharge. No scleral icterus.  Neck: Normal range of motion. Neck supple. No tracheal deviation present. No thyromegaly present.  Cardiovascular: Normal rate and regular rhythm.  Exam reveals no gallop and no friction rub.   Murmur heard. Pulmonary/Chest: Effort normal and breath sounds normal. She has no wheezes. She has no rales.    Abdominal: Soft. Bowel sounds are normal. She exhibits no distension and no mass. There is no tenderness. There is no rebound and no guarding.  Musculoskeletal: Normal range of motion. She exhibits no edema.  Lymphadenopathy:    She has no cervical adenopathy.  Neurological: She is alert and oriented to person, place, and time. She has normal reflexes. No cranial nerve deficit. Gait normal. Coordination normal.  Skin: Skin is  warm and dry. No rash noted.  Psychiatric: Mood, memory, affect and judgment normal.  Nursing note and vitals reviewed.   LABORATORY DATA: I have reviewed the data as listed. CBC    Component Value Date/Time   WBC 9.8 06/02/2015 0743   RBC 3.76* 06/02/2015 0743   HGB 9.5* 06/02/2015 0743   HCT 29.6* 06/02/2015 0743   PLT 421* 06/02/2015 0743   MCV 78.7 06/02/2015 0743   MCH 25.3* 06/02/2015 0743   MCHC 32.1 06/02/2015 0743   RDW 14.9 06/02/2015 0743   LYMPHSABS 2.5 06/02/2015 0743   MONOABS 0.7 06/02/2015 0743   EOSABS 0.3 06/02/2015 0743   BASOSABS 0.1 06/02/2015 0743   CMP     Component Value Date/Time   NA 137 12/08/2015 0804   K 4.9 12/08/2015 0804   CL 102 12/08/2015 0804   CO2 29 12/08/2015 0804   GLUCOSE 83 12/08/2015 0804   BUN 16 12/08/2015 0804   CREATININE 1.08* 12/08/2015 0804   CREATININE 1.26* 04/03/2015 1100   CALCIUM 9.6 12/08/2015 0804   PROT 7.2 12/08/2015 0804   ALBUMIN 4.0 12/08/2015 0804   AST 17 12/08/2015 0804   ALT 13 12/08/2015 0804   ALKPHOS 58 12/08/2015 0804   BILITOT 0.5 12/08/2015 0804   GFRNONAA 48* 12/08/2015 0804   GFRNONAA 39* 04/03/2015 1100   GFRAA 56* 12/08/2015 0804    GFRAA 49* 04/03/2015 1100     ASSESSMENT and THERAPY PLAN:  Stage IIIB ductal carcinoma right breast, HER-2/neu positive, ER- PR-  the primary tumor 6 cm in size she with modified radical mastectomy on 05/06/2003 followed by doxorubicin and Cytoxan for 6 cycles followed by 4 cycles of Taxotere and 48 weeks of Herceptin Anemia CKD, stage III  She is doing quite well. She is very independent and active. She has no evidence of recurrent disease. She no longer wishes to get mammograms of the left breast. Breast exam was unremarkable today. She would like to continue with yearly observation therefore we will plan on seeing her back next year with a repeat physical exam. We will also obtain CBC and CMP given her prior chemotherapy history. She was given a prescription for mastectomy bras and prosthesis. She will be given a schedule for port flushes. She does not wish to have her port removed. Follow up in 1 year. She will continue to follow with Dr. Moshe Cipro for her primary care needs.   All questions were answered. The patient knows to call the clinic with any problems, questions or concerns. We can certainly see the patient much sooner if necessary.  This note was electronically signed.  This document serves as a record of services personally performed by Ancil Linsey, MD. It was created on her behalf by Arlyce Harman, a trained medical scribe. The creation of this record is based on the scribe's personal observations and the provider's statements to them. This document has been checked and approved by the attending provider.   I have reviewed the above documentation for accuracy and completeness, and I agree with the above.  Kelby Fam. Kaycee Haycraft MD

## 2016-04-17 ENCOUNTER — Encounter (HOSPITAL_COMMUNITY): Payer: Self-pay | Admitting: Hematology & Oncology

## 2016-04-19 ENCOUNTER — Other Ambulatory Visit: Payer: Self-pay | Admitting: Family Medicine

## 2016-05-19 ENCOUNTER — Other Ambulatory Visit: Payer: Self-pay | Admitting: Family Medicine

## 2016-05-26 ENCOUNTER — Other Ambulatory Visit: Payer: Self-pay | Admitting: Family Medicine

## 2016-05-30 ENCOUNTER — Encounter (HOSPITAL_COMMUNITY): Payer: Medicare Other

## 2016-05-30 DIAGNOSIS — E785 Hyperlipidemia, unspecified: Secondary | ICD-10-CM | POA: Diagnosis not present

## 2016-05-30 DIAGNOSIS — R7301 Impaired fasting glucose: Secondary | ICD-10-CM | POA: Diagnosis not present

## 2016-05-30 DIAGNOSIS — I10 Essential (primary) hypertension: Secondary | ICD-10-CM | POA: Diagnosis not present

## 2016-05-30 DIAGNOSIS — E559 Vitamin D deficiency, unspecified: Secondary | ICD-10-CM | POA: Diagnosis not present

## 2016-05-30 LAB — CBC WITH DIFFERENTIAL/PLATELET
BASOS ABS: 71 {cells}/uL (ref 0–200)
Basophils Relative: 1 %
EOS ABS: 497 {cells}/uL (ref 15–500)
EOS PCT: 7 %
HCT: 35 % (ref 35.0–45.0)
Hemoglobin: 11.2 g/dL — ABNORMAL LOW (ref 11.7–15.5)
LYMPHS PCT: 32 %
Lymphs Abs: 2272 cells/uL (ref 850–3900)
MCH: 25.3 pg — AB (ref 27.0–33.0)
MCHC: 32 g/dL (ref 32.0–36.0)
MCV: 79 fL — AB (ref 80.0–100.0)
MONOS PCT: 7 %
MPV: 8.1 fL (ref 7.5–12.5)
Monocytes Absolute: 497 cells/uL (ref 200–950)
Neutro Abs: 3763 cells/uL (ref 1500–7800)
Neutrophils Relative %: 53 %
PLATELETS: 286 10*3/uL (ref 140–400)
RBC: 4.43 MIL/uL (ref 3.80–5.10)
RDW: 14.9 % (ref 11.0–15.0)
WBC: 7.1 10*3/uL (ref 3.8–10.8)

## 2016-05-30 LAB — HEMOGLOBIN A1C
Hgb A1c MFr Bld: 5.8 % — ABNORMAL HIGH (ref <5.7)
Mean Plasma Glucose: 120 mg/dL

## 2016-05-30 LAB — COMPLETE METABOLIC PANEL WITH GFR
ALBUMIN: 4.3 g/dL (ref 3.6–5.1)
ALK PHOS: 62 U/L (ref 33–130)
ALT: 10 U/L (ref 6–29)
AST: 18 U/L (ref 10–35)
BUN: 14 mg/dL (ref 7–25)
CO2: 26 mmol/L (ref 20–31)
CREATININE: 1.14 mg/dL — AB (ref 0.60–0.88)
Calcium: 9 mg/dL (ref 8.6–10.4)
Chloride: 103 mmol/L (ref 98–110)
GFR, Est African American: 52 mL/min — ABNORMAL LOW (ref >=60)
GFR, Est Non African American: 45 mL/min — ABNORMAL LOW (ref >=60)
GLUCOSE: 94 mg/dL (ref 65–99)
POTASSIUM: 4.4 mmol/L (ref 3.5–5.3)
SODIUM: 139 mmol/L (ref 135–146)
TOTAL PROTEIN: 7.5 g/dL (ref 6.1–8.1)
Total Bilirubin: 0.4 mg/dL (ref 0.2–1.2)

## 2016-05-30 LAB — LIPID PANEL
Cholesterol: 203 mg/dL — ABNORMAL HIGH (ref 125–200)
HDL: 66 mg/dL (ref >=46)
LDL Cholesterol: 115 mg/dL (ref <130)
Total CHOL/HDL Ratio: 3.1 Ratio (ref <=5.0)
Triglycerides: 110 mg/dL (ref <150)
VLDL: 22 mg/dL (ref <30)

## 2016-05-30 LAB — TSH: TSH: 0.95 mIU/L

## 2016-05-31 LAB — VITAMIN D 25 HYDROXY (VIT D DEFICIENCY, FRACTURES): Vit D, 25-Hydroxy: 31 ng/mL (ref 30–100)

## 2016-06-17 ENCOUNTER — Encounter (HOSPITAL_COMMUNITY): Payer: Medicare Other | Attending: Hematology and Oncology

## 2016-06-17 VITALS — BP 147/58 | HR 72 | Temp 98.6°F | Resp 18 | Wt 149.6 lb

## 2016-06-17 DIAGNOSIS — Z853 Personal history of malignant neoplasm of breast: Secondary | ICD-10-CM | POA: Diagnosis not present

## 2016-06-17 DIAGNOSIS — Z452 Encounter for adjustment and management of vascular access device: Secondary | ICD-10-CM | POA: Diagnosis not present

## 2016-06-17 DIAGNOSIS — Z95828 Presence of other vascular implants and grafts: Secondary | ICD-10-CM

## 2016-06-17 DIAGNOSIS — C50919 Malignant neoplasm of unspecified site of unspecified female breast: Secondary | ICD-10-CM

## 2016-06-17 MED ORDER — HEPARIN SOD (PORK) LOCK FLUSH 100 UNIT/ML IV SOLN
500.0000 [IU] | Freq: Once | INTRAVENOUS | Status: AC
  Administered 2016-06-17: 500 [IU] via INTRAVENOUS

## 2016-06-17 MED ORDER — SODIUM CHLORIDE 0.9% FLUSH
20.0000 mL | INTRAVENOUS | Status: DC | PRN
  Administered 2016-06-17: 20 mL via INTRAVENOUS
  Filled 2016-06-17: qty 20

## 2016-06-17 NOTE — Patient Instructions (Signed)
[facility] at [facility] Discharge Instructions  RECOMMENDATIONS MADE BY THE CONSULTANT AND ANY TEST RESULTS WILL BE SENT TO YOUR REFERRING PHYSICIAN.  Port flush  Thank you for choosing [facility] [facility] at [facility] to provide your oncology and hematology care.  To afford each patient quality time with our provider, please arrive at least 15 minutes before your scheduled appointment time.   Beginning January 23rd 2017 lab work for the Cancer Center will be done in the  Main lab at [facility] on 1st floor. If you have a lab appointment with the Cancer Center please come in thru the  Main Entrance and check in at the main information desk  You need to re-schedule your appointment should you arrive 10 or more minutes late.  We strive to give you quality time with our providers, and arriving late affects you and other patients whose appointments are after yours.  Also, if you no show three or more times for appointments you may be dismissed from the clinic at the providers discretion.     Again, thank you for choosing [facility].  Our hope is that these requests will decrease the amount of time that you wait before being seen by our physicians.       _____________________________________________________________  Should you have questions after your visit to [facility], please contact our office at [phone] between the hours of 8:30 a.m. and 4:30 p.m.  Voicemails left after 4:30 p.m. will not be returned until the following business day.  For prescription refill requests, have your pharmacy contact our office.         Resources For Cancer Patients and their Caregivers ? American Cancer Society: Can assist with transportation, wigs, general needs, runs Look Good Feel Better.        1-888-227-6333 ? Cancer Care: Provides financial assistance, online support groups, medication/co-pay assistance.  1-800-813-HOPE  (4673) ? [facility] Assists [place] cancer patients and their families through emotional , educational and financial support.  336-427-4357 ? [place] DSS Where to apply for food stamps, Medicaid and utility assistance. 336-342-1394 ? RCATS: Transportation to medical appointments. 336-347-2287 ? Social Security Administration: May apply for disability if have a Stage IV cancer. 336-342-7796 1-800-772-1213 ? [place] Aging, Disability and Transit Services: Assists with nutrition, care and transit needs. 336-349-2343  Cancer Center Support Programs: @10RELATIVEDAYS@ > Cancer Support Group  2nd Tuesday of the month 1pm-2pm, Journey Room  > Creative Journey  3rd Tuesday of the month 1130am-1pm, Journey Room  > Look Good Feel Better  1st Wednesday of the month 10am-12 noon, Journey Room (Call American Cancer Society to register 1-800-395-5775)    

## 2016-06-17 NOTE — Progress Notes (Signed)
Emma Dawson presented for Portacath access and flush. Proper placement of portacath confirmed by CXR. Portacath located left chest wall accessed with  H 20 needle. Good blood return present. Portacath flushed with 20ml NS and 500U/5ml Heparin and needle removed intact. Procedure without incident. Patient tolerated procedure well.   

## 2016-07-13 ENCOUNTER — Other Ambulatory Visit: Payer: Self-pay

## 2016-07-13 MED ORDER — ATORVASTATIN CALCIUM 40 MG PO TABS: 40.0000 mg | ORAL_TABLET | Freq: Every day | ORAL | 2 refills | Status: DC

## 2016-07-18 ENCOUNTER — Other Ambulatory Visit: Payer: Self-pay | Admitting: Family Medicine

## 2016-07-20 ENCOUNTER — Encounter: Payer: Self-pay | Admitting: Family Medicine

## 2016-07-20 ENCOUNTER — Ambulatory Visit (INDEPENDENT_AMBULATORY_CARE_PROVIDER_SITE_OTHER): Payer: Medicare Other | Admitting: Family Medicine

## 2016-07-20 DIAGNOSIS — Z9119 Patient's noncompliance with other medical treatment and regimen: Secondary | ICD-10-CM

## 2016-07-20 DIAGNOSIS — E785 Hyperlipidemia, unspecified: Secondary | ICD-10-CM

## 2016-07-20 DIAGNOSIS — Z23 Encounter for immunization: Secondary | ICD-10-CM | POA: Diagnosis not present

## 2016-07-20 DIAGNOSIS — I10 Essential (primary) hypertension: Secondary | ICD-10-CM | POA: Diagnosis not present

## 2016-07-20 DIAGNOSIS — Z91199 Patient's noncompliance with other medical treatment and regimen due to unspecified reason: Secondary | ICD-10-CM

## 2016-07-20 MED ORDER — ATORVASTATIN CALCIUM 40 MG PO TABS: 40.0000 mg | ORAL_TABLET | Freq: Every day | ORAL | 5 refills | Status: DC

## 2016-07-20 MED ORDER — AMLODIPINE BESYLATE 10 MG PO TABS: 10.0000 mg | ORAL_TABLET | Freq: Every day | ORAL | 5 refills | Status: DC

## 2016-07-20 MED ORDER — LOSARTAN POTASSIUM 100 MG PO TABS: 100.0000 mg | ORAL_TABLET | Freq: Every day | ORAL | 5 refills | Status: DC

## 2016-07-20 MED ORDER — HYDRALAZINE HCL 10 MG PO TABS: 10.0000 mg | ORAL_TABLET | Freq: Every day | ORAL | 5 refills | Status: DC

## 2016-07-20 NOTE — Progress Notes (Signed)
   Emma Dawson     MRN: [id number]      DOB: 07-Jan-1932   HPI Emma Dawson is here for follow up and re-evaluation of chronic medical conditions, medication management and review of any available recent lab and radiology data.  Preventive health is updated, specifically  Cancer screening and Immunization. Continues to refuse cancer screeening Questions or concerns regarding consultations or procedures which the PT has had in the interim are  addressed. The PT denies any adverse reactions to current medications since the last visit.  There are no new concerns.  There are no specific complaints   ROS Denies recent fever or chills. Denies sinus pressure, nasal congestion, ear pain or sore throat. Denies chest congestion, productive cough or wheezing. Denies chest pains, palpitations and leg swelling Denies abdominal pain, nausea, vomiting,diarrhea or constipation.   Denies dysuria, frequency, hesitancy or incontinence. Denies joint pain, swelling and limitation in mobility. Denies headaches, seizures, numbness, or tingling. Denies depression, anxiety or insomnia. Denies skin break down or rash.   PE  BP 138/62   Pulse 81   Resp 16   Ht 5\' 2"  (1.575 m)   Wt 147 lb (66.7 kg)   SpO2 99%   BMI 26.89 kg/m   Patient alert and oriented and in no cardiopulmonary distress.  HEENT: No facial asymmetry, EOMI,   oropharynx pink and moist.  Neck supple no JVD, no mass.  Chest: Clear to auscultation bilaterally.  CVS: S1, S2 no murmurs, no S3.Regular rate.  ABD: Soft non tender.   Ext: No edema  MS: Adequate ROM spine, shoulders, hips and knees.  Skin: Intact, no ulcerations or rash noted.  Psych: Good eye contact, normal affect. Memory intact not anxious or depressed appearing.  CNS: CN 2-12 intact, power,  normal throughout.no focal deficits noted.   Assessment & Plan  Essential hypertension Controlled, no change in medication DASH diet and commitment to daily physical  activity for a minimum of 30 minutes discussed and encouraged, as a part of hypertension management. The importance of attaining a healthy weight is also discussed.  BP/Weight 07/20/2016 06/17/2016 04/15/2016 02/22/2016 02/22/2016 12/25/2015 [id number]  Systolic BP 0000000 Q000111Q 123456 123456 Q000111Q 0000000 Q000111Q  Diastolic BP 62 58 52 56 60 56 57  Wt. (Lbs) 147 149.6 150.6 - 150 - -  BMI 26.89 27.36 27.54 - 27.43 - -       Hyperlipidemia LDL goal <100 Hyperlipidemia:Low fat diet discussed and encouraged.   Lipid Panel  Lab Results  Component Value Date   CHOL 203 (H) 05/30/2016   HDL 66 05/30/2016   LDLCALC 115 05/30/2016   TRIG 110 05/30/2016   CHOLHDL 3.1 05/30/2016  Controlled, no change in medication     Personal history of noncompliance with medical treatment, presenting hazards to health Continues to refuse all cancer screening, though chooses to keep porta cath in place from breast cancer treatment Refusing pneumonia vaccine today but will keep in mind  Need for prophylactic vaccination and inoculation against influenza After obtaining informed consent, the vaccine is  administered by LPN.

## 2016-07-20 NOTE — Patient Instructions (Signed)
Annual wellness April 4 or after, call if you need me sooner  Flu vaccine today  Keep on with good eating habits, and regu;lar exercise  Careful not to fall!  Thank you  for choosing [facility]. We consider it a privelige to serve you.  Delivering excellent health care in a caring and  compassionate way is our goal.  Partnering with you,  so that together we can achieve this goal is our strategy.

## 2016-07-21 NOTE — Assessment & Plan Note (Signed)
After obtaining informed consent, the vaccine is  administered by LPN.  

## 2016-07-21 NOTE — Assessment & Plan Note (Signed)
Continues to refuse all cancer screening, though chooses to keep porta cath in place from breast cancer treatment Refusing pneumonia vaccine today but will keep in mind

## 2016-07-21 NOTE — Assessment & Plan Note (Signed)
Hyperlipidemia:Low fat diet discussed and encouraged.   Lipid Panel  Lab Results  Component Value Date   CHOL 203 (H) 05/30/2016   HDL 66 05/30/2016   LDLCALC 115 05/30/2016   TRIG 110 05/30/2016   CHOLHDL 3.1 05/30/2016  Controlled, no change in medication

## 2016-07-21 NOTE — Assessment & Plan Note (Signed)
Controlled, no change in medication DASH diet and commitment to daily physical activity for a minimum of 30 minutes discussed and encouraged, as a part of hypertension management. The importance of attaining a healthy weight is also discussed.  BP/Weight 07/20/2016 06/17/2016 04/15/2016 02/22/2016 02/22/2016 12/25/2015 [id number]  Systolic BP 0000000 Q000111Q 123456 123456 Q000111Q 0000000 Q000111Q  Diastolic BP 62 58 52 56 60 56 57  Wt. (Lbs) 147 149.6 150.6 - 150 - -  BMI 26.89 27.36 27.54 - 27.43 - -

## 2016-08-12 ENCOUNTER — Encounter (HOSPITAL_COMMUNITY): Payer: Self-pay

## 2016-08-12 ENCOUNTER — Encounter (HOSPITAL_COMMUNITY): Payer: Medicare Other | Attending: Hematology and Oncology

## 2016-08-12 DIAGNOSIS — Z853 Personal history of malignant neoplasm of breast: Secondary | ICD-10-CM

## 2016-08-12 DIAGNOSIS — N183 Chronic kidney disease, stage 3 (moderate): Secondary | ICD-10-CM

## 2016-08-12 DIAGNOSIS — Z452 Encounter for adjustment and management of vascular access device: Secondary | ICD-10-CM | POA: Diagnosis not present

## 2016-08-12 DIAGNOSIS — D649 Anemia, unspecified: Secondary | ICD-10-CM | POA: Diagnosis not present

## 2016-08-12 MED ORDER — HEPARIN SOD (PORK) LOCK FLUSH 100 UNIT/ML IV SOLN
500.0000 [IU] | Freq: Once | INTRAVENOUS | Status: AC
  Administered 2016-08-12: 500 [IU] via INTRAVENOUS

## 2016-08-12 MED ORDER — HEPARIN SOD (PORK) LOCK FLUSH 100 UNIT/ML IV SOLN
INTRAVENOUS | Status: AC
  Filled 2016-08-12: qty 5

## 2016-08-12 MED ORDER — SODIUM CHLORIDE 0.9% FLUSH
10.0000 mL | INTRAVENOUS | Status: DC | PRN
  Administered 2016-08-12: 10 mL via INTRAVENOUS
  Filled 2016-08-12: qty 10

## 2016-08-12 NOTE — Patient Instructions (Signed)
[facility] at [facility] Discharge Instructions  RECOMMENDATIONS MADE BY THE CONSULTANT AND ANY TEST RESULTS WILL BE SENT TO YOUR REFERRING PHYSICIAN.  Port flush today. Return as scheduled for port flushes. Return as scheduled for office visit.   Thank you for choosing [facility] at [facility] to provide your oncology and hematology care.  To afford each patient quality time with our provider, please arrive at least 15 minutes before your scheduled appointment time.   Beginning January 23rd 2017 lab work for the Cancer Center will be done in the  Main lab at [facility] on 1st floor. If you have a lab appointment with the Cancer Center please come in thru the  Main Entrance and check in at the main information desk  You need to re-schedule your appointment should you arrive 10 or more minutes late.  We strive to give you quality time with our providers, and arriving late affects you and other patients whose appointments are after yours.  Also, if you no show three or more times for appointments you may be dismissed from the clinic at the providers discretion.     Again, thank you for choosing [facility].  Our hope is that these requests will decrease the amount of time that you wait before being seen by our physicians.       _____________________________________________________________  Should you have questions after your visit to [facility], please contact our office at [phone] between the hours of 8:30 a.m. and 4:30 p.m.  Voicemails left after 4:30 p.m. will not be returned until the following business day.  For prescription refill requests, have your pharmacy contact our office.         Resources For Cancer Patients and their Caregivers ? American Cancer Society: Can assist with transportation, wigs, general needs, runs Look Good Feel Better.        1-888-227-6333 ? Cancer Care: Provides financial  assistance, online support groups, medication/co-pay assistance.  1-800-813-HOPE (4673) ? [facility] Assists [place] cancer patients and their families through emotional , educational and financial support.  336-427-4357 ? [place] DSS Where to apply for food stamps, Medicaid and utility assistance. 336-342-1394 ? [facility]: Transportation to medical appointments. 336-347-2287 ? Social Security Administration: May apply for disability if have a Stage IV cancer. 336-342-7796 1-800-772-1213 ? [place] Aging, Disability and Transit Services: Assists with nutrition, care and transit needs. 336-349-2343  Cancer Center Support Programs: @[email or web address]@ > Cancer Support Group  2nd Tuesday of the month 1pm-2pm, Journey Room  > Creative Journey  3rd Tuesday of the month 1130am-1pm, Journey Room  > Look Good Feel Better  1st Wednesday of the month 10am-12 noon, Journey Room (Call American Cancer Society to register 1-800-395-5775)   

## 2016-08-12 NOTE — Progress Notes (Signed)
Emma Dawson presented for Portacath access and flush.  Portacath located left chest wall accessed with  H 20 needle.  Good blood return present. Portacath flushed with 52ml NS and 500U/73ml Heparin and needle removed intact.  Procedure tolerated well and without incident.

## 2016-10-07 ENCOUNTER — Encounter (HOSPITAL_COMMUNITY): Payer: Self-pay

## 2016-10-07 ENCOUNTER — Encounter (HOSPITAL_COMMUNITY): Payer: Medicare Other | Attending: Hematology and Oncology

## 2016-10-07 VITALS — BP 125/58 | HR 79 | Temp 98.3°F | Resp 20

## 2016-10-07 DIAGNOSIS — Z853 Personal history of malignant neoplasm of breast: Secondary | ICD-10-CM

## 2016-10-07 DIAGNOSIS — Z452 Encounter for adjustment and management of vascular access device: Secondary | ICD-10-CM

## 2016-10-07 DIAGNOSIS — C50919 Malignant neoplasm of unspecified site of unspecified female breast: Secondary | ICD-10-CM

## 2016-10-07 DIAGNOSIS — Z95828 Presence of other vascular implants and grafts: Secondary | ICD-10-CM

## 2016-10-07 MED ORDER — SODIUM CHLORIDE 0.9% FLUSH
10.0000 mL | INTRAVENOUS | Status: DC | PRN
  Administered 2016-10-07: 10 mL via INTRAVENOUS
  Filled 2016-10-07: qty 10

## 2016-10-07 MED ORDER — HEPARIN SOD (PORK) LOCK FLUSH 100 UNIT/ML IV SOLN
500.0000 [IU] | Freq: Once | INTRAVENOUS | Status: AC
  Administered 2016-10-07: 500 [IU] via INTRAVENOUS
  Filled 2016-10-07: qty 5

## 2016-10-07 NOTE — Progress Notes (Signed)
Emma Dawson presented for Portacath access and flush. Proper placement of portacath confirmed by CXR. Portacath located lt chest wall accessed with  H 20 needle. No blood return. Portacath flushed with 39ml NS and 500U/81ml Heparin and needle removed intact. Procedure without incident. Patient tolerated procedure well.

## 2016-12-02 ENCOUNTER — Encounter (HOSPITAL_COMMUNITY): Payer: Medicare Other

## 2016-12-06 ENCOUNTER — Encounter (HOSPITAL_COMMUNITY): Payer: Medicare Other | Attending: Hematology and Oncology

## 2016-12-06 ENCOUNTER — Encounter (HOSPITAL_COMMUNITY): Payer: Self-pay

## 2016-12-06 VITALS — BP 132/60 | HR 109 | Temp 98.4°F | Resp 18

## 2016-12-06 DIAGNOSIS — Z452 Encounter for adjustment and management of vascular access device: Secondary | ICD-10-CM

## 2016-12-06 DIAGNOSIS — Z853 Personal history of malignant neoplasm of breast: Secondary | ICD-10-CM

## 2016-12-06 DIAGNOSIS — C50919 Malignant neoplasm of unspecified site of unspecified female breast: Secondary | ICD-10-CM

## 2016-12-06 MED ORDER — HEPARIN SOD (PORK) LOCK FLUSH 100 UNIT/ML IV SOLN
500.0000 [IU] | Freq: Once | INTRAVENOUS | Status: AC
  Administered 2016-12-06: 500 [IU] via INTRAVENOUS
  Filled 2016-12-06: qty 5

## 2016-12-06 MED ORDER — SODIUM CHLORIDE 0.9% FLUSH
20.0000 mL | INTRAVENOUS | Status: DC | PRN
  Administered 2016-12-06: 20 mL via INTRAVENOUS
  Filled 2016-12-06: qty 20

## 2016-12-06 NOTE — Patient Instructions (Signed)
[facility] at [facility] Discharge Instructions  RECOMMENDATIONS MADE BY THE CONSULTANT AND ANY TEST RESULTS WILL BE SENT TO YOUR REFERRING PHYSICIAN.  Port flush.    Thank you for choosing [facility] at [facility] to provide your oncology and hematology care.  To afford each patient quality time with our provider, please arrive at least 15 minutes before your scheduled appointment time.    If you have a lab appointment with the Cancer Center please come in thru the  Main Entrance and check in at the main information desk  You need to re-schedule your appointment should you arrive 10 or more minutes late.  We strive to give you quality time with our providers, and arriving late affects you and other patients whose appointments are after yours.  Also, if you no show three or more times for appointments you may be dismissed from the clinic at the providers discretion.     Again, thank you for choosing [facility].  Our hope is that these requests will decrease the amount of time that you wait before being seen by our physicians.       _____________________________________________________________  Should you have questions after your visit to  Cancer Center, please contact our office at [phone] between the hours of 8:30 a.m. and 4:30 p.m.  Voicemails left after 4:30 p.m. will not be returned until the following business day.  For prescription refill requests, have your pharmacy contact our office.       Resources For Cancer Patients and their Caregivers ? American Cancer Society: Can assist with transportation, wigs, general needs, runs Look Good Feel Better.        1-888-227-6333 ? Cancer Care: Provides financial assistance, online support groups, medication/co-pay assistance.  1-800-813-HOPE (4673) ? [facility] Assists [place] cancer patients and their families through emotional ,  educational and financial support.  336-427-4357 ? [place] DSS Where to apply for food stamps, Medicaid and utility assistance. 336-342-1394 ? [facility]: Transportation to medical appointments. 336-347-2287 ? Social Security Administration: May apply for disability if have a Stage IV cancer. 336-342-7796 1-800-772-1213 ? [place] Aging, Disability and Transit Services: Assists with nutrition, care and transit needs. 336-349-2343  Cancer Center Support Programs: @10RELATIVEDAYS@ > Cancer Support Group  2nd Tuesday of the month 1pm-2pm, Journey Room  > Creative Journey  3rd Tuesday of the month 1130am-1pm, Journey Room  > Look Good Feel Better  1st Wednesday of the month 10am-12 noon, Journey Room (Call American Cancer Society to register 1-800-395-5775)    

## 2016-12-06 NOTE — Progress Notes (Signed)
Emma Dawson presented for Portacath access and flush. Proper placement of portacath confirmed by CXR. Portacath located left chest wall accessed with  H 20 needle. No blood return but flushes well without any pain or difficulty.   Portacath flushed with 47ml NS and 500U/63ml Heparin and needle removed intact. Procedure without incident. Patient tolerated procedure well.

## 2016-12-16 ENCOUNTER — Encounter: Payer: Self-pay | Admitting: Family Medicine

## 2016-12-16 ENCOUNTER — Ambulatory Visit (INDEPENDENT_AMBULATORY_CARE_PROVIDER_SITE_OTHER): Payer: Medicare Other | Admitting: Family Medicine

## 2016-12-16 ENCOUNTER — Ambulatory Visit (HOSPITAL_COMMUNITY)
Admission: RE | Admit: 2016-12-16 | Discharge: 2016-12-16 | Disposition: A | Discharge status: Home / Self Care | Payer: Medicare Other | Source: Ambulatory Visit | Attending: Family Medicine | Admitting: Family Medicine

## 2016-12-16 VITALS — BP 140/67 | HR 92 | Temp 98.2°F | Resp 16 | Ht 62.0 in | Wt 148.0 lb

## 2016-12-16 DIAGNOSIS — E559 Vitamin D deficiency, unspecified: Secondary | ICD-10-CM | POA: Diagnosis not present

## 2016-12-16 DIAGNOSIS — G8929 Other chronic pain: Secondary | ICD-10-CM

## 2016-12-16 DIAGNOSIS — M25562 Pain in left knee: Secondary | ICD-10-CM

## 2016-12-16 DIAGNOSIS — R7301 Impaired fasting glucose: Secondary | ICD-10-CM | POA: Diagnosis not present

## 2016-12-16 DIAGNOSIS — R296 Repeated falls: Secondary | ICD-10-CM

## 2016-12-16 DIAGNOSIS — D649 Anemia, unspecified: Secondary | ICD-10-CM

## 2016-12-16 DIAGNOSIS — M25561 Pain in right knee: Secondary | ICD-10-CM | POA: Diagnosis not present

## 2016-12-16 DIAGNOSIS — Z9181 History of falling: Secondary | ICD-10-CM

## 2016-12-16 DIAGNOSIS — Z91199 Patient's noncompliance with other medical treatment and regimen due to unspecified reason: Secondary | ICD-10-CM

## 2016-12-16 DIAGNOSIS — Z9119 Patient's noncompliance with other medical treatment and regimen: Secondary | ICD-10-CM

## 2016-12-16 DIAGNOSIS — E785 Hyperlipidemia, unspecified: Secondary | ICD-10-CM | POA: Diagnosis not present

## 2016-12-16 DIAGNOSIS — R2689 Other abnormalities of gait and mobility: Secondary | ICD-10-CM

## 2016-12-16 DIAGNOSIS — I10 Essential (primary) hypertension: Secondary | ICD-10-CM | POA: Diagnosis not present

## 2016-12-16 LAB — CBC
HCT: 34.5 % — ABNORMAL LOW (ref 35.0–45.0)
Hemoglobin: 11 g/dL — ABNORMAL LOW (ref 11.7–15.5)
MCH: 25.5 pg — ABNORMAL LOW (ref 27.0–33.0)
MCHC: 31.9 g/dL — AB (ref 32.0–36.0)
MCV: 79.9 fL — ABNORMAL LOW (ref 80.0–100.0)
MPV: 7.8 fL (ref 7.5–12.5)
PLATELETS: 305 10*3/uL (ref 140–400)
RBC: 4.32 MIL/uL (ref 3.80–5.10)
RDW: 15.5 % — AB (ref 11.0–15.0)
WBC: 9.1 10*3/uL (ref 3.8–10.8)

## 2016-12-16 NOTE — Progress Notes (Signed)
   Emma Dawson     MRN: [id number]      DOB: Mar 23, 1932   HPI Emma Dawson is here today with a concern of having fallen twice in the past 2 weeks. Her niece accompanies her and is uncertain of the mechanism, but does note that her gait is unsteady. Pt reports slipping on wet floor on one occasion but niece reports seeing no water in the area C/o chronic knee pain and some buckling , especially of the left knee, has been using a cane since the [place] fall and requests shower chair and bedside commode ROS Denies recent fever or chills. Denies sinus pressure, nasal congestion, ear pain or sore throat. Denies chest congestion, productive cough or wheezing. Denies chest pains, palpitations and leg swelling Denies abdominal pain, nausea, vomiting,diarrhea or constipation.   Denies dysuria, frequency, hesitancy or incontinence. Denies headaches, seizures, numbness, or tingling. Denies depression, anxiety or insomnia. Denies skin break down or rash.   PE  BP 140/67 (BP Location: Left Arm, Patient Position: Sitting, Cuff Size: Normal)   Pulse 92   Temp 98.2 F (36.8 C) (Oral)   Resp 16   Ht 5\' 2"  (1.575 m)   Wt 148 lb (67.1 kg)   SpO2 98%   BMI 27.07 kg/m   Patient alert and oriented and in no cardiopulmonary distress.  HEENT: No facial asymmetry, EOMI,   oropharynx pink and moist.  Neck supple no JVD, no mass.  Chest: Clear to auscultation bilaterally.  CVS: S1, S2 no murmurs, no S3.Regular rate.  ABD: Soft non tender.   Ext: No edema  MS: Decreased ROM spine, shoulders, hips and knees.Deformity of knees , right more than left Abnormal gait  Skin: Intact, no ulcerations or rash noted.  Psych: Good eye contact, normal affect. not anxious or depressed appearing.  CNS: CN 2-12 intact, power,  normal throughout.no focal deficits noted.   Assessment & Plan  Recurrent falls while walking 2 falls in past week, refer PT, home safety discussed with pt and her niece. Needs  shower chair and bedside commode  Personal history of noncompliance with medical treatment, presenting hazards to health Refuses pneumonia vaccine  Need for assistance due to unsteady gait Two falls in 2 weeks, will benefit from PT  Knee pain, bilateral Increased bilateral knee pain and stiffness, left worse than right with falls, x ray of both knees  Hyperlipidemia LDL goal <100 Updated lab needed at/ before next visit. Hyperlipidemia:Low fat diet discussed and encouraged.   Lipid Panel  Lab Results  Component Value Date   CHOL 203 (H) 05/30/2016   HDL 66 05/30/2016   LDLCALC 115 05/30/2016   TRIG 110 05/30/2016   CHOLHDL 3.1 05/30/2016       Essential hypertension Controlled, no change in medication

## 2016-12-16 NOTE — Assessment & Plan Note (Signed)
Updated lab needed at/ before next visit. Hyperlipidemia:Low fat diet discussed and encouraged.   Lipid Panel  Lab Results  Component Value Date   CHOL 203 (H) 05/30/2016   HDL 66 05/30/2016   LDLCALC 115 05/30/2016   TRIG 110 05/30/2016   CHOLHDL 3.1 05/30/2016

## 2016-12-16 NOTE — Assessment & Plan Note (Signed)
Two falls in 2 weeks, will benefit from PT

## 2016-12-16 NOTE — Assessment & Plan Note (Signed)
Refuses pneumonia vaccine

## 2016-12-16 NOTE — Assessment & Plan Note (Signed)
Controlled, no change in medication  

## 2016-12-16 NOTE — Patient Instructions (Addendum)
Wellness in April as before  X ray of both knees today  You are to physical therapy twice weekl for nex t 4 weeks  No rugs , good light , no extra furniture, tennis shoes even at home  CBC, lipid, cmp, HBA1C, TSH, Vit D today  NO MORE FALLS  Shower chair and bedside commode to be delivered  Fall Prevention in the Home Falls can cause injuries and can affect people from all age groups. There are many simple things that you can do to make your home safe and to help prevent falls. What can I do on the outside of my home?  Regularly repair the edges of walkways and driveways and fix any cracks.  Remove high doorway thresholds.  Trim any shrubbery on the main path into your home.  Use bright outdoor lighting.  Clear walkways of debris and clutter, including tools and rocks.  Regularly check that handrails are securely fastened and in good repair. Both sides of any steps should have handrails.  Install guardrails along the edges of any raised decks or porches.  Have leaves, snow, and ice cleared regularly.  Use sand or salt on walkways during winter months.  In the garage, clean up any spills right away, including grease or oil spills. What can I do in the bathroom?  Use night lights.  Install grab bars by the toilet and in the tub and shower. Do not use towel bars as grab bars.  Use non-skid mats or decals on the floor of the tub or shower.  If you need to sit down while you are in the shower, use a plastic, non-slip stool.  Keep the floor dry. Immediately clean up any water that spills on the floor.  Remove soap buildup in the tub or shower on a regular basis.  Attach bath mats securely with double-sided non-slip rug tape.  Remove throw rugs and other tripping hazards from the floor. What can I do in the bedroom?  Use night lights.  Make sure that a bedside light is easy to reach.  Do not use oversized bedding that drapes onto the floor.  Have a firm chair  that has side arms to use for getting dressed.  Remove throw rugs and other tripping hazards from the floor. What can I do in the kitchen?  Clean up any spills right away.  Avoid walking on wet floors.  Place frequently used items in easy-to-reach places.  If you need to reach for something above you, use a sturdy step stool that has a grab bar.  Keep electrical cables out of the way.  Do not use floor polish or wax that makes floors slippery. If you have to use wax, make sure that it is non-skid floor wax.  Remove throw rugs and other tripping hazards from the floor. What can I do in the stairways?  Do not leave any items on the stairs.  Make sure that there are handrails on both sides of the stairs. Fix handrails that are broken or loose. Make sure that handrails are as long as the stairways.  Check any carpeting to make sure that it is firmly attached to the stairs. Fix any carpet that is loose or worn.  Avoid having throw rugs at the top or bottom of stairways, or secure the rugs with carpet tape to prevent them from moving.  Make sure that you have a light switch at the top of the stairs and the bottom of the stairs. If you  do not have them, have them installed. What are some other fall prevention tips?  Wear closed-toe shoes that fit well and support your feet. Wear shoes that have rubber soles or low heels.  When you use a stepladder, make sure that it is completely opened and that the sides are firmly locked. Have someone hold the ladder while you are using it. Do not climb a closed stepladder.  Add color or contrast paint or tape to grab bars and handrails in your home. Place contrasting color strips on the first and last steps.  Use mobility aids as needed, such as canes, walkers, scooters, and crutches.  Turn on lights if it is dark. Replace any light bulbs that burn out.  Set up furniture so that there are clear paths. Keep the furniture in the same spot.  Fix  any uneven floor surfaces.  Choose a carpet design that does not hide the edge of steps of a stairway.  Be aware of any and all pets.  Review your medicines with your healthcare provider. Some medicines can cause dizziness or changes in blood pressure, which increase your risk of falling. Talk with your health care provider about other ways that you can decrease your risk of falls. This may include working with a physical therapist or trainer to improve your strength, balance, and endurance. This information is not intended to replace advice given to you by your health care provider. Make sure you discuss any questions you have with your health care provider. Document Released: 10/28/2002 Document Revised: 04/05/2016 Document Reviewed: 12/12/2014 Elsevier Interactive Patient Education  2017 [employer].

## 2016-12-16 NOTE — Assessment & Plan Note (Signed)
Increased bilateral knee pain and stiffness, left worse than right with falls, x ray of both knees

## 2016-12-16 NOTE — Assessment & Plan Note (Signed)
2 falls in past week, refer PT, home safety discussed with pt and her niece. Needs shower chair and bedside commode

## 2016-12-17 LAB — HEMOGLOBIN A1C
Hgb A1c MFr Bld: 5.5 % (ref <5.7)
MEAN PLASMA GLUCOSE: 111 mg/dL

## 2016-12-17 LAB — LIPID PANEL
CHOLESTEROL: 195 mg/dL (ref <200)
HDL: 61 mg/dL (ref >50)
LDL CALC: 115 mg/dL — AB (ref <100)
TRIGLYCERIDES: 93 mg/dL (ref <150)
Total CHOL/HDL Ratio: 3.2 Ratio (ref <5.0)
VLDL: 19 mg/dL (ref <30)

## 2016-12-17 LAB — COMPREHENSIVE METABOLIC PANEL
ALBUMIN: 3.9 g/dL (ref 3.6–5.1)
ALT: 7 U/L (ref 6–29)
AST: 15 U/L (ref 10–35)
Alkaline Phosphatase: 50 U/L (ref 33–130)
BUN: 13 mg/dL (ref 7–25)
CO2: 25 mmol/L (ref 20–31)
Calcium: 9.3 mg/dL (ref 8.6–10.4)
Chloride: 105 mmol/L (ref 98–110)
Creat: 1.04 mg/dL — ABNORMAL HIGH (ref 0.60–0.88)
Glucose, Bld: 95 mg/dL (ref 65–99)
POTASSIUM: 4.2 mmol/L (ref 3.5–5.3)
Sodium: 139 mmol/L (ref 135–146)
TOTAL PROTEIN: 7.1 g/dL (ref 6.1–8.1)
Total Bilirubin: 0.6 mg/dL (ref 0.2–1.2)

## 2016-12-17 LAB — VITAMIN D 25 HYDROXY (VIT D DEFICIENCY, FRACTURES): VIT D 25 HYDROXY: 20 ng/mL — AB (ref 30–100)

## 2016-12-17 LAB — TSH: TSH: 0.91 mIU/L

## 2016-12-20 ENCOUNTER — Other Ambulatory Visit: Payer: Self-pay

## 2016-12-20 MED ORDER — VITAMIN D (ERGOCALCIFEROL) 1.25 MG (50000 UNIT) PO CAPS: 50000.0000 [IU] | ORAL_CAPSULE | ORAL | 5 refills | Status: DC

## 2017-01-03 ENCOUNTER — Emergency Department (HOSPITAL_COMMUNITY): Payer: Medicare Other

## 2017-01-03 ENCOUNTER — Encounter (HOSPITAL_COMMUNITY): Payer: Self-pay | Admitting: Emergency Medicine

## 2017-01-03 ENCOUNTER — Emergency Department (HOSPITAL_COMMUNITY)
Admission: EM | Admit: 2017-01-03 | Discharge: 2017-01-03 | Disposition: A | Discharge status: Home / Self Care | Payer: Medicare Other | Attending: Emergency Medicine | Admitting: Emergency Medicine

## 2017-01-03 ENCOUNTER — Other Ambulatory Visit: Payer: Self-pay

## 2017-01-03 DIAGNOSIS — M25561 Pain in right knee: Secondary | ICD-10-CM | POA: Insufficient documentation

## 2017-01-03 DIAGNOSIS — Y9389 Activity, other specified: Secondary | ICD-10-CM | POA: Diagnosis not present

## 2017-01-03 DIAGNOSIS — Z79899 Other long term (current) drug therapy: Secondary | ICD-10-CM | POA: Diagnosis not present

## 2017-01-03 DIAGNOSIS — Z853 Personal history of malignant neoplasm of breast: Secondary | ICD-10-CM | POA: Diagnosis not present

## 2017-01-03 DIAGNOSIS — G9389 Other specified disorders of brain: Secondary | ICD-10-CM | POA: Diagnosis not present

## 2017-01-03 DIAGNOSIS — S0990XA Unspecified injury of head, initial encounter: Secondary | ICD-10-CM | POA: Diagnosis not present

## 2017-01-03 DIAGNOSIS — I10 Essential (primary) hypertension: Secondary | ICD-10-CM | POA: Insufficient documentation

## 2017-01-03 DIAGNOSIS — R531 Weakness: Secondary | ICD-10-CM | POA: Diagnosis not present

## 2017-01-03 DIAGNOSIS — R404 Transient alteration of awareness: Secondary | ICD-10-CM | POA: Diagnosis not present

## 2017-01-03 DIAGNOSIS — S8991XA Unspecified injury of right lower leg, initial encounter: Secondary | ICD-10-CM | POA: Diagnosis present

## 2017-01-03 DIAGNOSIS — W19XXXA Unspecified fall, initial encounter: Secondary | ICD-10-CM

## 2017-01-03 DIAGNOSIS — S199XXA Unspecified injury of neck, initial encounter: Secondary | ICD-10-CM | POA: Diagnosis not present

## 2017-01-03 DIAGNOSIS — Y929 Unspecified place or not applicable: Secondary | ICD-10-CM | POA: Insufficient documentation

## 2017-01-03 DIAGNOSIS — Y990 Civilian activity done for income or pay: Secondary | ICD-10-CM | POA: Diagnosis not present

## 2017-01-03 DIAGNOSIS — Z7982 Long term (current) use of aspirin: Secondary | ICD-10-CM | POA: Insufficient documentation

## 2017-01-03 DIAGNOSIS — W010XXA Fall on same level from slipping, tripping and stumbling without subsequent striking against object, initial encounter: Secondary | ICD-10-CM | POA: Diagnosis not present

## 2017-01-03 LAB — CBC WITH DIFFERENTIAL/PLATELET
BASOS ABS: 0.1 10*3/uL (ref 0.0–0.1)
BASOS PCT: 1 %
EOS PCT: 1 %
Eosinophils Absolute: 0.1 10*3/uL (ref 0.0–0.7)
HCT: 35.3 % — ABNORMAL LOW (ref 36.0–46.0)
Hemoglobin: 11.5 g/dL — ABNORMAL LOW (ref 12.0–15.0)
Lymphocytes Relative: 18 %
Lymphs Abs: 1.6 10*3/uL (ref 0.7–4.0)
MCH: 26.1 pg (ref 26.0–34.0)
MCHC: 32.6 g/dL (ref 30.0–36.0)
MCV: 80 fL (ref 78.0–100.0)
MONO ABS: 0.5 10*3/uL (ref 0.1–1.0)
MONOS PCT: 6 %
NEUTROS ABS: 6.7 10*3/uL (ref 1.7–7.7)
Neutrophils Relative %: 74 %
PLATELETS: 303 10*3/uL (ref 150–400)
RBC: 4.41 MIL/uL (ref 3.87–5.11)
RDW: 14.3 % (ref 11.5–15.5)
WBC: 9 10*3/uL (ref 4.0–10.5)

## 2017-01-03 LAB — BASIC METABOLIC PANEL
Anion gap: 8 (ref 5–15)
BUN: 15 mg/dL (ref 6–20)
CALCIUM: 9.9 mg/dL (ref 8.9–10.3)
CO2: 25 mmol/L (ref 22–32)
CREATININE: 1.03 mg/dL — AB (ref 0.44–1.00)
Chloride: 103 mmol/L (ref 101–111)
GFR, EST AFRICAN AMERICAN: 57 mL/min — AB (ref >60)
GFR, EST NON AFRICAN AMERICAN: 49 mL/min — AB (ref >60)
GLUCOSE: 117 mg/dL — AB (ref 65–99)
Potassium: 4.1 mmol/L (ref 3.5–5.1)
Sodium: 136 mmol/L (ref 135–145)

## 2017-01-03 LAB — URINALYSIS, ROUTINE W REFLEX MICROSCOPIC
Bilirubin Urine: NEGATIVE
GLUCOSE, UA: NEGATIVE mg/dL
KETONES UR: NEGATIVE mg/dL
NITRITE: NEGATIVE
PH: 6 (ref 5.0–8.0)
PROTEIN: NEGATIVE mg/dL
Specific Gravity, Urine: 1.005 (ref 1.005–1.030)

## 2017-01-03 NOTE — Discharge Instructions (Signed)
Your blood work is reassuring, and the CT and x-rays of the head, neck and knee does not show serious injury.  Within the next 1-2 days, you should be contacted regarding home health so that we can have a visiting nurse on an physical therapy to come help you at home.  Please return without fail for worsening symptoms, including fevers, concern for serious injury, confusion, or any other symptoms concerning to you.

## 2017-01-03 NOTE — ED Notes (Signed)
Lab at bedside

## 2017-01-03 NOTE — ED Notes (Signed)
ED Provider at bedside. 

## 2017-01-03 NOTE — ED Notes (Signed)
Case management states a walker will be brought to patient before being discharged.

## 2017-01-03 NOTE — Care Management Note (Signed)
Case Management Note  Patient Details  Name: JUREA CECCARELLI MRN: [id number] Date of Birth: 19-Dec-1931  Subjective/Objective:                  CM consult received for [facility] services. Pt seen in ED. Pt lives at home with sister. Pt's sister and niece at bedside. Pt has aid that is supposed to come 5 days a week. Pt has been having multiple falls. Family/pt would like HH PT. Pt also needs RW. Pt has chosen [facility] from list of [facility] or DME providers. Blake Divine, of [facility], aware of referral and will obtain pt info from chart and deliver DME to pt's room prior to DC from ED. PT/family aware HH has 48hrs to see pt.   Action/Plan: Pt discharging home with [facility] services and RW.   Expected Discharge Date:     01/03/2017             Expected Discharge Plan:  [place]  In-House Referral:  NA  Discharge planning Services  CM Consult  Post Acute Care Choice:  Home Health, Durable Medical Equipment Choice offered to:  Patient  DME Arranged:  Walker rolling DME Agency:  [place] Arranged:  RN, PT [facility] Agency:  [place]  Status of Service:  Completed, signed off  Sherald Barge, RN 01/03/2017, 1:43 PM

## 2017-01-03 NOTE — ED Notes (Signed)
Pt requested to go to the bathroom. This nurse stated for patient to stay in bed while I went to get a bedside commode. Family,  Who is at beside, states that she doesn't need a bedside commode. I told them I would rather her use that since she was here for falls. Family member states, "I am a nurse and I can walk with her. You need to trust and believe she can walk. We don't need your bedside commode." Pt refused beside commode and walked with the nurse and family member the bathroom. NAD noted. Pt ambulatory with no difficulties.

## 2017-01-03 NOTE — ED Notes (Signed)
Case manager at bedside 

## 2017-01-03 NOTE — ED Triage Notes (Addendum)
Pt brought in EMS, pt reports several recent falls. Per EMS, pt has been favoring right leg ever since falls began "awhile ago". Pt alert and oriented. No shortening or rotation noted. Pt reports intermittent right leg pain. nad noted.

## 2017-01-03 NOTE — ED Provider Notes (Signed)
[facility] DEPT Provider Note   CSN: [id number] Arrival date & time: 01/03/17  1136  By signing my name below, I, Neta Mends, attest that this documentation has been prepared under the direction and in the presence of Forde Dandy, MD . Electronically Signed: Neta Mends, ED Scribe. 01/03/2017. 12:42 PM    History   Chief Complaint Chief Complaint  Patient presents with  . Fall   The history is provided by the patient. No language interpreter was used.   HPI Comments:  Emma Dawson is a 83 y.o. female who presents to the Emergency Department s/p fall that occurred PTA. Pt reports that she was opening a door and tripped and landed on her right side. Pt states that her legs gave out from beneath her. Pt denies head trauma or LOC. Pt states that over the past several weeks she has had several falls. Pt complains of associated intermittent right knee pain, but states that she in not in pain at this time. Pt lives alone, and states that her PCP ordered home PT for her but she states that they never followed up with her. No alleviating factors noted. Pt denies chest pain, abdominal pain, fever, vomiting, diarrhea.   Past Medical History:  Diagnosis Date  . Allergic rhinitis   . Cancer ([place]) 03/2003   radiation and chemo ND mrm  . History of breast cancer   . History of chemotherapy    And radiation secondary to breast cancer  . Hyperlipidemia   . Hypertension   . IGT (impaired glucose tolerance) 2014   DIET MANAGED  . Normocytic anemia   . Osteoarthritis   . Port catheter in place 03/05/2013    Patient Active Problem List   Diagnosis Date Noted  . Knee pain, bilateral 12/16/2016  . Need for assistance due to unsteady gait 12/16/2016  . Recurrent falls while walking 12/16/2016  . Memory loss 06/02/2015  . Personal history of noncompliance with medical treatment, presenting hazards to health 09/27/2013  . Genital herpes 05/02/2008  . CAROTID ARTERY STENOSIS,  LEFT 05/02/2008  . SYNCOPE, VASOVAGAL 05/02/2008  . CARDIAC MURMUR 05/02/2008  . Anemia 10/03/2007  . Hyperlipidemia LDL goal <100 09/27/2006  . Essential hypertension 09/27/2006  . Allergic rhinitis 09/27/2006  . Osteoarthritis 09/27/2006  . Invasive ductal carcinoma of breast ([place]) 09/27/2006    Past Surgical History:  Procedure Laterality Date  . ABDOMINAL HYSTERECTOMY  1970   Fibroid tumors, benign  . BREAST SURGERY Right 05/06/2003   MRM  . MASTECTOMY  2004   Right  . PORTACATH PLACEMENT      OB History    No data available       Home Medications    Prior to Admission medications   Medication Sig Start Date End Date Taking? Authorizing Provider  acetaminophen (TYLENOL) 500 MG tablet One tablet twice daily as needed for knee pain 05/08/12  Yes Fayrene Helper, MD  amLODipine (NORVASC) 10 MG tablet Take 1 tablet (10 mg total) by mouth daily. 07/20/16  Yes Fayrene Helper, MD  aspirin 81 MG tablet Take 81 mg by mouth daily.    Yes Historical Provider, MD  atorvastatin (LIPITOR) 40 MG tablet Take 1 tablet (40 mg total) by mouth daily. 07/20/16  Yes Fayrene Helper, MD  Docusate Calcium (STOOL SOFTENER PO) Take 1 capsule by mouth as needed.   Yes Historical Provider, MD  hydrALAZINE (APRESOLINE) 10 MG tablet Take 1 tablet (10 mg total) by mouth  at bedtime. 07/20/16  Yes Fayrene Helper, MD  losartan (COZAAR) 100 MG tablet Take 1 tablet (100 mg total) by mouth daily. 07/20/16  Yes Fayrene Helper, MD  Multiple Vitamin (MULTIVITAMIN WITH MINERALS) TABS tablet Take 1 tablet by mouth daily.   Yes Historical Provider, MD  Vitamin D, Ergocalciferol, (DRISDOL) 50000 units CAPS capsule Take 1 capsule (50,000 Units total) by mouth every 7 (seven) days. Patient taking differently: Take 50,000 Units by mouth every 7 (seven) days. Take on wednesday 12/20/16  Yes Fayrene Helper, MD    Family History Family History  Problem Relation Age of Onset  . Stroke Mother   .  Heart attack Father   . Coronary artery disease Father   . Coronary artery disease Brother   . Heart attack Sister   . Heart disease Sister   . Heart disease Sister     Social History Social History  Substance Use Topics  . Smoking status: Never Smoker  . Smokeless tobacco: Never Used  . Alcohol use No     Allergies   Aricept [donepezil hcl]   Review of Systems Review of Systems 10 systems reviewed and all are negative for acute change except as noted in the HPI.    Physical Exam Updated Vital Signs BP 135/64   Pulse 101   Temp 97.9 F (36.6 C) (Oral)   Resp 18   Ht 5\' 2"  (1.575 m)   Wt 148 lb (67.1 kg)   SpO2 100%   BMI 27.07 kg/m   Physical Exam Physical Exam  Nursing note and vitals reviewed. Constitutional: Well developed, well nourished, non-toxic, and in no acute distress Head: Normocephalic and atraumatic.  Mouth/Throat: Oropharynx is clear and moist.  Neck: Normal range of motion. Neck supple.  Cardiovascular: Normal rate and regular rhythm.   Pulmonary/Chest: Effort normal and breath sounds normal.  Abdominal: Soft. There is no tenderness. There is no rebound and no guarding.  Musculoskeletal: Normal range of motion. Mild swelling of right knee with no TTP or tenderness with ROM.  Neurological: Alert, no facial droop, fluent speech, moves all extremities symmetrically. PERRL, extraocular movementsintact, no pronator drift, bilateral lower extremities against gravity for greater than 10 seconds . Sensation to light touch intact throughout.  Skin: Skin is warm and dry.  Psychiatric: Cooperative   ED Treatments / Results  DIAGNOSTIC STUDIES:  Oxygen Saturation is 100% on RA, normal by my interpretation.    COORDINATION OF CARE:  12:37 PM Will order CT of head and xray of right knee. Family member is requesting a referral for the pt to seen Dr. Harl Bowie at [facility]. Discussed treatment plan with pt at bedside and pt agreed to plan.   Labs (all  labs ordered are listed, but only abnormal results are displayed) Labs Reviewed  URINALYSIS, ROUTINE W REFLEX MICROSCOPIC - Abnormal; Notable for the following:       Result Value   APPearance HAZY (*)    Hgb urine dipstick SMALL (*)    Leukocytes, UA LARGE (*)    Bacteria, UA RARE (*)    All other components within normal limits  CBC WITH DIFFERENTIAL/PLATELET - Abnormal; Notable for the following:    Hemoglobin 11.5 (*)    HCT 35.3 (*)    All other components within normal limits  BASIC METABOLIC PANEL - Abnormal; Notable for the following:    Glucose, Bld 117 (*)    Creatinine, Ser 1.03 (*)    GFR calc non Af Wyvonnia Lora  49 (*)    GFR calc Af Amer 57 (*)    All other components within normal limits    EKG  EKG Interpretation None       Radiology Ct Head Wo Contrast  Result Date: 01/03/2017 CLINICAL DATA:  Fall, landed on her right side, tripped opening a door EXAM: CT HEAD WITHOUT CONTRAST CT CERVICAL SPINE WITHOUT CONTRAST TECHNIQUE: Multidetector CT imaging of the head and cervical spine was performed following the standard protocol without intravenous contrast. Multiplanar CT image reconstructions of the cervical spine were also generated. COMPARISON:  08/12/2015 FINDINGS: CT HEAD FINDINGS Brain: No intracranial hemorrhage, mass effect or midline shift. No acute cortical infarction. No mass lesion is noted on this unenhanced scan. Stable cerebral atrophy. Stable periventricular chronic white matter disease. Ventricular size is stable from prior exam. Vascular: Mild atherosclerotic calcifications of carotid siphon. Skull: No skull fracture is noted. Sinuses/Orbits: No acute findings. No paranasal sinuses air-fluid levels. Other: None CT CERVICAL SPINE FINDINGS Alignment: There is reversal of cervical lordosis. Skull base and vertebrae: No acute fracture or subluxation. Degenerative changes are noted C1-C2 articulation. There is mild anterior spurring lower endplate of C4 vertebral body.  There is mild anterior and mild posterior spurring at C5-C6 and C6-C7 level. Soft tissues and spinal canal: No prevertebral soft tissue swelling. Mild spinal canal stenosis due to posterior spurring at C5-C6 and C6-C7 level. Minimal posterior spurring at C4-C5 level. Disc levels:  Mild disc space flattening at C5-6 and C6-C7 level. Upper chest: The visualized lung apices shows no evidence of pneumothorax. Extensive atherosclerotic calcifications are noted bilateral carotid bifurcation Other: None IMPRESSION: 1. No acute intracranial abnormality. Stable atrophy and chronic white matter disease. No definite acute cortical infarction. 2. No cervical spine acute fracture or subluxation. Reversal of cervical lordosis. Degenerative changes with disc space flattening and mild anterior and mild posterior spurring at C5-C6 and C6-C7 level. Mild spinal canal stenosis due to posterior spurring and minimal disc bulge at C5-C6 and C6-C7 no prevertebral soft tissue swelling. Cervical airway is patent. Electronically Signed   By: Lahoma Crocker M.D.   On: 01/03/2017 13:20   Ct Cervical Spine Wo Contrast  Result Date: 01/03/2017 CLINICAL DATA:  Fall, landed on her right side, tripped opening a door EXAM: CT HEAD WITHOUT CONTRAST CT CERVICAL SPINE WITHOUT CONTRAST TECHNIQUE: Multidetector CT imaging of the head and cervical spine was performed following the standard protocol without intravenous contrast. Multiplanar CT image reconstructions of the cervical spine were also generated. COMPARISON:  08/12/2015 FINDINGS: CT HEAD FINDINGS Brain: No intracranial hemorrhage, mass effect or midline shift. No acute cortical infarction. No mass lesion is noted on this unenhanced scan. Stable cerebral atrophy. Stable periventricular chronic white matter disease. Ventricular size is stable from prior exam. Vascular: Mild atherosclerotic calcifications of carotid siphon. Skull: No skull fracture is noted. Sinuses/Orbits: No acute findings. No  paranasal sinuses air-fluid levels. Other: None CT CERVICAL SPINE FINDINGS Alignment: There is reversal of cervical lordosis. Skull base and vertebrae: No acute fracture or subluxation. Degenerative changes are noted C1-C2 articulation. There is mild anterior spurring lower endplate of C4 vertebral body. There is mild anterior and mild posterior spurring at C5-C6 and C6-C7 level. Soft tissues and spinal canal: No prevertebral soft tissue swelling. Mild spinal canal stenosis due to posterior spurring at C5-C6 and C6-C7 level. Minimal posterior spurring at C4-C5 level. Disc levels:  Mild disc space flattening at C5-6 and C6-C7 level. Upper chest: The visualized lung apices shows no evidence of  pneumothorax. Extensive atherosclerotic calcifications are noted bilateral carotid bifurcation Other: None IMPRESSION: 1. No acute intracranial abnormality. Stable atrophy and chronic white matter disease. No definite acute cortical infarction. 2. No cervical spine acute fracture or subluxation. Reversal of cervical lordosis. Degenerative changes with disc space flattening and mild anterior and mild posterior spurring at C5-C6 and C6-C7 level. Mild spinal canal stenosis due to posterior spurring and minimal disc bulge at C5-C6 and C6-C7 no prevertebral soft tissue swelling. Cervical airway is patent. Electronically Signed   By: Lahoma Crocker M.D.   On: 01/03/2017 13:20   Dg Knee Complete 4 Views Right  Result Date: 01/03/2017 CLINICAL DATA:  Recent fall with right knee pain, initial encounter EXAM: RIGHT KNEE - COMPLETE 4+ VIEW COMPARISON:  12/16/2016 FINDINGS: No evidence of fracture, dislocation, or joint effusion. No evidence of arthropathy or other focal bone abnormality. Soft tissues are unremarkable. IMPRESSION: No acute abnormality noted. Electronically Signed   By: Inez Catalina M.D.   On: 01/03/2017 13:19    Procedures Procedures (including critical care time)  Medications Ordered in ED Medications - No data to  display   Initial Impression / Assessment and Plan / ED Course  I have reviewed the triage vital signs and the nursing notes.  Pertinent labs & imaging results that were available during my care of the patient were reviewed by me and considered in my medical decision making (see chart for details).     83 year old female who presents after mechanical fall. She is well-appearing and in no acute distress. Mentating normally. Without any complaints of pain or signs or symptoms of severe injuries. Has no focal neurological deficits.  CT head and cervical spine visualized and shows no traumatic injuries of the head or neck. X-ray of the right knee also shows no acute traumatic injuries. Discussed frequent falls with case management. They will set her up with home health as well as walker provided for gait instability. She was able to ambulate without difficulty to the bathroom here.  Stable for discharge home. Strict return and follow-up instructions reviewed. She expressed understanding of all discharge instructions and felt comfortable with the plan of care.     Final Clinical Impressions(s) / ED Diagnoses   Final diagnoses:  Fall, initial encounter    New Prescriptions New Prescriptions   No medications on file   I personally performed the services described in this documentation, which was scribed in my presence. The recorded information has been reviewed and is accurate.    Forde Dandy, MD 01/03/17 1425

## 2017-01-04 ENCOUNTER — Telehealth: Payer: Self-pay | Admitting: Family Medicine

## 2017-01-04 DIAGNOSIS — D649 Anemia, unspecified: Secondary | ICD-10-CM | POA: Diagnosis not present

## 2017-01-04 DIAGNOSIS — M25561 Pain in right knee: Secondary | ICD-10-CM | POA: Diagnosis not present

## 2017-01-04 DIAGNOSIS — R296 Repeated falls: Secondary | ICD-10-CM | POA: Diagnosis not present

## 2017-01-04 DIAGNOSIS — M25562 Pain in left knee: Secondary | ICD-10-CM | POA: Diagnosis not present

## 2017-01-04 DIAGNOSIS — Z9181 History of falling: Secondary | ICD-10-CM | POA: Diagnosis not present

## 2017-01-04 DIAGNOSIS — E785 Hyperlipidemia, unspecified: Secondary | ICD-10-CM | POA: Diagnosis not present

## 2017-01-04 DIAGNOSIS — I10 Essential (primary) hypertension: Secondary | ICD-10-CM | POA: Diagnosis not present

## 2017-01-04 DIAGNOSIS — R269 Unspecified abnormalities of gait and mobility: Secondary | ICD-10-CM | POA: Diagnosis not present

## 2017-01-04 NOTE — Telephone Encounter (Signed)
pls f/u with HOME PT for this pt, she was referred,( or shold have been) no one has been there, and she was in ED yesterday having fallen at home, needs PT asap pls,  ?? pls ask

## 2017-01-06 ENCOUNTER — Other Ambulatory Visit: Payer: Self-pay | Admitting: Family Medicine

## 2017-01-06 DIAGNOSIS — M1991 Primary osteoarthritis, unspecified site: Secondary | ICD-10-CM | POA: Diagnosis not present

## 2017-01-06 DIAGNOSIS — Z853 Personal history of malignant neoplasm of breast: Secondary | ICD-10-CM | POA: Diagnosis not present

## 2017-01-06 DIAGNOSIS — Z9011 Acquired absence of right breast and nipple: Secondary | ICD-10-CM | POA: Diagnosis not present

## 2017-01-06 DIAGNOSIS — Z923 Personal history of irradiation: Secondary | ICD-10-CM | POA: Diagnosis not present

## 2017-01-06 DIAGNOSIS — Z9181 History of falling: Secondary | ICD-10-CM | POA: Diagnosis not present

## 2017-01-06 DIAGNOSIS — Z9221 Personal history of antineoplastic chemotherapy: Secondary | ICD-10-CM | POA: Diagnosis not present

## 2017-01-06 DIAGNOSIS — I6522 Occlusion and stenosis of left carotid artery: Secondary | ICD-10-CM | POA: Diagnosis not present

## 2017-01-06 DIAGNOSIS — I10 Essential (primary) hypertension: Secondary | ICD-10-CM | POA: Diagnosis not present

## 2017-01-06 DIAGNOSIS — M25562 Pain in left knee: Secondary | ICD-10-CM | POA: Diagnosis not present

## 2017-01-06 DIAGNOSIS — M25561 Pain in right knee: Secondary | ICD-10-CM | POA: Diagnosis not present

## 2017-01-06 DIAGNOSIS — D649 Anemia, unspecified: Secondary | ICD-10-CM | POA: Diagnosis not present

## 2017-01-06 NOTE — Telephone Encounter (Signed)
[facility] said they received the referral and they are going to see her

## 2017-01-10 DIAGNOSIS — Z9181 History of falling: Secondary | ICD-10-CM | POA: Diagnosis not present

## 2017-01-10 DIAGNOSIS — I10 Essential (primary) hypertension: Secondary | ICD-10-CM | POA: Diagnosis not present

## 2017-01-10 DIAGNOSIS — I6522 Occlusion and stenosis of left carotid artery: Secondary | ICD-10-CM | POA: Diagnosis not present

## 2017-01-10 DIAGNOSIS — D649 Anemia, unspecified: Secondary | ICD-10-CM | POA: Diagnosis not present

## 2017-01-10 DIAGNOSIS — M25561 Pain in right knee: Secondary | ICD-10-CM | POA: Diagnosis not present

## 2017-01-10 DIAGNOSIS — M25562 Pain in left knee: Secondary | ICD-10-CM | POA: Diagnosis not present

## 2017-01-11 DIAGNOSIS — Z9181 History of falling: Secondary | ICD-10-CM | POA: Diagnosis not present

## 2017-01-11 DIAGNOSIS — I10 Essential (primary) hypertension: Secondary | ICD-10-CM | POA: Diagnosis not present

## 2017-01-11 DIAGNOSIS — M25562 Pain in left knee: Secondary | ICD-10-CM | POA: Diagnosis not present

## 2017-01-11 DIAGNOSIS — M25561 Pain in right knee: Secondary | ICD-10-CM | POA: Diagnosis not present

## 2017-01-11 DIAGNOSIS — D649 Anemia, unspecified: Secondary | ICD-10-CM | POA: Diagnosis not present

## 2017-01-11 DIAGNOSIS — I6522 Occlusion and stenosis of left carotid artery: Secondary | ICD-10-CM | POA: Diagnosis not present

## 2017-01-13 DIAGNOSIS — D649 Anemia, unspecified: Secondary | ICD-10-CM | POA: Diagnosis not present

## 2017-01-13 DIAGNOSIS — M25561 Pain in right knee: Secondary | ICD-10-CM | POA: Diagnosis not present

## 2017-01-13 DIAGNOSIS — I10 Essential (primary) hypertension: Secondary | ICD-10-CM | POA: Diagnosis not present

## 2017-01-13 DIAGNOSIS — Z9181 History of falling: Secondary | ICD-10-CM | POA: Diagnosis not present

## 2017-01-13 DIAGNOSIS — M25562 Pain in left knee: Secondary | ICD-10-CM | POA: Diagnosis not present

## 2017-01-13 DIAGNOSIS — I6522 Occlusion and stenosis of left carotid artery: Secondary | ICD-10-CM | POA: Diagnosis not present

## 2017-01-16 ENCOUNTER — Encounter: Payer: Self-pay | Admitting: Family Medicine

## 2017-01-16 ENCOUNTER — Telehealth: Payer: Self-pay | Admitting: Family Medicine

## 2017-01-16 ENCOUNTER — Ambulatory Visit: Payer: Medicare Other | Admitting: Family Medicine

## 2017-01-16 ENCOUNTER — Ambulatory Visit (INDEPENDENT_AMBULATORY_CARE_PROVIDER_SITE_OTHER): Payer: Medicare Other | Admitting: Family Medicine

## 2017-01-16 VITALS — BP 136/52 | HR 88 | Temp 98.2°F | Resp 18 | Ht 62.0 in | Wt 146.0 lb

## 2017-01-16 DIAGNOSIS — M25561 Pain in right knee: Secondary | ICD-10-CM

## 2017-01-16 DIAGNOSIS — K5909 Other constipation: Secondary | ICD-10-CM

## 2017-01-16 DIAGNOSIS — E559 Vitamin D deficiency, unspecified: Secondary | ICD-10-CM | POA: Diagnosis not present

## 2017-01-16 DIAGNOSIS — I10 Essential (primary) hypertension: Secondary | ICD-10-CM | POA: Diagnosis not present

## 2017-01-16 DIAGNOSIS — R296 Repeated falls: Secondary | ICD-10-CM

## 2017-01-16 DIAGNOSIS — M25562 Pain in left knee: Secondary | ICD-10-CM | POA: Diagnosis not present

## 2017-01-16 DIAGNOSIS — G8929 Other chronic pain: Secondary | ICD-10-CM | POA: Diagnosis not present

## 2017-01-16 DIAGNOSIS — D649 Anemia, unspecified: Secondary | ICD-10-CM | POA: Diagnosis not present

## 2017-01-16 DIAGNOSIS — R269 Unspecified abnormalities of gait and mobility: Secondary | ICD-10-CM | POA: Diagnosis not present

## 2017-01-16 DIAGNOSIS — G20 Parkinson's disease: Secondary | ICD-10-CM

## 2017-01-16 DIAGNOSIS — Z9181 History of falling: Secondary | ICD-10-CM | POA: Diagnosis not present

## 2017-01-16 DIAGNOSIS — E785 Hyperlipidemia, unspecified: Secondary | ICD-10-CM | POA: Diagnosis not present

## 2017-01-16 NOTE — Patient Instructions (Addendum)
May take aleve (naproxen)  twice a day with food Take this as needed for knee pain and swelling  Do your exercise every day  STOP the hydralazine  I will place orders for bedside commode and hand rail for the bathroom  See Dr Moshe Cipro in March  I have placed a referral to neurology for evaluation

## 2017-01-16 NOTE — Telephone Encounter (Signed)
Niece [name] in asking for hearing, she is concerned that her cousin, Emma Dawson, is not providing the necessary care deipite being reimbursed , states Aunt continually falls, has inadequate supervision and she is scared for her, states concerned she has Parkinson's, shuffle gait, Advanced currently seeing her I have advised Tonya to arrange visit in office with Madeline Roley and her cousin who is respsonsible for her care present

## 2017-01-16 NOTE — Progress Notes (Signed)
Chief Complaint  Patient presents with  . Follow-up    [facility] ER s/p fall   Patient brought in by her niece Lorenso Courier. She is concerned about Ms. Naeem and her declining health. She has had frequent falls. She had another follow-up emergency room visit this month. She appears to be generally weak. The family worries she might have Parkinson's disease. The patient seems to think her falls are related to knee pain, balance, and environmental factors. She denies any dizziness, lightheadedness or faint, she denies any rapid heartbeat or palpitations. She states she is eating normally. She states that she is sleeping well. She feels like she has enough help in the home. Her weight is fairly stable, she's lost 4 pounds in the last year. She had blood work x-rays and an EKG in the emergency room, they were noncontributory. The niece wants all of her medicines reviewed to decide if they are essential. She feels she may be on too many medication. On review I do feel like we can stop her hydralazine since she only takes 10 mg at bedtime. She has an appointment to come back within the next 2 weeks with her usual primary care provider to follow up on the blood pressure. She also requests referral to neurology. This is placed. We discussed that she was identified as being vitamin D deficient in January of this year. She is taking her vitamin D supplement once a week She complains of chronic constipation. We discussed the basics of adequate fluid intake, high fiber diet, mobility, and fiber supplements. Occasional use of stool softener is safe and appropriate.   Patient Active Problem List   Diagnosis Date Noted  . Knee pain, bilateral 12/16/2016  . Need for assistance due to unsteady gait 12/16/2016  . Recurrent falls while walking 12/16/2016  . Memory loss 06/02/2015  . Personal history of noncompliance with medical treatment, presenting hazards to health 09/27/2013  . Genital herpes 05/02/2008  .  CAROTID ARTERY STENOSIS, LEFT 05/02/2008  . SYNCOPE, VASOVAGAL 05/02/2008  . CARDIAC MURMUR 05/02/2008  . Anemia 10/03/2007  . Hyperlipidemia LDL goal <100 09/27/2006  . Essential hypertension 09/27/2006  . Allergic rhinitis 09/27/2006  . Osteoarthritis 09/27/2006  . Invasive ductal carcinoma of breast ([place]) 09/27/2006    Outpatient Encounter Prescriptions as of 01/16/2017  Medication Sig  . acetaminophen (TYLENOL) 500 MG tablet One tablet twice daily as needed for knee pain  . amLODipine (NORVASC) 10 MG tablet Take 1 tablet (10 mg total) by mouth daily.  Marland Kitchen aspirin 81 MG tablet Take 81 mg by mouth daily.   Marland Kitchen atorvastatin (LIPITOR) 40 MG tablet Take 1 tablet (40 mg total) by mouth daily.  Mariane Baumgarten Calcium (STOOL SOFTENER PO) Take 1 capsule by mouth as needed.  Marland Kitchen losartan (COZAAR) 100 MG tablet Take 1 tablet (100 mg total) by mouth daily.  . Multiple Vitamin (MULTIVITAMIN WITH MINERALS) TABS tablet Take 1 tablet by mouth daily.  . Vitamin D, Ergocalciferol, (DRISDOL) 50000 units CAPS capsule Take 1 capsule (50,000 Units total) by mouth every 7 (seven) days. (Patient taking differently: Take 50,000 Units by mouth every 7 (seven) days. Take on wednesday)   Facility-Administered Encounter Medications as of 01/16/2017  Medication  . sodium chloride 0.9 % injection 10 mL    Allergies  Allergen Reactions  . Aricept [Donepezil Hcl]     Hair loss     Review of Systems  Constitutional: Negative for activity change, appetite change, fatigue and unexpected weight change.  HENT: Negative for congestion, dental problem and trouble swallowing.   Eyes: Negative for discharge and visual disturbance.  Respiratory: Negative for cough and shortness of breath.   Cardiovascular: Negative for chest pain, palpitations and leg swelling.  Gastrointestinal: Positive for constipation. Negative for blood in stool.  Genitourinary: Negative.  Negative for difficulty urinating.  Musculoskeletal: Positive for  arthralgias, gait problem and joint swelling. Negative for back pain.       Pain and swelling in right knee  Skin: Negative.   Neurological: Positive for weakness. Negative for dizziness and light-headedness.  Psychiatric/Behavioral: Negative for dysphoric mood and sleep disturbance. The patient is not nervous/anxious.     BP (!) 136/52 (BP Location: Left Arm, Patient Position: Sitting, Cuff Size: Normal)   Pulse 88   Temp 98.2 F (36.8 C) (Temporal)   Resp 18   Ht 5\' 2"  (1.575 m)   Wt 146 lb (66.2 kg)   SpO2 97%   BMI 26.70 kg/m   Physical Exam  Constitutional: She is oriented to person, place, and time. She appears well-developed and well-nourished. No distress.  HENT:  Head: Normocephalic and atraumatic.  Mouth/Throat: Oropharynx is clear and moist.  Eyes: Pupils are equal, round, and reactive to light.  Neck: Normal range of motion.  No bruit  Cardiovascular: Normal rate and regular rhythm.   Murmur heard. Pulmonary/Chest: Effort normal and breath sounds normal. She has no wheezes.  Abdominal: Soft. Bowel sounds are normal. There is no tenderness.  Musculoskeletal: Normal range of motion. She exhibits no edema.  Normal strength sensation and range of motion and reflexes. No pedal edema. No cogwheeling. No tremor. Patient has a slow gait, cautious. Slightly favors right leg.  Lymphadenopathy:    She has no cervical adenopathy.  Neurological: She is alert and oriented to person, place, and time.  Psychiatric: She has a normal mood and affect. Her behavior is normal.    ASSESSMENT/PLAN:  1. Recurrent falls while walking I explained to the niece that I feel recurring falls or just from general debility, weakness, and the arthritis in her knee. I see no sign of neurologic disease, focal weakness, Parkinson's syndrome. - Ambulatory referral to Neurology  2. Chronic pain of both knees Currently right knee is more painful than the left. She does have some warmth and  effusion.  3. Vitamin D deficiency Replaced  4. Essential hypertension Controlled 5. Chronic constipation As above, discussed   Patient Instructions  May take aleve (naproxen)  twice a day with food Take this as needed for knee pain and swelling  Do your exercise every day  STOP the hydralazine  I will place orders for bedside commode and hand rail for the bathroom  See Dr Moshe Cipro in March  I have placed a referral to neurology for evaluation     Raylene Everts, MD

## 2017-01-17 ENCOUNTER — Other Ambulatory Visit: Payer: Self-pay | Admitting: Family Medicine

## 2017-01-17 DIAGNOSIS — M25562 Pain in left knee: Secondary | ICD-10-CM | POA: Diagnosis not present

## 2017-01-17 DIAGNOSIS — D649 Anemia, unspecified: Secondary | ICD-10-CM | POA: Diagnosis not present

## 2017-01-17 DIAGNOSIS — I10 Essential (primary) hypertension: Secondary | ICD-10-CM | POA: Diagnosis not present

## 2017-01-17 DIAGNOSIS — I6522 Occlusion and stenosis of left carotid artery: Secondary | ICD-10-CM | POA: Diagnosis not present

## 2017-01-17 DIAGNOSIS — M25561 Pain in right knee: Secondary | ICD-10-CM | POA: Diagnosis not present

## 2017-01-17 DIAGNOSIS — Z9181 History of falling: Secondary | ICD-10-CM | POA: Diagnosis not present

## 2017-01-18 DIAGNOSIS — I10 Essential (primary) hypertension: Secondary | ICD-10-CM | POA: Diagnosis not present

## 2017-01-18 DIAGNOSIS — M25562 Pain in left knee: Secondary | ICD-10-CM | POA: Diagnosis not present

## 2017-01-18 DIAGNOSIS — M25561 Pain in right knee: Secondary | ICD-10-CM | POA: Diagnosis not present

## 2017-01-18 DIAGNOSIS — Z9181 History of falling: Secondary | ICD-10-CM | POA: Diagnosis not present

## 2017-01-18 DIAGNOSIS — D649 Anemia, unspecified: Secondary | ICD-10-CM | POA: Diagnosis not present

## 2017-01-18 DIAGNOSIS — I6522 Occlusion and stenosis of left carotid artery: Secondary | ICD-10-CM | POA: Diagnosis not present

## 2017-01-19 DIAGNOSIS — M25561 Pain in right knee: Secondary | ICD-10-CM | POA: Diagnosis not present

## 2017-01-19 DIAGNOSIS — D649 Anemia, unspecified: Secondary | ICD-10-CM | POA: Diagnosis not present

## 2017-01-19 DIAGNOSIS — M25562 Pain in left knee: Secondary | ICD-10-CM | POA: Diagnosis not present

## 2017-01-19 DIAGNOSIS — I6522 Occlusion and stenosis of left carotid artery: Secondary | ICD-10-CM | POA: Diagnosis not present

## 2017-01-19 DIAGNOSIS — I10 Essential (primary) hypertension: Secondary | ICD-10-CM | POA: Diagnosis not present

## 2017-01-19 DIAGNOSIS — Z9181 History of falling: Secondary | ICD-10-CM | POA: Diagnosis not present

## 2017-01-23 ENCOUNTER — Emergency Department (HOSPITAL_COMMUNITY): Payer: Medicare Other

## 2017-01-23 ENCOUNTER — Emergency Department (HOSPITAL_COMMUNITY)
Admission: EM | Admit: 2017-01-23 | Discharge: 2017-01-23 | Disposition: A | Discharge status: Home / Self Care | Payer: Medicare Other | Attending: Emergency Medicine | Admitting: Emergency Medicine

## 2017-01-23 ENCOUNTER — Encounter (HOSPITAL_COMMUNITY): Payer: Self-pay

## 2017-01-23 DIAGNOSIS — S0590XA Unspecified injury of unspecified eye and orbit, initial encounter: Secondary | ICD-10-CM | POA: Diagnosis not present

## 2017-01-23 DIAGNOSIS — Y999 Unspecified external cause status: Secondary | ICD-10-CM | POA: Diagnosis not present

## 2017-01-23 DIAGNOSIS — I10 Essential (primary) hypertension: Secondary | ICD-10-CM | POA: Diagnosis not present

## 2017-01-23 DIAGNOSIS — S299XXA Unspecified injury of thorax, initial encounter: Secondary | ICD-10-CM | POA: Diagnosis not present

## 2017-01-23 DIAGNOSIS — W0110XA Fall on same level from slipping, tripping and stumbling with subsequent striking against unspecified object, initial encounter: Secondary | ICD-10-CM | POA: Diagnosis not present

## 2017-01-23 DIAGNOSIS — Z853 Personal history of malignant neoplasm of breast: Secondary | ICD-10-CM | POA: Insufficient documentation

## 2017-01-23 DIAGNOSIS — M25561 Pain in right knee: Secondary | ICD-10-CM | POA: Insufficient documentation

## 2017-01-23 DIAGNOSIS — Z79899 Other long term (current) drug therapy: Secondary | ICD-10-CM | POA: Insufficient documentation

## 2017-01-23 DIAGNOSIS — R531 Weakness: Secondary | ICD-10-CM | POA: Diagnosis not present

## 2017-01-23 DIAGNOSIS — Y929 Unspecified place or not applicable: Secondary | ICD-10-CM | POA: Insufficient documentation

## 2017-01-23 DIAGNOSIS — E871 Hypo-osmolality and hyponatremia: Secondary | ICD-10-CM | POA: Insufficient documentation

## 2017-01-23 DIAGNOSIS — S0083XA Contusion of other part of head, initial encounter: Secondary | ICD-10-CM | POA: Insufficient documentation

## 2017-01-23 DIAGNOSIS — S0990XA Unspecified injury of head, initial encounter: Secondary | ICD-10-CM | POA: Diagnosis present

## 2017-01-23 DIAGNOSIS — Z7982 Long term (current) use of aspirin: Secondary | ICD-10-CM | POA: Diagnosis not present

## 2017-01-23 DIAGNOSIS — S098XXA Other specified injuries of head, initial encounter: Secondary | ICD-10-CM | POA: Diagnosis not present

## 2017-01-23 DIAGNOSIS — Y939 Activity, unspecified: Secondary | ICD-10-CM | POA: Insufficient documentation

## 2017-01-23 DIAGNOSIS — W19XXXA Unspecified fall, initial encounter: Secondary | ICD-10-CM

## 2017-01-23 LAB — URINALYSIS, ROUTINE W REFLEX MICROSCOPIC
BILIRUBIN URINE: NEGATIVE
Glucose, UA: NEGATIVE mg/dL
HGB URINE DIPSTICK: NEGATIVE
Ketones, ur: NEGATIVE mg/dL
NITRITE: NEGATIVE
PROTEIN: NEGATIVE mg/dL
Specific Gravity, Urine: 1.017 (ref 1.005–1.030)
pH: 5 (ref 5.0–8.0)

## 2017-01-23 LAB — CBC WITH DIFFERENTIAL/PLATELET
BASOS ABS: 0.1 10*3/uL (ref 0.0–0.1)
Basophils Relative: 1 %
EOS ABS: 0.2 10*3/uL (ref 0.0–0.7)
EOS PCT: 2 %
HCT: 32.3 % — ABNORMAL LOW (ref 36.0–46.0)
Hemoglobin: 10.4 g/dL — ABNORMAL LOW (ref 12.0–15.0)
Lymphocytes Relative: 18 %
Lymphs Abs: 1.8 10*3/uL (ref 0.7–4.0)
MCH: 25.7 pg — ABNORMAL LOW (ref 26.0–34.0)
MCHC: 32.2 g/dL (ref 30.0–36.0)
MCV: 80 fL (ref 78.0–100.0)
Monocytes Absolute: 0.6 10*3/uL (ref 0.1–1.0)
Monocytes Relative: 6 %
Neutro Abs: 7.4 10*3/uL (ref 1.7–7.7)
Neutrophils Relative %: 73 %
PLATELETS: 313 10*3/uL (ref 150–400)
RBC: 4.04 MIL/uL (ref 3.87–5.11)
RDW: 14.5 % (ref 11.5–15.5)
WBC: 10.1 10*3/uL (ref 4.0–10.5)

## 2017-01-23 LAB — BASIC METABOLIC PANEL
Anion gap: 6 (ref 5–15)
BUN: 18 mg/dL (ref 6–20)
CALCIUM: 9.4 mg/dL (ref 8.9–10.3)
CO2: 25 mmol/L (ref 22–32)
CREATININE: 1.01 mg/dL — AB (ref 0.44–1.00)
Chloride: 102 mmol/L (ref 101–111)
GFR calc Af Amer: 58 mL/min — ABNORMAL LOW (ref >60)
GFR, EST NON AFRICAN AMERICAN: 50 mL/min — AB (ref >60)
GLUCOSE: 106 mg/dL — AB (ref 65–99)
POTASSIUM: 4 mmol/L (ref 3.5–5.1)
SODIUM: 133 mmol/L — AB (ref 135–145)

## 2017-01-23 NOTE — ED Provider Notes (Signed)
[facility] DEPT Provider Note   CSN: [id number] Arrival date & time: 01/23/17  1704     History   Chief Complaint Chief Complaint  Patient presents with  . Fall    HPI Emma Dawson is a 83 y.o. female.  HPI Patient states that she tripped over her rug and fell hitting her left forehead. No loss conscious. States she has chronic pain in her right knee but is really no different than normal. No neck pain. No headache now. She is not on anticoagulation however she is on aspirin. She lives alone but has family members around her.   Past Medical History:  Diagnosis Date  . Allergic rhinitis   . Cancer ([place]) 03/2003   radiation and chemo ND mrm  . History of breast cancer   . History of chemotherapy    And radiation secondary to breast cancer  . Hyperlipidemia   . Hypertension   . IGT (impaired glucose tolerance) 2014   DIET MANAGED  . Normocytic anemia   . Osteoarthritis   . Port catheter in place 03/05/2013    Patient Active Problem List   Diagnosis Date Noted  . Knee pain, bilateral 12/16/2016  . Need for assistance due to unsteady gait 12/16/2016  . Recurrent falls while walking 12/16/2016  . Memory loss 06/02/2015  . Personal history of noncompliance with medical treatment, presenting hazards to health 09/27/2013  . Genital herpes 05/02/2008  . CAROTID ARTERY STENOSIS, LEFT 05/02/2008  . SYNCOPE, VASOVAGAL 05/02/2008  . CARDIAC MURMUR 05/02/2008  . Anemia 10/03/2007  . Hyperlipidemia LDL goal <100 09/27/2006  . Essential hypertension 09/27/2006  . Allergic rhinitis 09/27/2006  . Osteoarthritis 09/27/2006  . Invasive ductal carcinoma of breast ([place]) 09/27/2006    Past Surgical History:  Procedure Laterality Date  . ABDOMINAL HYSTERECTOMY  1970   Fibroid tumors, benign  . BREAST SURGERY Right 05/06/2003   MRM  . MASTECTOMY  2004   Right  . PORTACATH PLACEMENT      OB History    No data available       Home Medications    Prior to Admission  medications   Medication Sig Start Date End Date Taking? Authorizing Provider  acetaminophen (TYLENOL) 500 MG tablet One tablet twice daily as needed for knee pain Patient taking differently: Take 500 mg by mouth every 8 (eight) hours as needed for mild pain or moderate pain.  05/08/12  Yes Fayrene Helper, MD  amLODipine (NORVASC) 10 MG tablet TAKE 1 TABLET BY MOUTH DAILY. 01/17/17  Yes Fayrene Helper, MD  aspirin 81 MG tablet Take 81 mg by mouth daily.    Yes Historical Provider, MD  docusate sodium (COLACE) 100 MG capsule Take 100 mg by mouth daily as needed for mild constipation.   Yes Historical Provider, MD  losartan (COZAAR) 100 MG tablet TAKE 1 TABLET BY MOUTH ONCE A DAY. 01/17/17  Yes Fayrene Helper, MD  Multiple Vitamin (MULTIVITAMIN WITH MINERALS) TABS tablet Take 1 tablet by mouth daily.   Yes Historical Provider, MD  simvastatin (ZOCOR) 40 MG tablet Take 40 mg by mouth daily.   Yes Historical Provider, MD  Vitamin D, Ergocalciferol, (DRISDOL) 50000 units CAPS capsule Take 1 capsule (50,000 Units total) by mouth every 7 (seven) days. Patient taking differently: Take 50,000 Units by mouth every 7 (seven) days. Take on wednesday 12/20/16  Yes Fayrene Helper, MD    Family History Family History  Problem Relation Age of Onset  . Stroke  Mother   . Heart attack Father   . Coronary artery disease Father   . Coronary artery disease Brother   . Heart attack Sister   . Heart disease Sister   . Heart disease Sister     Social History Social History  Substance Use Topics  . Smoking status: Never Smoker  . Smokeless tobacco: Never Used  . Alcohol use No     Allergies   Aricept [donepezil hcl]   Review of Systems Review of Systems  Constitutional: Negative for appetite change.  HENT: Negative for dental problem.   Respiratory: Negative for shortness of breath.   Cardiovascular: Negative for chest pain.  Genitourinary: Negative for enuresis.  Musculoskeletal:  Negative for gait problem.       Right knee pain  Skin: Negative for wound.  Neurological: Negative for numbness and headaches.  Hematological: Negative for adenopathy.     Physical Exam Updated Vital Signs BP 134/67   Pulse 77   Temp 98.4 F (36.9 C) (Oral)   Resp 16   Ht 5\' 2"  (1.575 m)   Wt 146 lb (66.2 kg)   SpO2 97%   BMI 26.70 kg/m   Physical Exam  Constitutional: She appears well-developed.  HENT:  Hematoma to left anterior forehead. No underlying bony tenderness.  Eyes: EOM are normal.  Neck:  No midline cervical tenderness with good painless range of motion of the neck.  Cardiovascular: Normal rate.   Pulmonary/Chest: Effort normal. She exhibits no tenderness.  Abdominal: Soft. There is no tenderness.  Musculoskeletal: She exhibits tenderness.  Mild tenderness over right knee. No point tenderness. No other tenderness on upper or lower extremities.  Skin: Skin is warm. Capillary refill takes less than 2 seconds.  Psychiatric: She has a normal mood and affect.     ED Treatments / Results  Labs (all labs ordered are listed, but only abnormal results are displayed) Labs Reviewed  URINALYSIS, ROUTINE W REFLEX MICROSCOPIC - Abnormal; Notable for the following:       Result Value   APPearance HAZY (*)    Leukocytes, UA LARGE (*)    Bacteria, UA RARE (*)    All other components within normal limits  BASIC METABOLIC PANEL - Abnormal; Notable for the following:    Sodium 133 (*)    Glucose, Bld 106 (*)    Creatinine, Ser 1.01 (*)    GFR calc non Af Amer 50 (*)    GFR calc Af Amer 58 (*)    All other components within normal limits  CBC WITH DIFFERENTIAL/PLATELET - Abnormal; Notable for the following:    Hemoglobin 10.4 (*)    HCT 32.3 (*)    MCH 25.7 (*)    All other components within normal limits  URINE CULTURE    EKG  EKG Interpretation None       Radiology Dg Chest 2 View  Result Date: 01/23/2017 CLINICAL DATA:  Status post fall.  Weakness.  EXAM: CHEST  2 VIEW COMPARISON:  04/10/2010 FINDINGS: There is no focal parenchymal opacity. There is no pleural effusion or pneumothorax. There is stable cardiomegaly. Left-sided Port-A-Cath in unchanged position. There is thoracic aortic atherosclerosis. The osseous structures are unremarkable. IMPRESSION: No active cardiopulmonary disease. Electronically Signed   By: Kathreen Devoid   On: 01/23/2017 19:06   Ct Head Wo Contrast  Result Date: 01/23/2017 CLINICAL DATA:  Tripped on rug, struck LEFT forehead today. No loss of consciousness. History of breast cancer, hyperlipidemia, hypertension. EXAM: CT HEAD WITHOUT  CONTRAST TECHNIQUE: Contiguous axial images were obtained from the base of the skull through the vertex without intravenous contrast. COMPARISON:  CT HEAD January 03, 2017 FINDINGS: BRAIN: No intraparenchymal hemorrhage, mass effect nor midline shift. Moderate to severe ventriculomegaly on the basis of global parenchymal brain volume loss. Patchy supratentorial white matter hypodensities within normal range for patient's age, though non-specific are most compatible with chronic small vessel ischemic disease. No acute large vascular territory infarcts. No abnormal extra-axial fluid collections. Basal cisterns are patent. VASCULAR: Moderate to severe calcific atherosclerosis of the carotid siphons. SKULL: No skull fracture. Moderate LEFT frontal scalp hematoma without subcutaneous gas or radiopaque foreign bodies. SINUSES/ORBITS: The mastoid air-cells and included paranasal sinuses are well-aerated.The included ocular globes and orbital contents are non-suspicious. OTHER: None. IMPRESSION: No acute intracranial process. Moderate LEFT frontal scalp hematoma. No skull fracture. Otherwise stable examination: Moderate to severe atrophy and moderate chronic small vessel ischemic disease. Electronically Signed   By: Elon Alas M.D.   On: 01/23/2017 19:11    Procedures Procedures (including critical  care time)  Medications Ordered in ED Medications - No data to display   Initial Impression / Assessment and Plan / ED Course  I have reviewed the triage vital signs and the nursing notes.  Pertinent labs & imaging results that were available during my care of the patient were reviewed by me and considered in my medical decision making (see chart for details).     Patient with fall. Patient states she just tripped and fell which is not unusual for her. Family members later arrive at 7 she's been more prone to falls her last 2 months. May have had some confusion with that too. States it worried because the patient did not know whether she had her head on the floor or the dresser when she fell. No change in medicines. May have had a mild decreased oral intake. Will get lab work to evaluate for increased falls  Labs overall reassuring. Mild hyponatremia at 133. Does have some white cells in the urine culture sent but will not empirically treat. Will discharge home.  Final Clinical Impressions(s) / ED Diagnoses   Final diagnoses:  Fall, initial encounter  Injury of head, initial encounter    New Prescriptions New Prescriptions   No medications on file     Davonna Belling, MD 01/23/17 2010

## 2017-01-23 NOTE — ED Notes (Signed)
Pt transported to CT and x-ray  

## 2017-01-23 NOTE — ED Triage Notes (Signed)
Pt brought in by EMS. Pt is alert and oriented and reports tripping on rug and hitting left forehead. Pt currently on asa. EMS reports that pt able to recall events of fall. Denies LOC

## 2017-01-24 DIAGNOSIS — M25561 Pain in right knee: Secondary | ICD-10-CM | POA: Diagnosis not present

## 2017-01-24 DIAGNOSIS — I10 Essential (primary) hypertension: Secondary | ICD-10-CM | POA: Diagnosis not present

## 2017-01-24 DIAGNOSIS — I6522 Occlusion and stenosis of left carotid artery: Secondary | ICD-10-CM | POA: Diagnosis not present

## 2017-01-24 DIAGNOSIS — Z9181 History of falling: Secondary | ICD-10-CM | POA: Diagnosis not present

## 2017-01-24 DIAGNOSIS — M25562 Pain in left knee: Secondary | ICD-10-CM | POA: Diagnosis not present

## 2017-01-24 DIAGNOSIS — D649 Anemia, unspecified: Secondary | ICD-10-CM | POA: Diagnosis not present

## 2017-01-25 ENCOUNTER — Encounter: Payer: Self-pay | Admitting: Family Medicine

## 2017-01-25 ENCOUNTER — Ambulatory Visit (INDEPENDENT_AMBULATORY_CARE_PROVIDER_SITE_OTHER): Payer: Medicare Other | Admitting: Family Medicine

## 2017-01-25 ENCOUNTER — Ambulatory Visit: Payer: Medicare Other | Admitting: Family Medicine

## 2017-01-25 ENCOUNTER — Telehealth: Payer: Self-pay | Admitting: *Deleted

## 2017-01-25 VITALS — BP 122/64 | HR 71 | Resp 15 | Ht 62.0 in | Wt 146.0 lb

## 2017-01-25 DIAGNOSIS — R296 Repeated falls: Secondary | ICD-10-CM | POA: Diagnosis not present

## 2017-01-25 DIAGNOSIS — M25561 Pain in right knee: Secondary | ICD-10-CM | POA: Diagnosis not present

## 2017-01-25 DIAGNOSIS — I10 Essential (primary) hypertension: Secondary | ICD-10-CM | POA: Diagnosis not present

## 2017-01-25 DIAGNOSIS — M25562 Pain in left knee: Secondary | ICD-10-CM | POA: Diagnosis not present

## 2017-01-25 DIAGNOSIS — Z9181 History of falling: Secondary | ICD-10-CM | POA: Diagnosis not present

## 2017-01-25 DIAGNOSIS — I6522 Occlusion and stenosis of left carotid artery: Secondary | ICD-10-CM | POA: Diagnosis not present

## 2017-01-25 DIAGNOSIS — M159 Polyosteoarthritis, unspecified: Secondary | ICD-10-CM

## 2017-01-25 DIAGNOSIS — R32 Unspecified urinary incontinence: Secondary | ICD-10-CM | POA: Diagnosis not present

## 2017-01-25 DIAGNOSIS — D649 Anemia, unspecified: Secondary | ICD-10-CM | POA: Diagnosis not present

## 2017-01-25 LAB — URINE CULTURE

## 2017-01-25 MED ORDER — UNDERPADS MISC: 11 refills | Status: DC

## 2017-01-25 MED ORDER — UNABLE TO FIND: 0 refills | Status: DC

## 2017-01-25 MED ORDER — ENSURE COMPLETE PO LIQD: 237.0000 mL | Freq: Two times a day (BID) | ORAL | 11 refills | Status: DC

## 2017-01-25 MED ORDER — DISPOSABLE BRIEF LARGE MISC: 11 refills | Status: DC

## 2017-01-25 NOTE — Telephone Encounter (Signed)
pls order x ray of affected shoulder and arm  I will sign

## 2017-01-25 NOTE — Progress Notes (Signed)
   Emma Dawson     MRN: [id number]      DOB: October 18, 1932   HPI Emma Dawson is here with both her nieces, Adina Puzzo and Joaquin Bend, neither of whom have HPOA to address the fact that she falls on average 3 times daily, and need for increased care and supervision. She does have medicaid and [facility] is already involved with her care. They report getting a night sitter as of last night. They report poor oral intake and are requesting ensure supplements and meals on wheels The request hospital bed , depends and chucks, wheelchair They request bed alarm They both state , that they want to care for her together at home and accept the responsibility Reason for this visit is consultation with both nieces named, to clarify patient's needs and that together they are both willing to see that her needs are met A great nephew is present also who I understand lifts and transports Emma Dawson. <Emma Dawson suffers from generalized  osteoarfthritis and falls often. This impairs her ability to safely perform ADL's in the home. A cane, or walker  Or crutch will not resolve this issue. She is unable to propel herself with a standard wheelchair , hence a light weight wheelchair is recommended as she will be able to safely propel this Emma Dawson suffers from chronic pain in back, hips, knees and shoulders from generalized osteoarthritis. A hospital bed will alleviate her pain by allowing her to be positioned in ways not feasible with a normal bed. Pain episodes require frequent and immediate changes in body position which cannot be achieved with a normal bed  ROS See HPI Called after visit for mobile xray of upper arm where she traumatized in most recent falls  .   PE BP 122/64   Pulse 71   Resp 15   Ht _0  (1.575 m)   SpO2 100%  146 lb  BP 122/64   Pulse 71   Resp 15   Ht _1  (1.575 m)   SpO2 100%   Patient alert and oriented and in no cardiopulmonary distress. Sitting in wheelchair  Ext: No  edema  Emma: decreased  ROM spine, shoulders, hips and knees.  Skin: Intact, no ulcerations or rash noted.     Assessment & Plan  Recurrent falls while walking Recurrent falls in past 4 to 6 months, unclear if there is neurologic compondenet, will refer for neuro eval Requires wheelchair for safe transportaion, lightweight wheelchair recommended  Incontinence in female Pt experiencing episodes of wetting and incontinence because of mobility limitations and age, needs incontinence supplies and chucks  Osteoarthritis Generalized osteoarthritis causing pain , debility and recurrent falls. Adversely affects sleep, requires hospital bed for repostioning

## 2017-01-25 NOTE — Patient Instructions (Addendum)
F/u end April, call if  You need me sooner  We will give you contact info for meals on wheels and ensure  We will provide info for hospital bed to supplier and order this and the bed alarm We will order chucks, depends and wheelchair  PLEASE depend 100% on your family to take care of you because you need care 100% of the time 24 hours per day, NO MORE FALLS  I am referring you to both orthopedics and neurology to see if anything  Can be done top reduce your risk of falls  Thank you  for choosing [facility]. We consider it a privelige to serve you.  Delivering excellent health care in a caring and  compassionate way is our goal.  Partnering with you,  so that together we can achieve this goal is our strategy.

## 2017-01-25 NOTE — Telephone Encounter (Signed)
PATIENT'S HOME HEALTH NURSE, SHANNON, CALLED REQUESTING VERBAL ORDER FOR PORTABLE XRAY FOR RIGHT SHOULDER AND ARM PAIN

## 2017-01-26 ENCOUNTER — Encounter: Payer: Self-pay | Admitting: Family Medicine

## 2017-01-26 ENCOUNTER — Telehealth: Payer: Self-pay

## 2017-01-26 DIAGNOSIS — Z9181 History of falling: Secondary | ICD-10-CM | POA: Diagnosis not present

## 2017-01-26 DIAGNOSIS — I6522 Occlusion and stenosis of left carotid artery: Secondary | ICD-10-CM | POA: Diagnosis not present

## 2017-01-26 DIAGNOSIS — D649 Anemia, unspecified: Secondary | ICD-10-CM | POA: Diagnosis not present

## 2017-01-26 DIAGNOSIS — M25561 Pain in right knee: Secondary | ICD-10-CM | POA: Diagnosis not present

## 2017-01-26 DIAGNOSIS — R32 Unspecified urinary incontinence: Secondary | ICD-10-CM | POA: Insufficient documentation

## 2017-01-26 DIAGNOSIS — M79631 Pain in right forearm: Secondary | ICD-10-CM | POA: Diagnosis not present

## 2017-01-26 DIAGNOSIS — I10 Essential (primary) hypertension: Secondary | ICD-10-CM | POA: Diagnosis not present

## 2017-01-26 DIAGNOSIS — M79601 Pain in right arm: Secondary | ICD-10-CM | POA: Diagnosis not present

## 2017-01-26 DIAGNOSIS — M25511 Pain in right shoulder: Secondary | ICD-10-CM | POA: Diagnosis not present

## 2017-01-26 DIAGNOSIS — M25521 Pain in right elbow: Secondary | ICD-10-CM | POA: Diagnosis not present

## 2017-01-26 DIAGNOSIS — M25562 Pain in left knee: Secondary | ICD-10-CM | POA: Diagnosis not present

## 2017-01-26 NOTE — Telephone Encounter (Signed)
She is requesting portable xray

## 2017-01-26 NOTE — Telephone Encounter (Signed)
Larene [place]- nurse at [facility] called and said that the patient reported blood when she had a BM and urinated and also is feeling weaker. Nurse wants to know since culture showed multiple bacteria if you wanted a repeat UA, stool test or any labs? Call back number is [phone]

## 2017-01-26 NOTE — Assessment & Plan Note (Signed)
Pt experiencing episodes of wetting and incontinence because of mobility limitations and age, needs incontinence supplies and chucks

## 2017-01-26 NOTE — Telephone Encounter (Signed)
Noted, thanks!

## 2017-01-26 NOTE — Telephone Encounter (Signed)
Verbal okay given.  

## 2017-01-26 NOTE — Telephone Encounter (Signed)
miultiple complaints/ concerns, rectal blood, blood in urine , weak, already established anemia, return to ED

## 2017-01-26 NOTE — Assessment & Plan Note (Signed)
Generalized osteoarthritis causing pain , debility and recurrent falls. Adversely affects sleep, requires hospital bed for repostioning

## 2017-01-26 NOTE — Assessment & Plan Note (Addendum)
Recurrent falls in past 4 to 6 months, unclear if there is neurologic compondenet, will refer for neuro eval Requires wheelchair for safe transportaion, lightweight wheelchair recommended

## 2017-01-27 DIAGNOSIS — M25562 Pain in left knee: Secondary | ICD-10-CM | POA: Diagnosis not present

## 2017-01-27 DIAGNOSIS — Z9181 History of falling: Secondary | ICD-10-CM | POA: Diagnosis not present

## 2017-01-27 DIAGNOSIS — I10 Essential (primary) hypertension: Secondary | ICD-10-CM | POA: Diagnosis not present

## 2017-01-27 DIAGNOSIS — D649 Anemia, unspecified: Secondary | ICD-10-CM | POA: Diagnosis not present

## 2017-01-27 DIAGNOSIS — M25561 Pain in right knee: Secondary | ICD-10-CM | POA: Diagnosis not present

## 2017-01-27 DIAGNOSIS — I6522 Occlusion and stenosis of left carotid artery: Secondary | ICD-10-CM | POA: Diagnosis not present

## 2017-01-30 DIAGNOSIS — M25561 Pain in right knee: Secondary | ICD-10-CM | POA: Diagnosis not present

## 2017-01-30 DIAGNOSIS — Z9181 History of falling: Secondary | ICD-10-CM | POA: Diagnosis not present

## 2017-01-30 DIAGNOSIS — M25562 Pain in left knee: Secondary | ICD-10-CM | POA: Diagnosis not present

## 2017-01-30 DIAGNOSIS — I6522 Occlusion and stenosis of left carotid artery: Secondary | ICD-10-CM | POA: Diagnosis not present

## 2017-01-30 DIAGNOSIS — D649 Anemia, unspecified: Secondary | ICD-10-CM | POA: Diagnosis not present

## 2017-01-30 DIAGNOSIS — I10 Essential (primary) hypertension: Secondary | ICD-10-CM | POA: Diagnosis not present

## 2017-01-31 ENCOUNTER — Encounter (HOSPITAL_COMMUNITY): Payer: Medicare Other

## 2017-01-31 ENCOUNTER — Ambulatory Visit: Payer: Medicare Other | Admitting: Family Medicine

## 2017-01-31 DIAGNOSIS — M25562 Pain in left knee: Secondary | ICD-10-CM | POA: Diagnosis not present

## 2017-01-31 DIAGNOSIS — I6522 Occlusion and stenosis of left carotid artery: Secondary | ICD-10-CM | POA: Diagnosis not present

## 2017-01-31 DIAGNOSIS — D649 Anemia, unspecified: Secondary | ICD-10-CM | POA: Diagnosis not present

## 2017-01-31 DIAGNOSIS — M25561 Pain in right knee: Secondary | ICD-10-CM | POA: Diagnosis not present

## 2017-01-31 DIAGNOSIS — Z9181 History of falling: Secondary | ICD-10-CM | POA: Diagnosis not present

## 2017-01-31 DIAGNOSIS — I10 Essential (primary) hypertension: Secondary | ICD-10-CM | POA: Diagnosis not present

## 2017-02-02 DIAGNOSIS — I6522 Occlusion and stenosis of left carotid artery: Secondary | ICD-10-CM | POA: Diagnosis not present

## 2017-02-02 DIAGNOSIS — Z9181 History of falling: Secondary | ICD-10-CM | POA: Diagnosis not present

## 2017-02-02 DIAGNOSIS — M25561 Pain in right knee: Secondary | ICD-10-CM | POA: Diagnosis not present

## 2017-02-02 DIAGNOSIS — M25562 Pain in left knee: Secondary | ICD-10-CM | POA: Diagnosis not present

## 2017-02-02 DIAGNOSIS — D649 Anemia, unspecified: Secondary | ICD-10-CM | POA: Diagnosis not present

## 2017-02-02 DIAGNOSIS — I10 Essential (primary) hypertension: Secondary | ICD-10-CM | POA: Diagnosis not present

## 2017-02-03 DIAGNOSIS — M25562 Pain in left knee: Secondary | ICD-10-CM | POA: Diagnosis not present

## 2017-02-03 DIAGNOSIS — I6522 Occlusion and stenosis of left carotid artery: Secondary | ICD-10-CM | POA: Diagnosis not present

## 2017-02-03 DIAGNOSIS — M25561 Pain in right knee: Secondary | ICD-10-CM | POA: Diagnosis not present

## 2017-02-03 DIAGNOSIS — D649 Anemia, unspecified: Secondary | ICD-10-CM | POA: Diagnosis not present

## 2017-02-03 DIAGNOSIS — Z9181 History of falling: Secondary | ICD-10-CM | POA: Diagnosis not present

## 2017-02-03 DIAGNOSIS — I10 Essential (primary) hypertension: Secondary | ICD-10-CM | POA: Diagnosis not present

## 2017-02-06 DIAGNOSIS — Z9181 History of falling: Secondary | ICD-10-CM | POA: Diagnosis not present

## 2017-02-06 DIAGNOSIS — I6522 Occlusion and stenosis of left carotid artery: Secondary | ICD-10-CM | POA: Diagnosis not present

## 2017-02-06 DIAGNOSIS — M25561 Pain in right knee: Secondary | ICD-10-CM | POA: Diagnosis not present

## 2017-02-06 DIAGNOSIS — M25562 Pain in left knee: Secondary | ICD-10-CM | POA: Diagnosis not present

## 2017-02-06 DIAGNOSIS — I10 Essential (primary) hypertension: Secondary | ICD-10-CM | POA: Diagnosis not present

## 2017-02-06 DIAGNOSIS — D649 Anemia, unspecified: Secondary | ICD-10-CM | POA: Diagnosis not present

## 2017-02-07 ENCOUNTER — Encounter (HOSPITAL_COMMUNITY): Payer: Medicare Other

## 2017-02-07 DIAGNOSIS — I10 Essential (primary) hypertension: Secondary | ICD-10-CM | POA: Diagnosis not present

## 2017-02-07 DIAGNOSIS — M25561 Pain in right knee: Secondary | ICD-10-CM | POA: Diagnosis not present

## 2017-02-07 DIAGNOSIS — M25562 Pain in left knee: Secondary | ICD-10-CM | POA: Diagnosis not present

## 2017-02-07 DIAGNOSIS — I6522 Occlusion and stenosis of left carotid artery: Secondary | ICD-10-CM | POA: Diagnosis not present

## 2017-02-07 DIAGNOSIS — D649 Anemia, unspecified: Secondary | ICD-10-CM | POA: Diagnosis not present

## 2017-02-07 DIAGNOSIS — Z9181 History of falling: Secondary | ICD-10-CM | POA: Diagnosis not present

## 2017-02-08 ENCOUNTER — Ambulatory Visit: Payer: Medicare Other | Admitting: Orthopaedic Surgery

## 2017-02-09 ENCOUNTER — Other Ambulatory Visit: Payer: Self-pay | Admitting: Family Medicine

## 2017-02-09 ENCOUNTER — Telehealth: Payer: Self-pay | Admitting: Family Medicine

## 2017-02-09 DIAGNOSIS — M25562 Pain in left knee: Secondary | ICD-10-CM | POA: Diagnosis not present

## 2017-02-09 DIAGNOSIS — Z9181 History of falling: Secondary | ICD-10-CM | POA: Diagnosis not present

## 2017-02-09 DIAGNOSIS — I10 Essential (primary) hypertension: Secondary | ICD-10-CM | POA: Diagnosis not present

## 2017-02-09 DIAGNOSIS — M25561 Pain in right knee: Secondary | ICD-10-CM | POA: Diagnosis not present

## 2017-02-09 DIAGNOSIS — I6522 Occlusion and stenosis of left carotid artery: Secondary | ICD-10-CM | POA: Diagnosis not present

## 2017-02-09 DIAGNOSIS — D649 Anemia, unspecified: Secondary | ICD-10-CM | POA: Diagnosis not present

## 2017-02-09 MED ORDER — TRAZODONE HCL 50 MG PO TABS: ORAL_TABLET | ORAL | 1 refills | Status: DC

## 2017-02-09 NOTE — Telephone Encounter (Signed)
Emma Dawson is asking if Dr. Moshe Cipro would call Emma Dawson in something to take at night to rest, she states that she is having "Sundowners" and shes up & down all night, please advise?

## 2017-02-09 NOTE — Telephone Encounter (Signed)
Trazodone half at bedtime sent to [facility] pls let her know

## 2017-02-10 NOTE — Telephone Encounter (Signed)
Aware. 

## 2017-02-14 ENCOUNTER — Encounter (HOSPITAL_COMMUNITY): Payer: Medicare Other

## 2017-02-14 ENCOUNTER — Ambulatory Visit: Payer: Medicare Other | Admitting: Orthopedic Surgery

## 2017-02-21 ENCOUNTER — Ambulatory Visit: Payer: Medicare Other | Admitting: Orthopedic Surgery

## 2017-02-22 ENCOUNTER — Encounter (HOSPITAL_COMMUNITY): Payer: Medicare Other | Attending: Hematology and Oncology

## 2017-02-22 VITALS — BP 146/51 | HR 80 | Temp 98.5°F | Resp 20

## 2017-02-22 DIAGNOSIS — Z9181 History of falling: Secondary | ICD-10-CM | POA: Diagnosis not present

## 2017-02-22 DIAGNOSIS — I6522 Occlusion and stenosis of left carotid artery: Secondary | ICD-10-CM | POA: Diagnosis not present

## 2017-02-22 DIAGNOSIS — I10 Essential (primary) hypertension: Secondary | ICD-10-CM | POA: Diagnosis not present

## 2017-02-22 DIAGNOSIS — Z853 Personal history of malignant neoplasm of breast: Secondary | ICD-10-CM

## 2017-02-22 DIAGNOSIS — M25561 Pain in right knee: Secondary | ICD-10-CM | POA: Diagnosis not present

## 2017-02-22 DIAGNOSIS — Z452 Encounter for adjustment and management of vascular access device: Secondary | ICD-10-CM | POA: Diagnosis present

## 2017-02-22 DIAGNOSIS — Z95828 Presence of other vascular implants and grafts: Secondary | ICD-10-CM

## 2017-02-22 DIAGNOSIS — D649 Anemia, unspecified: Secondary | ICD-10-CM | POA: Diagnosis not present

## 2017-02-22 DIAGNOSIS — M25562 Pain in left knee: Secondary | ICD-10-CM | POA: Diagnosis not present

## 2017-02-22 MED ORDER — SODIUM CHLORIDE 0.9% FLUSH: 10.0000 mL | INTRAVENOUS | Status: DC | PRN

## 2017-02-22 MED ORDER — HEPARIN SOD (PORK) LOCK FLUSH 100 UNIT/ML IV SOLN
500.0000 [IU] | Freq: Once | INTRAVENOUS | Status: AC
  Administered 2017-02-22: 500 [IU] via INTRAVENOUS

## 2017-02-22 NOTE — Progress Notes (Signed)
Emma Dawson presented for Portacath access and flush. Portacath located lt chest wall accessed with  H 20 needle. No blood return. Portacath flushed with 31ml NS and 500U/6ml Heparin and needle removed intact. Procedure without incident. Patient tolerated procedure well.

## 2017-02-23 ENCOUNTER — Ambulatory Visit: Payer: Medicare Other

## 2017-02-23 DIAGNOSIS — M25562 Pain in left knee: Secondary | ICD-10-CM | POA: Diagnosis not present

## 2017-02-23 DIAGNOSIS — I10 Essential (primary) hypertension: Secondary | ICD-10-CM | POA: Diagnosis not present

## 2017-02-23 DIAGNOSIS — D649 Anemia, unspecified: Secondary | ICD-10-CM | POA: Diagnosis not present

## 2017-02-23 DIAGNOSIS — Z9181 History of falling: Secondary | ICD-10-CM | POA: Diagnosis not present

## 2017-02-23 DIAGNOSIS — M25561 Pain in right knee: Secondary | ICD-10-CM | POA: Diagnosis not present

## 2017-02-23 DIAGNOSIS — I6522 Occlusion and stenosis of left carotid artery: Secondary | ICD-10-CM | POA: Diagnosis not present

## 2017-02-24 DIAGNOSIS — M25562 Pain in left knee: Secondary | ICD-10-CM | POA: Diagnosis not present

## 2017-02-24 DIAGNOSIS — D649 Anemia, unspecified: Secondary | ICD-10-CM | POA: Diagnosis not present

## 2017-02-24 DIAGNOSIS — I10 Essential (primary) hypertension: Secondary | ICD-10-CM | POA: Diagnosis not present

## 2017-02-24 DIAGNOSIS — M25561 Pain in right knee: Secondary | ICD-10-CM | POA: Diagnosis not present

## 2017-02-24 DIAGNOSIS — I6522 Occlusion and stenosis of left carotid artery: Secondary | ICD-10-CM | POA: Diagnosis not present

## 2017-02-24 DIAGNOSIS — Z9181 History of falling: Secondary | ICD-10-CM | POA: Diagnosis not present

## 2017-02-27 ENCOUNTER — Ambulatory Visit: Payer: Medicare Other | Admitting: Orthopedic Surgery

## 2017-02-27 DIAGNOSIS — I6522 Occlusion and stenosis of left carotid artery: Secondary | ICD-10-CM | POA: Diagnosis not present

## 2017-02-27 DIAGNOSIS — I10 Essential (primary) hypertension: Secondary | ICD-10-CM | POA: Diagnosis not present

## 2017-02-27 DIAGNOSIS — M25561 Pain in right knee: Secondary | ICD-10-CM | POA: Diagnosis not present

## 2017-02-27 DIAGNOSIS — D649 Anemia, unspecified: Secondary | ICD-10-CM | POA: Diagnosis not present

## 2017-02-27 DIAGNOSIS — M25562 Pain in left knee: Secondary | ICD-10-CM | POA: Diagnosis not present

## 2017-02-27 DIAGNOSIS — Z9181 History of falling: Secondary | ICD-10-CM | POA: Diagnosis not present

## 2017-02-28 DIAGNOSIS — G301 Alzheimer's disease with late onset: Secondary | ICD-10-CM | POA: Diagnosis not present

## 2017-02-28 DIAGNOSIS — G4701 Insomnia due to medical condition: Secondary | ICD-10-CM | POA: Diagnosis not present

## 2017-02-28 DIAGNOSIS — R2689 Other abnormalities of gait and mobility: Secondary | ICD-10-CM | POA: Diagnosis not present

## 2017-02-28 DIAGNOSIS — M5003 Cervical disc disorder with myelopathy, cervicothoracic region: Secondary | ICD-10-CM | POA: Diagnosis not present

## 2017-02-28 DIAGNOSIS — M130 Polyarthritis, unspecified: Secondary | ICD-10-CM | POA: Diagnosis not present

## 2017-03-01 DIAGNOSIS — D649 Anemia, unspecified: Secondary | ICD-10-CM | POA: Diagnosis not present

## 2017-03-01 DIAGNOSIS — I10 Essential (primary) hypertension: Secondary | ICD-10-CM | POA: Diagnosis not present

## 2017-03-01 DIAGNOSIS — M25561 Pain in right knee: Secondary | ICD-10-CM | POA: Diagnosis not present

## 2017-03-01 DIAGNOSIS — Z9181 History of falling: Secondary | ICD-10-CM | POA: Diagnosis not present

## 2017-03-01 DIAGNOSIS — M25562 Pain in left knee: Secondary | ICD-10-CM | POA: Diagnosis not present

## 2017-03-01 DIAGNOSIS — I6522 Occlusion and stenosis of left carotid artery: Secondary | ICD-10-CM | POA: Diagnosis not present

## 2017-03-03 DIAGNOSIS — M25562 Pain in left knee: Secondary | ICD-10-CM | POA: Diagnosis not present

## 2017-03-03 DIAGNOSIS — D649 Anemia, unspecified: Secondary | ICD-10-CM | POA: Diagnosis not present

## 2017-03-03 DIAGNOSIS — Z9181 History of falling: Secondary | ICD-10-CM | POA: Diagnosis not present

## 2017-03-03 DIAGNOSIS — I6522 Occlusion and stenosis of left carotid artery: Secondary | ICD-10-CM | POA: Diagnosis not present

## 2017-03-03 DIAGNOSIS — M25561 Pain in right knee: Secondary | ICD-10-CM | POA: Diagnosis not present

## 2017-03-03 DIAGNOSIS — I10 Essential (primary) hypertension: Secondary | ICD-10-CM | POA: Diagnosis not present

## 2017-03-06 ENCOUNTER — Ambulatory Visit: Payer: Medicare Other | Admitting: Orthopedic Surgery

## 2017-03-06 DIAGNOSIS — I10 Essential (primary) hypertension: Secondary | ICD-10-CM | POA: Diagnosis not present

## 2017-03-06 DIAGNOSIS — M25562 Pain in left knee: Secondary | ICD-10-CM | POA: Diagnosis not present

## 2017-03-06 DIAGNOSIS — Z9181 History of falling: Secondary | ICD-10-CM | POA: Diagnosis not present

## 2017-03-06 DIAGNOSIS — I6522 Occlusion and stenosis of left carotid artery: Secondary | ICD-10-CM | POA: Diagnosis not present

## 2017-03-06 DIAGNOSIS — M25561 Pain in right knee: Secondary | ICD-10-CM | POA: Diagnosis not present

## 2017-03-06 DIAGNOSIS — D649 Anemia, unspecified: Secondary | ICD-10-CM | POA: Diagnosis not present

## 2017-03-07 DIAGNOSIS — Z853 Personal history of malignant neoplasm of breast: Secondary | ICD-10-CM | POA: Diagnosis not present

## 2017-03-07 DIAGNOSIS — Z9181 History of falling: Secondary | ICD-10-CM | POA: Diagnosis not present

## 2017-03-07 DIAGNOSIS — I6522 Occlusion and stenosis of left carotid artery: Secondary | ICD-10-CM | POA: Diagnosis not present

## 2017-03-07 DIAGNOSIS — M1991 Primary osteoarthritis, unspecified site: Secondary | ICD-10-CM | POA: Diagnosis not present

## 2017-03-07 DIAGNOSIS — I10 Essential (primary) hypertension: Secondary | ICD-10-CM | POA: Diagnosis not present

## 2017-03-07 DIAGNOSIS — Z9011 Acquired absence of right breast and nipple: Secondary | ICD-10-CM | POA: Diagnosis not present

## 2017-03-07 DIAGNOSIS — D649 Anemia, unspecified: Secondary | ICD-10-CM | POA: Diagnosis not present

## 2017-03-07 DIAGNOSIS — Z9221 Personal history of antineoplastic chemotherapy: Secondary | ICD-10-CM | POA: Diagnosis not present

## 2017-03-07 DIAGNOSIS — Z923 Personal history of irradiation: Secondary | ICD-10-CM | POA: Diagnosis not present

## 2017-03-13 ENCOUNTER — Other Ambulatory Visit (HOSPITAL_COMMUNITY): Payer: Self-pay | Admitting: Podiatry

## 2017-03-13 ENCOUNTER — Ambulatory Visit (HOSPITAL_COMMUNITY)
Admission: RE | Admit: 2017-03-13 | Discharge: 2017-03-13 | Disposition: A | Discharge status: Home / Self Care | Payer: Medicare Other | Source: Ambulatory Visit | Attending: Podiatry | Admitting: Podiatry

## 2017-03-13 DIAGNOSIS — M79662 Pain in left lower leg: Secondary | ICD-10-CM | POA: Diagnosis not present

## 2017-03-13 DIAGNOSIS — M79604 Pain in right leg: Secondary | ICD-10-CM | POA: Insufficient documentation

## 2017-03-13 DIAGNOSIS — M7989 Other specified soft tissue disorders: Secondary | ICD-10-CM | POA: Insufficient documentation

## 2017-03-13 DIAGNOSIS — I82409 Acute embolism and thrombosis of unspecified deep veins of unspecified lower extremity: Secondary | ICD-10-CM

## 2017-03-13 DIAGNOSIS — M79673 Pain in unspecified foot: Secondary | ICD-10-CM | POA: Diagnosis not present

## 2017-03-13 DIAGNOSIS — R600 Localized edema: Secondary | ICD-10-CM | POA: Diagnosis not present

## 2017-03-13 DIAGNOSIS — I82401 Acute embolism and thrombosis of unspecified deep veins of right lower extremity: Secondary | ICD-10-CM | POA: Diagnosis not present

## 2017-03-13 DIAGNOSIS — R609 Edema, unspecified: Secondary | ICD-10-CM | POA: Diagnosis not present

## 2017-03-14 DIAGNOSIS — Z853 Personal history of malignant neoplasm of breast: Secondary | ICD-10-CM | POA: Diagnosis not present

## 2017-03-14 DIAGNOSIS — I10 Essential (primary) hypertension: Secondary | ICD-10-CM | POA: Diagnosis not present

## 2017-03-14 DIAGNOSIS — I6522 Occlusion and stenosis of left carotid artery: Secondary | ICD-10-CM | POA: Diagnosis not present

## 2017-03-14 DIAGNOSIS — D649 Anemia, unspecified: Secondary | ICD-10-CM | POA: Diagnosis not present

## 2017-03-14 DIAGNOSIS — M1991 Primary osteoarthritis, unspecified site: Secondary | ICD-10-CM | POA: Diagnosis not present

## 2017-03-14 DIAGNOSIS — Z9181 History of falling: Secondary | ICD-10-CM | POA: Diagnosis not present

## 2017-03-16 ENCOUNTER — Telehealth: Payer: Self-pay | Admitting: Family Medicine

## 2017-03-16 ENCOUNTER — Ambulatory Visit: Payer: Medicare Other | Admitting: Family Medicine

## 2017-03-16 ENCOUNTER — Encounter: Payer: Self-pay | Admitting: Family Medicine

## 2017-03-16 ENCOUNTER — Ambulatory Visit: Payer: Medicare Other

## 2017-03-16 NOTE — Telephone Encounter (Signed)
Patients legal guardian(per her) called this afternoon to request a rollator, a script  for adult pullups, and a wheelchair.  She is also requesting information on a nursing home for the patient, says she is not doing anything she is suppose to, says she missed her appointment this morning because she refused to come.  cb#: [phone] [name] Foye-legal guardian)

## 2017-03-17 ENCOUNTER — Other Ambulatory Visit: Payer: Self-pay

## 2017-03-17 DIAGNOSIS — R296 Repeated falls: Secondary | ICD-10-CM

## 2017-03-17 MED ORDER — DISPOSABLE BRIEF LARGE MISC: 11 refills | Status: DC

## 2017-03-17 MED ORDER — UNDERPADS MISC: 11 refills | Status: DC

## 2017-03-17 NOTE — Telephone Encounter (Signed)
Ok to send scripts? (ov will have to be addended for wheelchair need)

## 2017-03-17 NOTE — Telephone Encounter (Signed)
Called to let her know scripts were ready for pickup, no answer, left voicemail.

## 2017-03-17 NOTE — Telephone Encounter (Signed)
She may have the pullups and I thought she already had the wheelchair, recurrent falls, don't know what the other is.Last visit was less than 3 months ago Refer her to list of nursing homes she does the research

## 2017-03-17 NOTE — Progress Notes (Signed)
rxs sent

## 2017-03-21 DIAGNOSIS — Z9181 History of falling: Secondary | ICD-10-CM | POA: Diagnosis not present

## 2017-03-21 DIAGNOSIS — I6522 Occlusion and stenosis of left carotid artery: Secondary | ICD-10-CM | POA: Diagnosis not present

## 2017-03-21 DIAGNOSIS — D649 Anemia, unspecified: Secondary | ICD-10-CM | POA: Diagnosis not present

## 2017-03-21 DIAGNOSIS — I10 Essential (primary) hypertension: Secondary | ICD-10-CM | POA: Diagnosis not present

## 2017-03-21 DIAGNOSIS — M1991 Primary osteoarthritis, unspecified site: Secondary | ICD-10-CM | POA: Diagnosis not present

## 2017-03-21 DIAGNOSIS — Z853 Personal history of malignant neoplasm of breast: Secondary | ICD-10-CM | POA: Diagnosis not present

## 2017-03-29 ENCOUNTER — Telehealth: Payer: Self-pay | Admitting: Family Medicine

## 2017-03-29 ENCOUNTER — Ambulatory Visit (INDEPENDENT_AMBULATORY_CARE_PROVIDER_SITE_OTHER): Payer: Medicare Other

## 2017-03-29 VITALS — BP 160/82 | HR 73 | Temp 98.8°F | Ht 62.0 in | Wt 143.0 lb

## 2017-03-29 DIAGNOSIS — Z0000 Encounter for general adult medical examination without abnormal findings: Secondary | ICD-10-CM | POA: Diagnosis not present

## 2017-03-29 NOTE — Telephone Encounter (Signed)
Nephew is not a contact. PT was started in April by [facility]. I'm not sure what he is referring to

## 2017-03-29 NOTE — Telephone Encounter (Signed)
I thought we already referred her for in home pT twice weekly for 4 weeks if not please do this , thanks, dx high fall risk h/o multiple falls

## 2017-03-29 NOTE — Patient Instructions (Addendum)
Emma Dawson , Thank you for taking time to come for your Medicare Wellness Visit. I appreciate your ongoing commitment to your health goals. Please review the following plan we discussed and let me know if I can assist you in the future.   Screening recommendations/referrals: Colonoscopy: No longer required Mammogram: No longer required Bone Density: Due, declines Recommended yearly ophthalmology/optometry visit for glaucoma screening and checkup Recommended yearly dental visit for hygiene and checkup  Vaccinations: Influenza vaccine: Up to date, next due 06/2017 Pneumococcal vaccine: Due, declines Tdap vaccine: Up to date, next due 05/2025 Shingles vaccine: Due, declines  Advanced directives: Advance directive discussed with you today. I have provided a copy for you to complete at home and have notarized. Once this is complete please bring a copy in to our office so we can scan it into your chart.  Conditions/risks identified: Pre-obese, recommend starting a routine exercise program at least 3 days a week for 30-45 minutes at a time as tolerated.   Next appointment: Follow up with Dr. Moshe Cipro in 3 months. Follow up in 1 year for your annual wellness visit.  Preventive Care 81 Years and Older, Female Preventive care refers to lifestyle choices and visits with your health care provider that can promote health and wellness. What does preventive care include?  A yearly physical exam. This is also called an annual well check.  Dental exams once or twice a year.  Routine eye exams. Ask your health care provider how often you should have your eyes checked.  Personal lifestyle choices, including:  Daily care of your teeth and gums.  Regular physical activity.  Eating a healthy diet.  Avoiding tobacco and drug use.  Limiting alcohol use.  Practicing safe sex.  Taking low-dose aspirin every day.  Taking vitamin and mineral supplements as recommended by your health care  provider. What happens during an annual well check? The services and screenings done by your health care provider during your annual well check will depend on your age, overall health, lifestyle risk factors, and family history of disease. Counseling  Your health care provider may ask you questions about your:  Alcohol use.  Tobacco use.  Drug use.  Emotional well-being.  Home and relationship well-being.  Sexual activity.  Eating habits.  History of falls.  Memory and ability to understand (cognition).  Work and work Statistician.  Reproductive health. Screening  You may have the following tests or measurements:  Height, weight, and BMI.  Blood pressure.  Lipid and cholesterol levels. These may be checked every 5 years, or more frequently if you are over 81 years old.  Skin check.  Lung cancer screening. You may have this screening every year starting at age 81 if you have a 30-pack-year history of smoking and currently smoke or have quit within the past 15 years.  Fecal occult blood test (FOBT) of the stool. You may have this test every year starting at age 81.  Flexible sigmoidoscopy or colonoscopy. You may have a sigmoidoscopy every 5 years or a colonoscopy every 10 years starting at age 81.  Hepatitis C blood test.  Hepatitis B blood test.  Sexually transmitted disease (STD) testing.  Diabetes screening. This is done by checking your blood sugar (glucose) after you have not eaten for a while (fasting). You may have this done every 81-3 years.  Bone density scan. This is done to screen for osteoporosis. You may have this done starting at age 81.  Mammogram. This may be done every  1-2 years. Talk to your health care provider about how often you should have regular mammograms. Talk with your health care provider about your test results, treatment options, and if necessary, the need for more tests. Vaccines  Your health care provider may recommend certain  vaccines, such as:  Influenza vaccine. This is recommended every year.  Tetanus, diphtheria, and acellular pertussis (Tdap, Td) vaccine. You may need a Td booster every 10 years.  Zoster vaccine. You may need this after age 81.  Pneumococcal 13-valent conjugate (PCV13) vaccine. One dose is recommended after age 81.  Pneumococcal polysaccharide (PPSV23) vaccine. One dose is recommended after age 81. Talk to your health care provider about which screenings and vaccines you need and how often you need them. This information is not intended to replace advice given to you by your health care provider. Make sure you discuss any questions you have with your health care provider. Document Released: 12/04/2015 Document Revised: 07/27/2016 Document Reviewed: 09/08/2015 Elsevier Interactive Patient Education  2017 [place] Prevention in the Home Falls can cause injuries. They can happen to people of all ages. There are many things you can do to make your home safe and to help prevent falls. What can I do on the outside of my home?  Regularly fix the edges of walkways and driveways and fix any cracks.  Remove anything that might make you trip as you walk through a door, such as a raised step or threshold.  Trim any bushes or trees on the path to your home.  Use bright outdoor lighting.  Clear any walking paths of anything that might make someone trip, such as rocks or tools.  Regularly check to see if handrails are loose or broken. Make sure that both sides of any steps have handrails.  Any raised decks and porches should have guardrails on the edges.  Have any leaves, snow, or ice cleared regularly.  Use sand or salt on walking paths during winter.  Clean up any spills in your garage right away. This includes oil or grease spills. What can I do in the bathroom?  Use night lights.  Install grab bars by the toilet and in the tub and shower. Do not use towel bars as grab  bars.  Use non-skid mats or decals in the tub or shower.  If you need to sit down in the shower, use a plastic, non-slip stool.  Keep the floor dry. Clean up any water that spills on the floor as soon as it happens.  Remove soap buildup in the tub or shower regularly.  Attach bath mats securely with double-sided non-slip rug tape.  Do not have throw rugs and other things on the floor that can make you trip. What can I do in the bedroom?  Use night lights.  Make sure that you have a light by your bed that is easy to reach.  Do not use any sheets or blankets that are too big for your bed. They should not hang down onto the floor.  Have a firm chair that has side arms. You can use this for support while you get dressed.  Do not have throw rugs and other things on the floor that can make you trip. What can I do in the kitchen?  Clean up any spills right away.  Avoid walking on wet floors.  Keep items that you use a lot in easy-to-reach places.  If you need to reach something above you, use a strong  step stool that has a grab bar.  Keep electrical cords out of the way.  Do not use floor polish or wax that makes floors slippery. If you must use wax, use non-skid floor wax.  Do not have throw rugs and other things on the floor that can make you trip. What can I do with my stairs?  Do not leave any items on the stairs.  Make sure that there are handrails on both sides of the stairs and use them. Fix handrails that are broken or loose. Make sure that handrails are as long as the stairways.  Check any carpeting to make sure that it is firmly attached to the stairs. Fix any carpet that is loose or worn.  Avoid having throw rugs at the top or bottom of the stairs. If you do have throw rugs, attach them to the floor with carpet tape.  Make sure that you have a light switch at the top of the stairs and the bottom of the stairs. If you do not have them, ask someone to add them for  you. What else can I do to help prevent falls?  Wear shoes that:  Do not have high heels.  Have rubber bottoms.  Are comfortable and fit you well.  Are closed at the toe. Do not wear sandals.  If you use a stepladder:  Make sure that it is fully opened. Do not climb a closed stepladder.  Make sure that both sides of the stepladder are locked into place.  Ask someone to hold it for you, if possible.  Clearly mark and make sure that you can see:  Any grab bars or handrails.  First and last steps.  Where the edge of each step is.  Use tools that help you move around (mobility aids) if they are needed. These include:  Canes.  Walkers.  Scooters.  Crutches.  Turn on the lights when you go into a dark area. Replace any light bulbs as soon as they burn out.  Set up your furniture so you have a clear path. Avoid moving your furniture around.  If any of your floors are uneven, fix them.  If there are any pets around you, be aware of where they are.  Review your medicines with your doctor. Some medicines can make you feel dizzy. This can increase your chance of falling. Ask your doctor what other things that you can do to help prevent falls. This information is not intended to replace advice given to you by your health care provider. Make sure you discuss any questions you have with your health care provider. Document Released: 09/03/2009 Document Revised: 04/14/2016 Document Reviewed: 12/12/2014 Elsevier Interactive Patient Education  2017 [employer].

## 2017-03-29 NOTE — Telephone Encounter (Signed)
Please advise 

## 2017-03-29 NOTE — Progress Notes (Signed)
Subjective:   Emma Dawson is a 83 y.o. female who presents for Medicare Annual (Subsequent) preventive examination.  Review of Systems:  Cardiac Risk Factors include: advanced age (>18men, >67 women);dyslipidemia;hypertension;sedentary lifestyle     Objective:     Vitals: BP (!) 160/82   Pulse 73   Temp 98.8 F (37.1 C) (Oral)   Ht 5\' 2"  (1.575 m)   Wt 143 lb 0.6 oz (64.9 kg)   SpO2 98%   BMI 26.16 kg/m   Body mass index is 26.16 kg/m.   Tobacco History  Smoking Status  . Never Smoker  Smokeless Tobacco  . Never Used     Counseling given: Not Answered   Past Medical History:  Diagnosis Date  . Allergic rhinitis   . Cancer ([place]) 03/2003   radiation and chemo [facility]  . History of breast cancer   . History of chemotherapy    And radiation secondary to breast cancer  . Hyperlipidemia   . Hypertension   . IGT (impaired glucose tolerance) 2014   DIET MANAGED  . Normocytic anemia   . Osteoarthritis   . Port catheter in place 03/05/2013   Past Surgical History:  Procedure Laterality Date  . ABDOMINAL HYSTERECTOMY  1970   Fibroid tumors, benign  . BREAST SURGERY Right 05/06/2003   MRM  . MASTECTOMY  2004   Right  . PORTACATH PLACEMENT     Family History  Problem Relation Age of Onset  . Stroke Mother   . Heart attack Father   . Coronary artery disease Father   . Coronary artery disease Brother   . Heart attack Sister   . Heart disease Sister   . Arthritis Sister   . Heart disease Sister   . Dementia Sister   . Lung cancer Brother   . Prostate cancer Brother    History  Sexual Activity  . Sexual activity: Not Currently    Outpatient Encounter Prescriptions as of 03/29/2017  Medication Sig  . acetaminophen (TYLENOL) 500 MG tablet One tablet twice daily as needed for knee pain (Patient taking differently: Take 500 mg by mouth every 8 (eight) hours as needed for mild pain or moderate pain. )  . amLODipine (NORVASC) 10 MG tablet TAKE 1 TABLET BY  MOUTH DAILY.  Marland Kitchen aspirin 81 MG tablet Take 81 mg by mouth daily.   Marland Kitchen docusate sodium (COLACE) 100 MG capsule Take 100 mg by mouth daily as needed for mild constipation.  . feeding supplement, ENSURE COMPLETE, (ENSURE COMPLETE) LIQD Take 237 mLs by mouth 2 (two) times daily between meals. (Patient taking differently: Take 237 mLs by mouth daily. )  . Incontinence Supply Disposable (DISPOSABLE BRIEF LARGE) MISC Use as needed  . Incontinence Supply Disposable (UNDERPADS) MISC Use as needed  . losartan (COZAAR) 100 MG tablet TAKE 1 TABLET BY MOUTH ONCE A DAY.  . Multiple Vitamin (MULTIVITAMIN WITH MINERALS) TABS tablet Take 1 tablet by mouth daily.  . simvastatin (ZOCOR) 40 MG tablet Take 40 mg by mouth daily.  . traZODone (DESYREL) 50 MG tablet Half tablet at bedtime, as needed,  . UNABLE TO FIND Bed alarm dx- R29.6  . Vitamin D, Ergocalciferol, (DRISDOL) 50000 units CAPS capsule Take 1 capsule (50,000 Units total) by mouth every 7 (seven) days. (Patient taking differently: Take 50,000 Units by mouth every 7 (seven) days. Take on wednesday)  . zolpidem (AMBIEN) 5 MG tablet Take 1 tablet by mouth daily.   Facility-Administered Encounter Medications as of 03/29/2017  Medication  . sodium chloride 0.9 % injection 10 mL    Activities of Daily Living In your present state of health, do you have any difficulty performing the following activities: 03/29/2017 12/16/2016  Hearing? N N  Vision? N N  Difficulty concentrating or making decisions? Y N  Walking or climbing stairs? Y N  Dressing or bathing? Y N  Doing errands, shopping? Tempie Donning  Preparing Food and eating ? Y -  Using the Toilet? Y -  In the past six months, have you accidently leaked urine? Y -  Do you have problems with loss of bowel control? Y -  Managing your Medications? Y -  Managing your Finances? Y -  Housekeeping or managing your Housekeeping? Y -  Some recent data might be hidden    Patient Care Team: Fayrene Helper, MD as  PCP - General    Assessment:    Exercise Activities and Dietary recommendations Current Exercise Habits: Home exercise routine, Time (Minutes): 10, Frequency (Times/Week): 2, Weekly Exercise (Minutes/Week): 20, Intensity: Mild, Exercise limited by: cardiac condition(s)  Goals    . Exercise 3x per week (30 min per time)          Recommend starting a routine exercise program at least 3 days a week for 30-45 minutes at a time as tolerated.      . Increase water intake      Fall Risk Fall Risk  03/29/2017 01/25/2017 12/16/2016 02/22/2016 12/02/2014  Falls in the past year? Yes Yes Yes Yes No  Number falls in past yr: 2 or more 2 or more 2 or more 1 -  Injury with Fall? No Yes Yes No -  Risk Factor Category  High Fall Risk High Fall Risk - - -  Risk for fall due to : History of fall(s);Impaired balance/gait;Impaired mobility - - - -  Risk for fall due to (comments): - - - - -  Follow up Falls evaluation completed;Education provided;Falls prevention discussed - - - -   Depression Screen PHQ 2/9 Scores 03/29/2017 12/16/2016 10/07/2014 08/26/2014  PHQ - 2 Score 0 0 0 0     Cognitive Function MMSE - Mini Mental State Exam 03/29/2017 06/02/2015  Orientation to time 4 4  Orientation to Place 5 4  Registration 3 3  Attention/ Calculation 3 3  Recall 0 1  Language- name 2 objects 2 1  Language- repeat 1 1  Language- follow 3 step command 2 3  Language- read & follow direction 1 1  Write a sentence 1 1  Copy design 0 0  Total score 22 22        Immunization History  Administered Date(s) Administered  . Influenza Split 08/23/2013  . Influenza Whole 08/24/2006, 09/12/2007, 08/15/2008, 08/28/2009, 08/26/2010  . Influenza,inj,Quad PF,36+ Mos 08/26/2014, 08/05/2015, 07/20/2016  . Tdap 06/03/2015   Screening Tests Health Maintenance  Topic Date Due  . PNA vac Low Risk Adult (1 of 2 - PCV13) 01/04/2018 (Originally 12/20/1998)  . DEXA SCAN  03/29/2018 (Originally 12/20/1998)  . INFLUENZA  VACCINE  06/21/2017  . TETANUS/TDAP  06/02/2025      Plan:     I have personally reviewed and noted the following in the patient's chart:   . Medical and social history . Use of alcohol, tobacco or illicit drugs  . Current medications and supplements . Functional ability and status . Nutritional status . Physical activity . Advanced directives . List of other physicians . Hospitalizations, surgeries, and ER visits in  previous 12 months . Vitals . Screenings to include cognitive, depression, and falls . Referrals and appointments  In addition, I have reviewed and discussed with patient certain preventive protocols, quality metrics, and best practice recommendations. A written personalized care plan for preventive services as well as general preventive health recommendations were provided to patient.     Stormy Fabian, LPN  06/22/4234

## 2017-03-29 NOTE — Telephone Encounter (Signed)
Nephew Dyshawn here today in office with patient as he brought her in for Wellness visit.  He is asking if there is any way you would write a script for physical therapy or something to keep her moving. Her legs are giving out and he thinks she could benefit from this.  In home or even going to a facility.  Please call and advise ASAP

## 2017-03-31 NOTE — Telephone Encounter (Signed)
Joaquin Bend (Niece)  Showing 1 of 1    [phone]      Spoke with Lauro Regulus (daughter). She states that PT is no longer available.  Therapist were coming 1 or 2 x per week, but its been 2 weeks since they were there last.  Daughter says that she needs this more often because her legs are buckling and she is week because she's not getting enough movement. [phone]

## 2017-03-31 NOTE — Telephone Encounter (Signed)
[facility] to do a re evaluation to see if more frequent therapy is needed. Family also interested in short term more intense therapy at a rehab facility if this doesn't work

## 2017-04-04 DIAGNOSIS — I6522 Occlusion and stenosis of left carotid artery: Secondary | ICD-10-CM | POA: Diagnosis not present

## 2017-04-04 DIAGNOSIS — D649 Anemia, unspecified: Secondary | ICD-10-CM | POA: Diagnosis not present

## 2017-04-04 DIAGNOSIS — Z853 Personal history of malignant neoplasm of breast: Secondary | ICD-10-CM | POA: Diagnosis not present

## 2017-04-04 DIAGNOSIS — I10 Essential (primary) hypertension: Secondary | ICD-10-CM | POA: Diagnosis not present

## 2017-04-04 DIAGNOSIS — Z9181 History of falling: Secondary | ICD-10-CM | POA: Diagnosis not present

## 2017-04-04 DIAGNOSIS — M1991 Primary osteoarthritis, unspecified site: Secondary | ICD-10-CM | POA: Diagnosis not present

## 2017-04-07 DIAGNOSIS — M1991 Primary osteoarthritis, unspecified site: Secondary | ICD-10-CM | POA: Diagnosis not present

## 2017-04-07 DIAGNOSIS — I10 Essential (primary) hypertension: Secondary | ICD-10-CM | POA: Diagnosis not present

## 2017-04-07 DIAGNOSIS — D649 Anemia, unspecified: Secondary | ICD-10-CM | POA: Diagnosis not present

## 2017-04-07 DIAGNOSIS — I6522 Occlusion and stenosis of left carotid artery: Secondary | ICD-10-CM | POA: Diagnosis not present

## 2017-04-07 DIAGNOSIS — Z9181 History of falling: Secondary | ICD-10-CM | POA: Diagnosis not present

## 2017-04-07 DIAGNOSIS — Z853 Personal history of malignant neoplasm of breast: Secondary | ICD-10-CM | POA: Diagnosis not present

## 2017-04-10 ENCOUNTER — Telehealth: Payer: Self-pay | Admitting: Family Medicine

## 2017-04-10 DIAGNOSIS — I10 Essential (primary) hypertension: Secondary | ICD-10-CM | POA: Diagnosis not present

## 2017-04-10 DIAGNOSIS — Z853 Personal history of malignant neoplasm of breast: Secondary | ICD-10-CM | POA: Diagnosis not present

## 2017-04-10 DIAGNOSIS — I6522 Occlusion and stenosis of left carotid artery: Secondary | ICD-10-CM | POA: Diagnosis not present

## 2017-04-10 DIAGNOSIS — D649 Anemia, unspecified: Secondary | ICD-10-CM | POA: Diagnosis not present

## 2017-04-10 DIAGNOSIS — Z9181 History of falling: Secondary | ICD-10-CM | POA: Diagnosis not present

## 2017-04-10 DIAGNOSIS — M1991 Primary osteoarthritis, unspecified site: Secondary | ICD-10-CM | POA: Diagnosis not present

## 2017-04-10 NOTE — Telephone Encounter (Signed)
Patient niece Emma Dawson is requesting an order for a nursing home for patient, says her diagnosis of Brock Ra  is getting worse, she is mistreating caregivers, accusing them of stealing, turning off the air conditioning so they have to sit in the heat. Emma Dawson states she has lost her husband and that this situation is to much for her, she cannot take care of Emma Dawson anymore. Please call to discuss [phone]

## 2017-04-10 NOTE — Telephone Encounter (Signed)
Most direct way to get help needed is to complete FL2 form and to ask social worker with [facility] to help the family with placement , this will be the approach that we take, let the family know please I will sign completed form, I will also try to speak with Tammy, but you do not need to call [facility]

## 2017-04-11 ENCOUNTER — Telehealth: Payer: Self-pay | Admitting: Family Medicine

## 2017-04-11 DIAGNOSIS — R2689 Other abnormalities of gait and mobility: Secondary | ICD-10-CM | POA: Diagnosis not present

## 2017-04-11 DIAGNOSIS — G4701 Insomnia due to medical condition: Secondary | ICD-10-CM | POA: Diagnosis not present

## 2017-04-11 DIAGNOSIS — G301 Alzheimer's disease with late onset: Secondary | ICD-10-CM | POA: Diagnosis not present

## 2017-04-11 DIAGNOSIS — Z79899 Other long term (current) drug therapy: Secondary | ICD-10-CM | POA: Diagnosis not present

## 2017-04-11 DIAGNOSIS — M5003 Cervical disc disorder with myelopathy, cervicothoracic region: Secondary | ICD-10-CM | POA: Diagnosis not present

## 2017-04-11 DIAGNOSIS — M130 Polyarthritis, unspecified: Secondary | ICD-10-CM | POA: Diagnosis not present

## 2017-04-11 NOTE — Telephone Encounter (Signed)
Tele msg from [facility] Worker relayed on 04/10/2017, reported as bed may become available in June for this patient , she does qualify for placement there, so we need to keep this in mind, I will see if Tammie wants the caregiver to meet wih her, am leaving her a message about this

## 2017-04-12 DIAGNOSIS — Z853 Personal history of malignant neoplasm of breast: Secondary | ICD-10-CM | POA: Diagnosis not present

## 2017-04-12 DIAGNOSIS — I6522 Occlusion and stenosis of left carotid artery: Secondary | ICD-10-CM | POA: Diagnosis not present

## 2017-04-12 DIAGNOSIS — I10 Essential (primary) hypertension: Secondary | ICD-10-CM | POA: Diagnosis not present

## 2017-04-12 DIAGNOSIS — Z9181 History of falling: Secondary | ICD-10-CM | POA: Diagnosis not present

## 2017-04-12 DIAGNOSIS — D649 Anemia, unspecified: Secondary | ICD-10-CM | POA: Diagnosis not present

## 2017-04-12 DIAGNOSIS — M1991 Primary osteoarthritis, unspecified site: Secondary | ICD-10-CM | POA: Diagnosis not present

## 2017-04-13 NOTE — Progress Notes (Signed)
[place],  [id number]   CLINIC:  Medical Oncology/Hematology  PCP:  Fayrene Helper, MD [address], [place] [address] [phone]   REASON FOR VISIT:  Follow-up for Stage IIIB invasive ductal carcinoma of right breast, ER-/PR-/HER2+  CURRENT THERAPY: Observation    HISTORY OF PRESENT ILLNESS:  (From Dr.Penland's last note on 04/15/16)     INTERVAL HISTORY:  Ms. Chinchilla 83 y.o. female presents for follow-up of history of right breast cancer.   Chart reviewed. Since her last visit to the cancer center, she had 2 ED visits for falls. She tells me that she now has family and friends who help her daily. She requires assistance getting dressed, bathing, etc. She wears an emergency alert necklace.   She is here today with her nephew. Overall, she tells me that she feels very well. Her appetite and energy levels are both 100%. She denies any pain. She has had intermittent right LE edema with pain for the past several months.  She had doppler US in 02/2017 which was negative for DVT.   Denies any changes in her breasts or axilla; no new lumps/bumps or skin changes.  She no longer gets mammograms.  She is requesting a prescription for new mastectomy bra with prosthesis.   She remains with her port-a-cath. When asked if she would like to have it removed, she states, "No! Because I'm too old to have any surgeries." I explained to her that it is an easy procedure to have a port removed and is not major surgery, but she declines. "I don't mind coming up here every 2 months to have it flushed. It doesn't bother me."  Encouraged her to let us know if she changes her mind and we can certainly make arrangements to have her port removed if she desires.     REVIEW OF SYSTEMS:  Review of Systems  Constitutional: Negative.  Negative for chills, fatigue and fever.  HENT:  Negative.  Negative for lump/mass and nosebleeds.   Eyes:  Negative.   Respiratory: Negative.  Negative for cough and shortness of breath.   Cardiovascular: Positive for leg swelling. Negative for chest pain.  Gastrointestinal: Negative.  Negative for abdominal pain, blood in stool, constipation, diarrhea, nausea and vomiting.  Endocrine: Negative.   Genitourinary: Negative.  Negative for dysuria and hematuria.   Musculoskeletal: Negative for arthralgias.  Skin: Negative.  Negative for rash.  Neurological: Positive for extremity weakness. Negative for dizziness and headaches.  Hematological: Negative.  Negative for adenopathy. Does not bruise/bleed easily.  Psychiatric/Behavioral: Negative.  Negative for depression and sleep disturbance. The patient is not nervous/anxious.      PAST MEDICAL/SURGICAL HISTORY:  Past Medical History:  Diagnosis Date  . Allergic rhinitis   . Cancer ([facility]) 03/2003   radiation and chemo ND [facility]  . History of breast cancer   . History of chemotherapy    And radiation secondary to breast cancer  . Hyperlipidemia   . Hypertension   . IGT (impaired glucose tolerance) 2014   DIET MANAGED  . Normocytic anemia   . Osteoarthritis   . Port catheter in place 03/05/2013   Past Surgical History:  Procedure Laterality Date  . ABDOMINAL HYSTERECTOMY  1970   Fibroid tumors, benign  . BREAST SURGERY Right 05/06/2003   MRM  . MASTECTOMY  2004   Right  . PORTACATH PLACEMENT       SOCIAL HISTORY:  Social History  Social History  . Marital status: Widowed    Spouse name: N/A  . Number of children: N/A  . Years of education: N/A   Occupational History  . Homemaker Retired   Social History Main Topics  . Smoking status: Never Smoker  . Smokeless tobacco: Never Used  . Alcohol use No  . Drug use: No  . Sexual activity: Not Currently   Other Topics Concern  . Not on file   Social History Narrative   Widowed with boyfriend   Lives by herself and does own dressing and bathing   Has a caretaker who lives  with her   12th grade education    FAMILY HISTORY:  Family History  Problem Relation Age of Onset  . Stroke Mother   . Heart attack Father   . Coronary artery disease Father   . Coronary artery disease Brother   . Heart attack Sister   . Heart disease Sister   . Arthritis Sister   . Heart disease Sister   . Dementia Sister   . Lung cancer Brother   . Prostate cancer Brother     CURRENT MEDICATIONS:  Outpatient Encounter Prescriptions as of 04/14/2017  Medication Sig  . acetaminophen (TYLENOL) 500 MG tablet One tablet twice daily as needed for knee pain (Patient taking differently: Take 500 mg by mouth every 8 (eight) hours as needed for mild pain or moderate pain. )  . amLODipine (NORVASC) 10 MG tablet TAKE 1 TABLET BY MOUTH DAILY.  Marland Kitchen aspirin 81 MG tablet Take 81 mg by mouth daily.   Marland Kitchen atorvastatin (LIPITOR) 40 MG tablet   . docusate sodium (COLACE) 100 MG capsule Take 100 mg by mouth daily as needed for mild constipation.  Marland Kitchen donepezil (ARICEPT) 10 MG tablet   . feeding supplement, ENSURE COMPLETE, (ENSURE COMPLETE) LIQD Take 237 mLs by mouth 2 (two) times daily between meals. (Patient taking differently: Take 237 mLs by mouth daily. )  . Incontinence Supply Disposable (DISPOSABLE BRIEF LARGE) MISC Use as needed  . Incontinence Supply Disposable (UNDERPADS) MISC Use as needed  . losartan (COZAAR) 100 MG tablet TAKE 1 TABLET BY MOUTH ONCE A DAY.  . Multiple Vitamin (MULTIVITAMIN WITH MINERALS) TABS tablet Take 1 tablet by mouth daily.  . simvastatin (ZOCOR) 40 MG tablet Take 40 mg by mouth daily.  . traZODone (DESYREL) 50 MG tablet Half tablet at bedtime, as needed,  . UNABLE TO FIND Bed alarm dx- R29.6  . Vitamin D, Ergocalciferol, (DRISDOL) 50000 units CAPS capsule Take 1 capsule (50,000 Units total) by mouth every 7 (seven) days. (Patient taking differently: Take 50,000 Units by mouth every 7 (seven) days. Take on wednesday)  . zolpidem (AMBIEN) 5 MG tablet Take 1 tablet by  mouth daily.   Facility-Administered Encounter Medications as of 04/14/2017  Medication  . [COMPLETED] heparin lock flush 100 unit/mL  . sodium chloride 0.9 % injection 10 mL  . [DISCONTINUED] sodium chloride flush (NS) 0.9 % injection 10 mL    ALLERGIES:  Allergies  Allergen Reactions  . Aricept [Donepezil Hcl]     Hair loss      PHYSICAL EXAM:  ECOG Performance status: 2-3 - Requires considerable assistance daily.   Vitals:   04/14/17 0954  BP: (!) 173/67  Pulse: 76  Resp: 16  Temp: 98.3 F (36.8 C)   Filed Weights   04/14/17 0954  Weight: 145 lb (65.8 kg)    Physical Exam  Constitutional: She is oriented to person, place, and  time and well-developed, well-nourished, and in no distress.  Patient examined in wheelchair. States she is unable to get onto exam table.  -I assisted patient getting undressed in order to perform breast exam with her seated in wheelchair.   HENT:  Head: Normocephalic.  Mouth/Throat: Oropharynx is clear and moist. No oropharyngeal exudate.  Eyes: Conjunctivae are normal. Pupils are equal, round, and reactive to light. No scleral icterus.  Neck: Normal range of motion. Neck supple.  Cardiovascular: Normal rate and regular rhythm.   Pulmonary/Chest: Effort normal and breath sounds normal. No respiratory distress.    Abdominal: Soft. Bowel sounds are normal. There is no tenderness.  Musculoskeletal: Normal range of motion. She exhibits edema (1+ RLE/ankle edema ).  Lymphadenopathy:    She has no cervical adenopathy.       Right: No supraclavicular adenopathy present.       Left: No supraclavicular adenopathy present.  Neurological: She is alert and oriented to person, place, and time. No cranial nerve deficit.  Skin: Skin is warm and dry. No rash noted.  Psychiatric: Mood, memory, affect and judgment normal.  Nursing note and vitals reviewed.    LABORATORY DATA:  I have reviewed the labs as listed.  CBC    Component Value Date/Time     WBC 8.0 04/14/2017 0958   RBC 4.44 04/14/2017 0958   HGB 11.4 (L) 04/14/2017 0958   HCT 35.9 (L) 04/14/2017 0958   PLT 306 04/14/2017 0958   MCV 80.9 04/14/2017 0958   MCH 25.7 (L) 04/14/2017 0958   MCHC 31.8 04/14/2017 0958   RDW 14.0 04/14/2017 0958   LYMPHSABS 1.9 04/14/2017 0958   MONOABS 0.4 04/14/2017 0958   EOSABS 0.3 04/14/2017 0958   BASOSABS 0.1 04/14/2017 0958   CMP Latest Ref Rng & Units 04/14/2017 01/23/2017 01/03/2017  Glucose 65 - 99 mg/dL 112(H) 106(H) 117(H)  BUN 6 - 20 mg/dL _0 Creatinine 0.44 - 1.00 mg/dL 0.89 1.01(H) 1.03(H)  Sodium 135 - 145 mmol/L 135 133(L) 136  Potassium 3.5 - 5.1 mmol/L 3.5 4.0 4.1  Chloride 101 - 111 mmol/L 99(L) 102 103  CO2 22 - 32 mmol/L _1 Calcium 8.9 - 10.3 mg/dL 9.5 9.4 9.9  Total Protein 6.1 - 8.1 g/dL - - -  Total Bilirubin 0.2 - 1.2 mg/dL - - -  Alkaline Phos 33 - 130 U/L - - -  AST 10 - 35 U/L - - -  ALT 6 - 29 U/L - - -    PENDING LABS:    DIAGNOSTIC IMAGING:    PATHOLOGY:     ASSESSMENT & PLAN:   Stage IIIB invasive ductal carcinoma of right breast, ER-/PR-/HER2+:  -Diagnosed in 04/2003. Treated with right mastectomy, followed by Adriamycin/Cytoxan x 6 cycles, followed by Taxotere x 4 cycles, then Herceptin x 48 weeks.  -She no longer gets mammograms.  -Clinical breast exam completed today and negative.  -Return to cancer center in 1 year for continued surveillance. Encouraged her to call if she has any new concerns or symptoms and we would be happy to see her sooner, if needed.       Dispo:  -Return to cancer center in 1 year for continued follow-up.    All questions were answered to patient's stated satisfaction. Encouraged patient to call with any new concerns or questions before her next visit to the cancer center and we can certain see her sooner, if needed.    Plan of care discussed with  Dr. Talbert Cage, who agrees with the above aforementioned.    Orders placed this encounter:  Orders  Placed This Encounter  Procedures  . Basic metabolic panel  . CBC with Differential/Platelet      Mike Craze, NP [place] [phone]

## 2017-04-14 ENCOUNTER — Ambulatory Visit (HOSPITAL_COMMUNITY): Payer: Medicare Other | Admitting: Hematology & Oncology

## 2017-04-14 ENCOUNTER — Encounter (HOSPITAL_BASED_OUTPATIENT_CLINIC_OR_DEPARTMENT_OTHER): Payer: Medicare Other

## 2017-04-14 ENCOUNTER — Encounter (HOSPITAL_COMMUNITY): Payer: Medicare Other | Attending: Hematology and Oncology | Admitting: Adult Health

## 2017-04-14 ENCOUNTER — Encounter (HOSPITAL_COMMUNITY): Payer: Self-pay | Admitting: Adult Health

## 2017-04-14 VITALS — BP 173/67 | HR 76 | Temp 98.3°F | Resp 16 | Ht 62.0 in | Wt 145.0 lb

## 2017-04-14 DIAGNOSIS — Z8249 Family history of ischemic heart disease and other diseases of the circulatory system: Secondary | ICD-10-CM | POA: Insufficient documentation

## 2017-04-14 DIAGNOSIS — Z8042 Family history of malignant neoplasm of prostate: Secondary | ICD-10-CM | POA: Diagnosis not present

## 2017-04-14 DIAGNOSIS — Z801 Family history of malignant neoplasm of trachea, bronchus and lung: Secondary | ICD-10-CM | POA: Diagnosis not present

## 2017-04-14 DIAGNOSIS — Z95828 Presence of other vascular implants and grafts: Secondary | ICD-10-CM | POA: Diagnosis not present

## 2017-04-14 DIAGNOSIS — Z9221 Personal history of antineoplastic chemotherapy: Secondary | ICD-10-CM | POA: Insufficient documentation

## 2017-04-14 DIAGNOSIS — Z923 Personal history of irradiation: Secondary | ICD-10-CM | POA: Insufficient documentation

## 2017-04-14 DIAGNOSIS — Z9889 Other specified postprocedural states: Secondary | ICD-10-CM | POA: Diagnosis not present

## 2017-04-14 DIAGNOSIS — Z9011 Acquired absence of right breast and nipple: Secondary | ICD-10-CM | POA: Insufficient documentation

## 2017-04-14 DIAGNOSIS — Z823 Family history of stroke: Secondary | ICD-10-CM | POA: Diagnosis not present

## 2017-04-14 DIAGNOSIS — Z452 Encounter for adjustment and management of vascular access device: Secondary | ICD-10-CM | POA: Diagnosis present

## 2017-04-14 DIAGNOSIS — C50911 Malignant neoplasm of unspecified site of right female breast: Secondary | ICD-10-CM

## 2017-04-14 DIAGNOSIS — Z853 Personal history of malignant neoplasm of breast: Secondary | ICD-10-CM | POA: Insufficient documentation

## 2017-04-14 DIAGNOSIS — Z7982 Long term (current) use of aspirin: Secondary | ICD-10-CM | POA: Insufficient documentation

## 2017-04-14 DIAGNOSIS — Z90710 Acquired absence of both cervix and uterus: Secondary | ICD-10-CM | POA: Diagnosis not present

## 2017-04-14 DIAGNOSIS — Z79899 Other long term (current) drug therapy: Secondary | ICD-10-CM | POA: Diagnosis not present

## 2017-04-14 LAB — BASIC METABOLIC PANEL
Anion gap: 8 (ref 5–15)
BUN: 12 mg/dL (ref 6–20)
CALCIUM: 9.5 mg/dL (ref 8.9–10.3)
CO2: 28 mmol/L (ref 22–32)
Chloride: 99 mmol/L — ABNORMAL LOW (ref 101–111)
Creatinine, Ser: 0.89 mg/dL (ref 0.44–1.00)
GFR calc Af Amer: >60 mL/min (ref >60)
GFR, EST NON AFRICAN AMERICAN: 58 mL/min — AB (ref >60)
GLUCOSE: 112 mg/dL — AB (ref 65–99)
Potassium: 3.5 mmol/L (ref 3.5–5.1)
Sodium: 135 mmol/L (ref 135–145)

## 2017-04-14 LAB — CBC WITH DIFFERENTIAL/PLATELET
BASOS ABS: 0.1 10*3/uL (ref 0.0–0.1)
BASOS PCT: 1 %
EOS PCT: 4 %
Eosinophils Absolute: 0.3 10*3/uL (ref 0.0–0.7)
HCT: 35.9 % — ABNORMAL LOW (ref 36.0–46.0)
Hemoglobin: 11.4 g/dL — ABNORMAL LOW (ref 12.0–15.0)
LYMPHS PCT: 24 %
Lymphs Abs: 1.9 10*3/uL (ref 0.7–4.0)
MCH: 25.7 pg — ABNORMAL LOW (ref 26.0–34.0)
MCHC: 31.8 g/dL (ref 30.0–36.0)
MCV: 80.9 fL (ref 78.0–100.0)
MONO ABS: 0.4 10*3/uL (ref 0.1–1.0)
Monocytes Relative: 5 %
Neutro Abs: 5.3 10*3/uL (ref 1.7–7.7)
Neutrophils Relative %: 66 %
PLATELETS: 306 10*3/uL (ref 150–400)
RBC: 4.44 MIL/uL (ref 3.87–5.11)
RDW: 14 % (ref 11.5–15.5)
WBC: 8 10*3/uL (ref 4.0–10.5)

## 2017-04-14 MED ORDER — HEPARIN SOD (PORK) LOCK FLUSH 100 UNIT/ML IV SOLN
500.0000 [IU] | Freq: Once | INTRAVENOUS | Status: AC
  Administered 2017-04-14: 500 [IU] via INTRAVENOUS
  Filled 2017-04-14: qty 5

## 2017-04-14 MED ORDER — SODIUM CHLORIDE 0.9% FLUSH
10.0000 mL | INTRAVENOUS | Status: DC | PRN
  Administered 2017-04-14: 10 mL via INTRAVENOUS
  Filled 2017-04-14: qty 10

## 2017-04-14 NOTE — Patient Instructions (Signed)
[place] at [facility] Discharge Instructions  RECOMMENDATIONS MADE BY THE CONSULTANT AND ANY TEST RESULTS WILL BE SENT TO YOUR REFERRING PHYSICIAN.  You were seen today by Mike Craze NP. Continue port flushes every 8 weeks. Return in 1 year for port flush and follow up.   Thank you for choosing [place] at [facility] to provide your oncology and hematology care.  To afford each patient quality time with our provider, please arrive at least 15 minutes before your scheduled appointment time.    If you have a lab appointment with the [facility] please come in thru the  Main Entrance and check in at the main information desk  You need to re-schedule your appointment should you arrive 10 or more minutes late.  We strive to give you quality time with our providers, and arriving late affects you and other patients whose appointments are after yours.  Also, if you no show three or more times for appointments you may be dismissed from the clinic at the providers discretion.     Again, thank you for choosing [facility].  Our hope is that these requests will decrease the amount of time that you wait before being seen by our physicians.       _____________________________________________________________  Should you have questions after your visit to [facility], please contact our office at [phone] between the hours of 8:30 a.m. and 4:30 p.m.  Voicemails left after 4:30 p.m. will not be returned until the following business day.  For prescription refill requests, have your pharmacy contact our office.       Resources For Cancer Patients and their Caregivers ? American Cancer Society: Can assist with transportation, wigs, general needs, runs Look Good Feel Better.        786-398-4853 ? Cancer Care: Provides financial assistance, online support groups, medication/co-pay assistance.  1-800-813-HOPE  920-717-2482) ? [place] Assists [place] cancer patients and their families through emotional , educational and financial support.  205-300-7153 ? [place] DSS Where to apply for food stamps, Medicaid and utility assistance. (417)388-2522 ? [employer]ATS: Transportation to medical appointments. (737) 403-1958 ? Social Security Administration: May apply for disability if have a Stage IV cancer. 787-277-2924 (959)692-6327 ? [employer], Disability and Transit Services: Assists with nutrition, care and transit needs. [place] Support Programs: @10RELATIVEDAYS @ > Cancer Support Group  2nd Tuesday of the month 1pm-2pm, Journey Room  > Creative Journey  3rd Tuesday of the month 1130am-1pm, Journey Room  > Look Good Feel Better  1st Wednesday of the month 10am-12 noon, Journey Room (Call [place] to register [phone])

## 2017-04-14 NOTE — Telephone Encounter (Signed)
fl2 complete

## 2017-04-14 NOTE — Progress Notes (Signed)
[facility] presented for Portacath access and flush. Portacath located left chest wall accessed with  H 20 needle. No blood return and no resistance met  Portacath flushed with 20ml NS and 500U/5ml Heparin and needle removed intact. Procedure without incident. Patient tolerated procedure well.  Venipuncture done for labs   

## 2017-04-14 NOTE — Patient Instructions (Signed)
[place] at [facility] Discharge Instructions  RECOMMENDATIONS MADE BY THE CONSULTANT AND ANY TEST RESULTS WILL BE SENT TO YOUR REFERRING PHYSICIAN.  Port flush Labs done  Follow up as scheduled.  Thank you for choosing [place] at [facility] to provide your oncology and hematology care.  To afford each patient quality time with our provider, please arrive at least 15 minutes before your scheduled appointment time.    If you have a lab appointment with the [facility] please come in thru the  Main Entrance and check in at the main information desk  You need to re-schedule your appointment should you arrive 10 or more minutes late.  We strive to give you quality time with our providers, and arriving late affects you and other patients whose appointments are after yours.  Also, if you no show three or more times for appointments you may be dismissed from the clinic at the providers discretion.     Again, thank you for choosing [facility].  Our hope is that these requests will decrease the amount of time that you wait before being seen by our physicians.       _____________________________________________________________  Should you have questions after your visit to [facility], please contact our office at [phone] between the hours of 8:30 a.m. and 4:30 p.m.  Voicemails left after 4:30 p.m. will not be returned until the following business day.  For prescription refill requests, have your pharmacy contact our office.       Resources For Cancer Patients and their Caregivers ? American Cancer Society: Can assist with transportation, wigs, general needs, runs Look Good Feel Better.        603-651-7384 ? Cancer Care: Provides financial assistance, online support groups, medication/co-pay assistance.  1-800-813-HOPE 229-696-7648) ? [place] Assists [place] cancer patients and  their families through emotional , educational and financial support.  (872)247-8647 ? [place] DSS Where to apply for food stamps, Medicaid and utility assistance. (820)673-0602 ? [employer]ATS: Transportation to medical appointments. (279)347-7872 ? Social Security Administration: May apply for disability if have a Stage IV cancer. 959-409-4485 713-185-9856 ? [employer], Disability and Transit Services: Assists with nutrition, care and transit needs. [place] Support Programs: @10RELATIVEDAYS @ > Cancer Support Group  2nd Tuesday of the month 1pm-2pm, Journey Room  > Creative Journey  3rd Tuesday of the month 1130am-1pm, Journey Room  > Look Good Feel Better  1st Wednesday of the month 10am-12 noon, Journey Room (Call [place] to register [phone])

## 2017-04-19 DIAGNOSIS — D649 Anemia, unspecified: Secondary | ICD-10-CM | POA: Diagnosis not present

## 2017-04-19 DIAGNOSIS — I10 Essential (primary) hypertension: Secondary | ICD-10-CM | POA: Diagnosis not present

## 2017-04-19 DIAGNOSIS — M1991 Primary osteoarthritis, unspecified site: Secondary | ICD-10-CM | POA: Diagnosis not present

## 2017-04-19 DIAGNOSIS — Z853 Personal history of malignant neoplasm of breast: Secondary | ICD-10-CM | POA: Diagnosis not present

## 2017-04-19 DIAGNOSIS — I6522 Occlusion and stenosis of left carotid artery: Secondary | ICD-10-CM | POA: Diagnosis not present

## 2017-04-19 DIAGNOSIS — Z9181 History of falling: Secondary | ICD-10-CM | POA: Diagnosis not present

## 2017-04-21 DIAGNOSIS — I6522 Occlusion and stenosis of left carotid artery: Secondary | ICD-10-CM | POA: Diagnosis not present

## 2017-04-21 DIAGNOSIS — D649 Anemia, unspecified: Secondary | ICD-10-CM | POA: Diagnosis not present

## 2017-04-21 DIAGNOSIS — M1991 Primary osteoarthritis, unspecified site: Secondary | ICD-10-CM | POA: Diagnosis not present

## 2017-04-21 DIAGNOSIS — Z9181 History of falling: Secondary | ICD-10-CM | POA: Diagnosis not present

## 2017-04-21 DIAGNOSIS — Z853 Personal history of malignant neoplasm of breast: Secondary | ICD-10-CM | POA: Diagnosis not present

## 2017-04-21 DIAGNOSIS — I10 Essential (primary) hypertension: Secondary | ICD-10-CM | POA: Diagnosis not present

## 2017-04-28 ENCOUNTER — Emergency Department (HOSPITAL_COMMUNITY): Payer: Medicare Other

## 2017-04-28 ENCOUNTER — Emergency Department (HOSPITAL_COMMUNITY)
Admission: EM | Admit: 2017-04-28 | Discharge: 2017-04-28 | Disposition: A | Discharge status: Home / Self Care | Payer: Medicare Other | Attending: Emergency Medicine | Admitting: Emergency Medicine

## 2017-04-28 ENCOUNTER — Encounter (HOSPITAL_COMMUNITY): Payer: Self-pay | Admitting: Emergency Medicine

## 2017-04-28 DIAGNOSIS — Z7982 Long term (current) use of aspirin: Secondary | ICD-10-CM | POA: Insufficient documentation

## 2017-04-28 DIAGNOSIS — Z853 Personal history of malignant neoplasm of breast: Secondary | ICD-10-CM | POA: Insufficient documentation

## 2017-04-28 DIAGNOSIS — R569 Unspecified convulsions: Secondary | ICD-10-CM | POA: Insufficient documentation

## 2017-04-28 DIAGNOSIS — T148XXA Other injury of unspecified body region, initial encounter: Secondary | ICD-10-CM | POA: Diagnosis not present

## 2017-04-28 DIAGNOSIS — Y999 Unspecified external cause status: Secondary | ICD-10-CM | POA: Insufficient documentation

## 2017-04-28 DIAGNOSIS — I10 Essential (primary) hypertension: Secondary | ICD-10-CM | POA: Insufficient documentation

## 2017-04-28 DIAGNOSIS — Z79899 Other long term (current) drug therapy: Secondary | ICD-10-CM | POA: Diagnosis not present

## 2017-04-28 DIAGNOSIS — M25569 Pain in unspecified knee: Secondary | ICD-10-CM | POA: Diagnosis not present

## 2017-04-28 DIAGNOSIS — W01198A Fall on same level from slipping, tripping and stumbling with subsequent striking against other object, initial encounter: Secondary | ICD-10-CM | POA: Insufficient documentation

## 2017-04-28 DIAGNOSIS — Y929 Unspecified place or not applicable: Secondary | ICD-10-CM | POA: Insufficient documentation

## 2017-04-28 DIAGNOSIS — S8991XA Unspecified injury of right lower leg, initial encounter: Secondary | ICD-10-CM | POA: Diagnosis present

## 2017-04-28 DIAGNOSIS — S0990XA Unspecified injury of head, initial encounter: Secondary | ICD-10-CM | POA: Diagnosis not present

## 2017-04-28 DIAGNOSIS — Y939 Activity, unspecified: Secondary | ICD-10-CM | POA: Diagnosis not present

## 2017-04-28 DIAGNOSIS — M25561 Pain in right knee: Secondary | ICD-10-CM

## 2017-04-28 HISTORY — DX: Dementia in other diseases classified elsewhere, unspecified severity, without behavioral disturbance, psychotic disturbance, mood disturbance, and anxiety: F02.80

## 2017-04-28 HISTORY — DX: Alzheimer's disease, unspecified: G30.9

## 2017-04-28 LAB — BASIC METABOLIC PANEL
Anion gap: 9 (ref 5–15)
BUN: 13 mg/dL (ref 6–20)
CO2: 27 mmol/L (ref 22–32)
CREATININE: 0.9 mg/dL (ref 0.44–1.00)
Calcium: 9.6 mg/dL (ref 8.9–10.3)
Chloride: 100 mmol/L — ABNORMAL LOW (ref 101–111)
GFR calc Af Amer: >60 mL/min (ref >60)
GFR, EST NON AFRICAN AMERICAN: 58 mL/min — AB (ref >60)
GLUCOSE: 109 mg/dL — AB (ref 65–99)
POTASSIUM: 4.3 mmol/L (ref 3.5–5.1)
Sodium: 136 mmol/L (ref 135–145)

## 2017-04-28 LAB — CBC WITH DIFFERENTIAL/PLATELET
BASOS ABS: 0.1 10*3/uL (ref 0.0–0.1)
Basophils Relative: 1 %
EOS ABS: 0.1 10*3/uL (ref 0.0–0.7)
EOS PCT: 1 %
HCT: 35.6 % — ABNORMAL LOW (ref 36.0–46.0)
Hemoglobin: 11.4 g/dL — ABNORMAL LOW (ref 12.0–15.0)
LYMPHS ABS: 1.7 10*3/uL (ref 0.7–4.0)
Lymphocytes Relative: 16 %
MCH: 25.9 pg — AB (ref 26.0–34.0)
MCHC: 32 g/dL (ref 30.0–36.0)
MCV: 80.7 fL (ref 78.0–100.0)
MONO ABS: 0.4 10*3/uL (ref 0.1–1.0)
Monocytes Relative: 4 %
Neutro Abs: 8.5 10*3/uL — ABNORMAL HIGH (ref 1.7–7.7)
Neutrophils Relative %: 78 %
PLATELETS: 267 10*3/uL (ref 150–400)
RBC: 4.41 MIL/uL (ref 3.87–5.11)
RDW: 14.1 % (ref 11.5–15.5)
WBC: 10.7 10*3/uL — AB (ref 4.0–10.5)

## 2017-04-28 NOTE — ED Triage Notes (Signed)
Pt putting on underwear and fell onto left knee. Pt states "it only hurts a little bit" . Pt has been walking since fall. A/o to most. No deformity ntoed.

## 2017-04-28 NOTE — ED Notes (Addendum)
Pt family states that pt she fell earlier this morning with the home health aid.  Her niece then came for a visit and she was sitting in the recliner.  Niece states that her eyes "rolled back in her head', she broke out in a sweat, her mouth was "all twisted", and she would not respond to her name.  Pts daughter states that pt has been diagnosed with late stage alzheimer's and she only ever refers to knee pain with pain.  This pain description and location occurs at PCP appointments as well.  Notified EDP.

## 2017-04-28 NOTE — Discharge Instructions (Addendum)
X-ray of your knee showed no fractures. Ice, elevate, Ace wrap, Tylenol.  Blood work and CT head were normal.

## 2017-04-28 NOTE — ED Provider Notes (Addendum)
[facility] DEPT Provider Note   CSN: [id number] Arrival date & time: 04/28/17  1403     History   Chief Complaint Chief Complaint  Patient presents with  . Knee Pain  . Fall    HPI Emma Dawson is a 83 y.o. female.  Level V caveat for dementia. Accidental trip and fall today striking her right knee. She walks at home with a walker. No head or neck trauma. No extremity or truncal injury. Severity of pain is mild.  On further discussion with daughter, she reports patient had a seizure-like activity earlier today. She described her eyes rolling to the back of her head, sweating, facial grimacing. Symptoms lasted 5-6 minutes and patient was postictal afterwards. She reports normal behavior now. No previous history of seizure.      Past Medical History:  Diagnosis Date  . Allergic rhinitis   . Alzheimer's dementia   . Cancer ([place]) 03/2003   radiation and chemo ND mrm  . History of breast cancer   . History of chemotherapy    And radiation secondary to breast cancer  . Hyperlipidemia   . Hypertension   . IGT (impaired glucose tolerance) 2014   DIET MANAGED  . Normocytic anemia   . Osteoarthritis   . Port catheter in place 03/05/2013    Patient Active Problem List   Diagnosis Date Noted  . Incontinence in female 01/26/2017  . Knee pain, bilateral 12/16/2016  . Need for assistance due to unsteady gait 12/16/2016  . Recurrent falls while walking 12/16/2016  . Memory loss 06/02/2015  . Personal history of noncompliance with medical treatment, presenting hazards to health 09/27/2013  . Genital herpes 05/02/2008  . CAROTID ARTERY STENOSIS, LEFT 05/02/2008  . SYNCOPE, VASOVAGAL 05/02/2008  . CARDIAC MURMUR 05/02/2008  . Anemia 10/03/2007  . Hyperlipidemia LDL goal <100 09/27/2006  . Essential hypertension 09/27/2006  . Allergic rhinitis 09/27/2006  . Osteoarthritis 09/27/2006  . Invasive ductal carcinoma of breast ([place]) 09/27/2006    Past Surgical History:   Procedure Laterality Date  . ABDOMINAL HYSTERECTOMY  1970   Fibroid tumors, benign  . BREAST SURGERY Right 05/06/2003   MRM  . MASTECTOMY  2004   Right  . PORTACATH PLACEMENT      OB History    No data available       Home Medications    Prior to Admission medications   Medication Sig Start Date End Date Taking? Authorizing Provider  acetaminophen (TYLENOL) 500 MG tablet One tablet twice daily as needed for knee pain Patient taking differently: Take 500 mg by mouth every 8 (eight) hours as needed for mild pain or moderate pain.  05/08/12   Fayrene Helper, MD  amLODipine (NORVASC) 10 MG tablet TAKE 1 TABLET BY MOUTH DAILY. 01/17/17   Fayrene Helper, MD  aspirin 81 MG tablet Take 81 mg by mouth daily.     [provider]  atorvastatin (LIPITOR) 40 MG tablet  04/11/17   [provider]  docusate sodium (COLACE) 100 MG capsule Take 100 mg by mouth daily as needed for mild constipation.    [provider]  donepezil (ARICEPT) 10 MG tablet  04/11/17   [provider]  feeding supplement, ENSURE COMPLETE, (ENSURE COMPLETE) LIQD Take 237 mLs by mouth 2 (two) times daily between meals. Patient taking differently: Take 237 mLs by mouth daily.  01/25/17   Fayrene Helper, MD  Incontinence Supply Disposable (DISPOSABLE BRIEF LARGE) MISC Use as needed 03/17/17  Fayrene Helper, MD  Incontinence Supply Disposable [facility]) MISC Use as needed 03/17/17   Fayrene Helper, MD  losartan (COZAAR) 100 MG tablet TAKE 1 TABLET BY MOUTH ONCE A DAY. 01/17/17   Fayrene Helper, MD  Multiple Vitamin (MULTIVITAMIN WITH MINERALS) TABS tablet Take 1 tablet by mouth daily.    [provider]  simvastatin (ZOCOR) 40 MG tablet Take 40 mg by mouth daily.    [provider]  traZODone (DESYREL) 50 MG tablet Half tablet at bedtime, as needed, 02/09/17   Fayrene Helper, MD  UNABLE TO FIND Bed alarm dx- R29.6 01/25/17   Fayrene Helper,  MD  Vitamin D, Ergocalciferol, (DRISDOL) 50000 units CAPS capsule Take 1 capsule (50,000 Units total) by mouth every 7 (seven) days. Patient taking differently: Take 50,000 Units by mouth every 7 (seven) days. Take on wednesday 12/20/16   Fayrene Helper, MD  zolpidem (AMBIEN) 5 MG tablet Take 1 tablet by mouth daily. 02/28/17   [provider]    Family History Family History  Problem Relation Age of Onset  . Stroke Mother   . Heart attack Father   . Coronary artery disease Father   . Coronary artery disease Brother   . Heart attack Sister   . Heart disease Sister   . Arthritis Sister   . Heart disease Sister   . Dementia Sister   . Lung cancer Brother   . Prostate cancer Brother     Social History Social History  Substance Use Topics  . Smoking status: Never Smoker  . Smokeless tobacco: Never Used  . Alcohol use No     Allergies   Aricept [donepezil hcl]   Review of Systems Review of Systems  Reason unable to perform ROS: Dementia.     Physical Exam Updated Vital Signs BP (!) 148/64 (BP Location: Right Arm)   Pulse 69   Temp 98.7 F (37.1 C) (Oral)   Resp 19   SpO2 98%   Physical Exam  Constitutional: She is oriented to person, place, and time. She appears well-developed and well-nourished.  HENT:  Head: Normocephalic and atraumatic.  Eyes: Conjunctivae are normal.  Neck: Neck supple.  Musculoskeletal:  Right knee: Minimal tenderness on the anterior aspect. Minimal pain with range of motion. No edema.  Neurological: She is alert and oriented to person, place, and time.  Skin: Skin is warm and dry.  Psychiatric: She has a normal mood and affect. Her behavior is normal.  Nursing note and vitals reviewed.    ED Treatments / Results  Labs (all labs ordered are listed, but only abnormal results are displayed) Labs Reviewed  CBC WITH DIFFERENTIAL/PLATELET - Abnormal; Notable for the following:       Result Value   WBC 10.7 (*)     Hemoglobin 11.4 (*)    HCT 35.6 (*)    MCH 25.9 (*)    Neutro Abs 8.5 (*)    All other components within normal limits  BASIC METABOLIC PANEL - Abnormal; Notable for the following:    Chloride 100 (*)    Glucose, Bld 109 (*)    GFR calc non Af Amer 58 (*)    All other components within normal limits    EKG  EKG Interpretation None      Date: 04/28/2017  Rate: 75  Rhythm: normal sinus rhythm c SA  QRS Axis: normal  Intervals: normal  ST/T Wave abnormalities: normal  Conduction Disutrbances: none  Narrative Interpretation:  unremarkable     Radiology Ct Head Wo Contrast  Result Date: 04/28/2017 CLINICAL DATA:  Fall, possible seizure EXAM: CT HEAD WITHOUT CONTRAST TECHNIQUE: Contiguous axial images were obtained from the base of the skull through the vertex without intravenous contrast. COMPARISON:  01/23/2017, 01/03/2017 FINDINGS: Brain: No acute territorial infarction, hemorrhage, or intracranial mass is seen. Scattered periventricular white matter hypodensity most consistent with small vessel ischemic changes. Enlarged ventricles but similar compared to the previous exam. Moderate atrophy atrophy. Vascular: No hyperdense vessels.  Carotid artery calcifications. Skull: Normal. Negative for fracture or focal lesion. Sinuses/Orbits: No acute finding. Other: None IMPRESSION: No definite CT evidence for acute intracranial abnormality. Stable atrophy and small vessel ischemic changes of the white matter. Electronically Signed   By: Donavan Foil M.D.   On: 04/28/2017 17:59   Dg Knee Complete 4 Views Right  Result Date: 04/28/2017 CLINICAL DATA:  Right knee pain following a fall today. EXAM: RIGHT KNEE - COMPLETE 4+ VIEW COMPARISON:  01/03/2017. FINDINGS: Minimal medial spur formation. Mild anterior, superior patellar spur formation at the site of quadriceps tendon insertion. No fracture, dislocation or effusion. Atheromatous arterial calcifications. IMPRESSION: No fracture or effusion.  Electronically Signed   By: Claudie Revering M.D.   On: 04/28/2017 15:15    Procedures Procedures (including critical care time)  Medications Ordered in ED Medications - No data to display   Initial Impression / Assessment and Plan / ED Course  I have reviewed the triage vital signs and the nursing notes.  Pertinent labs & imaging results that were available during my care of the patient were reviewed by me and considered in my medical decision making (see chart for details).     Patient is in no acute distress. Films of right knee are negative for fracture or effusion. RICE.  Will workup the patient for seizure-like activity with CT scan of head and labs.   1920: Patient was observed in the emergency department for 5 hours. No evidence of a seizure was noted. Test results were discussed with the patient and her daughters. Final Clinical Impressions(s) / ED Diagnoses   Final diagnoses:  Acute pain of right knee  Seizure-like activity [facility])    New Prescriptions New Prescriptions   No medications on file     Nat Christen, MD 04/28/17 1533    Nat Christen, MD 04/28/17 [phone]

## 2017-05-02 DIAGNOSIS — Z9181 History of falling: Secondary | ICD-10-CM | POA: Diagnosis not present

## 2017-05-02 DIAGNOSIS — M1991 Primary osteoarthritis, unspecified site: Secondary | ICD-10-CM | POA: Diagnosis not present

## 2017-05-02 DIAGNOSIS — I10 Essential (primary) hypertension: Secondary | ICD-10-CM | POA: Diagnosis not present

## 2017-05-02 DIAGNOSIS — I6522 Occlusion and stenosis of left carotid artery: Secondary | ICD-10-CM | POA: Diagnosis not present

## 2017-05-02 DIAGNOSIS — Z853 Personal history of malignant neoplasm of breast: Secondary | ICD-10-CM | POA: Diagnosis not present

## 2017-05-02 DIAGNOSIS — D649 Anemia, unspecified: Secondary | ICD-10-CM | POA: Diagnosis not present

## 2017-05-03 MED ORDER — IOPAMIDOL (ISOVUE-300) INJECTION 61%
INTRAVENOUS | Status: AC
  Filled 2017-05-03: qty 150

## 2017-05-09 ENCOUNTER — Other Ambulatory Visit: Payer: Self-pay | Admitting: Family Medicine

## 2017-05-25 ENCOUNTER — Other Ambulatory Visit: Payer: Self-pay | Admitting: Family Medicine

## 2017-06-09 ENCOUNTER — Encounter (HOSPITAL_COMMUNITY): Payer: Medicare Other

## 2017-06-16 ENCOUNTER — Telehealth: Payer: Self-pay | Admitting: Family Medicine

## 2017-06-16 DIAGNOSIS — R413 Other amnesia: Secondary | ICD-10-CM

## 2017-06-16 DIAGNOSIS — R32 Unspecified urinary incontinence: Secondary | ICD-10-CM

## 2017-06-16 DIAGNOSIS — R2689 Other abnormalities of gait and mobility: Secondary | ICD-10-CM

## 2017-06-16 NOTE — Telephone Encounter (Signed)
Received fax for home health services and supplies. Ordered per Dr Moshe Cipro

## 2017-06-22 ENCOUNTER — Other Ambulatory Visit: Payer: Self-pay | Admitting: Family Medicine

## 2017-06-29 ENCOUNTER — Other Ambulatory Visit: Payer: Self-pay

## 2017-06-29 ENCOUNTER — Encounter (HOSPITAL_COMMUNITY): Payer: Medicare Other

## 2017-06-29 ENCOUNTER — Ambulatory Visit: Payer: Medicare Other | Admitting: Family Medicine

## 2017-06-29 MED ORDER — UNABLE TO FIND: 11 refills | Status: DC

## 2017-07-12 DIAGNOSIS — R2689 Other abnormalities of gait and mobility: Secondary | ICD-10-CM | POA: Diagnosis not present

## 2017-07-12 DIAGNOSIS — M130 Polyarthritis, unspecified: Secondary | ICD-10-CM | POA: Diagnosis not present

## 2017-07-12 DIAGNOSIS — M5003 Cervical disc disorder with myelopathy, cervicothoracic region: Secondary | ICD-10-CM | POA: Diagnosis not present

## 2017-07-12 DIAGNOSIS — G4701 Insomnia due to medical condition: Secondary | ICD-10-CM | POA: Diagnosis not present

## 2017-07-12 DIAGNOSIS — G301 Alzheimer's disease with late onset: Secondary | ICD-10-CM | POA: Diagnosis not present

## 2017-07-18 ENCOUNTER — Other Ambulatory Visit: Payer: Self-pay | Admitting: Family Medicine

## 2017-07-31 ENCOUNTER — Ambulatory Visit: Payer: Medicare Other | Admitting: Family Medicine

## 2017-08-08 ENCOUNTER — Encounter (HOSPITAL_COMMUNITY): Payer: Medicare Other

## 2017-08-16 ENCOUNTER — Encounter (HOSPITAL_COMMUNITY): Payer: Self-pay

## 2017-08-16 ENCOUNTER — Encounter (HOSPITAL_COMMUNITY): Payer: Medicare Other | Attending: Oncology

## 2017-08-16 VITALS — BP 126/44 | HR 70 | Temp 98.6°F | Resp 18

## 2017-08-16 DIAGNOSIS — Z452 Encounter for adjustment and management of vascular access device: Secondary | ICD-10-CM

## 2017-08-16 DIAGNOSIS — Z853 Personal history of malignant neoplasm of breast: Secondary | ICD-10-CM

## 2017-08-16 DIAGNOSIS — Z95828 Presence of other vascular implants and grafts: Secondary | ICD-10-CM

## 2017-08-16 MED ORDER — HEPARIN SOD (PORK) LOCK FLUSH 100 UNIT/ML IV SOLN
500.0000 [IU] | Freq: Once | INTRAVENOUS | Status: AC
  Administered 2017-08-16: 500 [IU] via INTRAVENOUS

## 2017-08-16 MED ORDER — SODIUM CHLORIDE 0.9% FLUSH
10.0000 mL | INTRAVENOUS | Status: DC | PRN
  Administered 2017-08-16: 10 mL via INTRAVENOUS
  Filled 2017-08-16: qty 10

## 2017-08-16 NOTE — Patient Instructions (Signed)
[facility] at [facility] Discharge Instructions  RECOMMENDATIONS MADE BY THE CONSULTANT AND ANY TEST RESULTS WILL BE SENT TO YOUR REFERRING PHYSICIAN.  Portacath flushed per protocol today. Follow-up as scheduled. Call clinic for any questions or concerns  Thank you for choosing [facility] at [facility] to provide your oncology and hematology care.  To afford each patient quality time with our provider, please arrive at least 15 minutes before your scheduled appointment time.    If you have a lab appointment with the Cancer Center please come in thru the  Main Entrance and check in at the main information desk  You need to re-schedule your appointment should you arrive 10 or more minutes late.  We strive to give you quality time with our providers, and arriving late affects you and other patients whose appointments are after yours.  Also, if you no show three or more times for appointments you may be dismissed from the clinic at the providers discretion.     Again, thank you for choosing [facility].  Our hope is that these requests will decrease the amount of time that you wait before being seen by our physicians.       _____________________________________________________________  Should you have questions after your visit to  Cancer Center, please contact our office at [phone] between the hours of 8:30 a.m. and 4:30 p.m.  Voicemails left after 4:30 p.m. will not be returned until the following business day.  For prescription refill requests, have your pharmacy contact our office.       Resources For Cancer Patients and their Caregivers ? American Cancer Society: Can assist with transportation, wigs, general needs, runs Look Good Feel Better.        1-888-227-6333 ? Cancer Care: Provides financial assistance, online support groups, medication/co-pay assistance.  1-800-813-HOPE (4673) ? [facility] Assists [place] cancer patients and their families through emotional , educational and financial support.  336-427-4357 ? [place] DSS Where to apply for food stamps, Medicaid and utility assistance. 336-342-1394 ? [facility]: Transportation to medical appointments. 336-347-2287 ? Social Security Administration: May apply for disability if have a Stage IV cancer. 336-342-7796 1-800-772-1213 ? [place] Aging, Disability and Transit Services: Assists with nutrition, care and transit needs. 336-349-2343  Cancer Center Support Programs: @10RELATIVEDAYS@ > Cancer Support Group  2nd Tuesday of the month 1pm-2pm, Journey Room  > Creative Journey  3rd Tuesday of the month 1130am-1pm, Journey Room  > Look Good Feel Better  1st Wednesday of the month 10am-12 noon, Journey Room (Call American Cancer Society to register 1-800-395-5775)   

## 2017-08-16 NOTE — Progress Notes (Signed)
Emma Dawson tolerated port flush well without complaints or incident. Port accessed with 20 gauge needle without blood return noted. Pt denied any discomfort when port flushed with 10 ml NS and 5 ml Heparin easily per protocol. No swelling noted around port when flushed then de-accessed. VSS Pt discharged via wheelchair in satisfactory condition accompanied by family member

## 2017-08-18 ENCOUNTER — Other Ambulatory Visit: Payer: Self-pay | Admitting: Family Medicine

## 2017-08-31 ENCOUNTER — Ambulatory Visit: Payer: Medicare Other | Admitting: Family Medicine

## 2017-09-15 ENCOUNTER — Other Ambulatory Visit: Payer: Self-pay | Admitting: Family Medicine

## 2017-10-09 ENCOUNTER — Encounter (HOSPITAL_COMMUNITY): Payer: Self-pay

## 2017-10-09 ENCOUNTER — Ambulatory Visit (INDEPENDENT_AMBULATORY_CARE_PROVIDER_SITE_OTHER): Payer: Medicare Other | Admitting: Family Medicine

## 2017-10-09 ENCOUNTER — Encounter: Payer: Self-pay | Admitting: Family Medicine

## 2017-10-09 ENCOUNTER — Encounter (HOSPITAL_COMMUNITY): Payer: Medicare Other | Attending: Oncology

## 2017-10-09 VITALS — BP 140/64 | HR 77 | Resp 16 | Ht 62.0 in | Wt 144.0 lb

## 2017-10-09 DIAGNOSIS — E785 Hyperlipidemia, unspecified: Secondary | ICD-10-CM

## 2017-10-09 DIAGNOSIS — E559 Vitamin D deficiency, unspecified: Secondary | ICD-10-CM | POA: Diagnosis not present

## 2017-10-09 DIAGNOSIS — R296 Repeated falls: Secondary | ICD-10-CM | POA: Diagnosis not present

## 2017-10-09 DIAGNOSIS — R32 Unspecified urinary incontinence: Secondary | ICD-10-CM

## 2017-10-09 DIAGNOSIS — I10 Essential (primary) hypertension: Secondary | ICD-10-CM | POA: Diagnosis not present

## 2017-10-09 DIAGNOSIS — Z853 Personal history of malignant neoplasm of breast: Secondary | ICD-10-CM | POA: Diagnosis not present

## 2017-10-09 DIAGNOSIS — Z452 Encounter for adjustment and management of vascular access device: Secondary | ICD-10-CM | POA: Diagnosis present

## 2017-10-09 DIAGNOSIS — Z23 Encounter for immunization: Secondary | ICD-10-CM | POA: Diagnosis not present

## 2017-10-09 MED ORDER — AMLODIPINE BESYLATE 10 MG PO TABS: 10.0000 mg | ORAL_TABLET | Freq: Every day | ORAL | 3 refills | Status: DC

## 2017-10-09 MED ORDER — SODIUM CHLORIDE 0.9% FLUSH
10.0000 mL | INTRAVENOUS | Status: DC | PRN
  Administered 2017-10-09: 10 mL via INTRAVENOUS
  Filled 2017-10-09: qty 10

## 2017-10-09 MED ORDER — HEPARIN SOD (PORK) LOCK FLUSH 100 UNIT/ML IV SOLN
500.0000 [IU] | Freq: Once | INTRAVENOUS | Status: AC
  Administered 2017-10-09: 500 [IU] via INTRAVENOUS

## 2017-10-09 MED ORDER — LOSARTAN POTASSIUM 100 MG PO TABS: 100.0000 mg | ORAL_TABLET | Freq: Every day | ORAL | 3 refills | Status: DC

## 2017-10-09 MED ORDER — ATORVASTATIN CALCIUM 40 MG PO TABS: 40.0000 mg | ORAL_TABLET | Freq: Every day | ORAL | 3 refills | Status: DC

## 2017-10-09 NOTE — Progress Notes (Signed)
Emma Dawson tolerated portacath flush well without complaints or incident. Port accessed with 20 gauge needle without blood return noted but flushed easily with 10 ml NS and 5 ml Heparin without complaints of discomfort or swelling. VSS Pt discharged via wheelchair in satisfactory condition accompanied by family member

## 2017-10-09 NOTE — Assessment & Plan Note (Signed)
Controlled, no change in medication  

## 2017-10-09 NOTE — Assessment & Plan Note (Signed)
Ongoing need for incontinence supplies as she has ongoing incontinence lifetime, due to poor mobility

## 2017-10-09 NOTE — Patient Instructions (Signed)
[facility] at [facility] Discharge Instructions  RECOMMENDATIONS MADE BY THE CONSULTANT AND ANY TEST RESULTS WILL BE SENT TO YOUR REFERRING PHYSICIAN.  Portacath flushed today per protocol. Follow-up as scheduled. Call clinic for any questions or concerns  Thank you for choosing [facility] at [facility] to provide your oncology and hematology care.  To afford each patient quality time with our provider, please arrive at least 15 minutes before your scheduled appointment time.    If you have a lab appointment with the Cancer Center please come in thru the  Main Entrance and check in at the main information desk  You need to re-schedule your appointment should you arrive 10 or more minutes late.  We strive to give you quality time with our providers, and arriving late affects you and other patients whose appointments are after yours.  Also, if you no show three or more times for appointments you may be dismissed from the clinic at the providers discretion.     Again, thank you for choosing [facility].  Our hope is that these requests will decrease the amount of time that you wait before being seen by our physicians.       _____________________________________________________________  Should you have questions after your visit to [facility], please contact our office at [phone] between the hours of 8:30 a.m. and 4:30 p.m.  Voicemails left after 4:30 p.m. will not be returned until the following business day.  For prescription refill requests, have your pharmacy contact our office.       Resources For Cancer Patients and their Caregivers ? American Cancer Society: Can assist with transportation, wigs, general needs, runs Look Good Feel Better.        1-888-227-6333 ? Cancer Care: Provides financial assistance, online support groups, medication/co-pay assistance.  1-800-813-HOPE (4673) ? [facility] Assists [place] cancer patients and their families through emotional , educational and financial support.  336-427-4357 ? [place] DSS Where to apply for food stamps, Medicaid and utility assistance. 336-342-1394 ? [facility]: Transportation to medical appointments. 336-347-2287 ? Social Security Administration: May apply for disability if have a Stage IV cancer. 336-342-7796 1-800-772-1213 ? [place] Aging, Disability and Transit Services: Assists with nutrition, care and transit needs. 336-349-2343  Cancer Center Support Programs: @10RELATIVEDAYS@ > Cancer Support Group  2nd Tuesday of the month 1pm-2pm, Journey Room  > Creative Journey  3rd Tuesday of the month 1130am-1pm, Journey Room  > Look Good Feel Better  1st Wednesday of the month 10am-12 noon, Journey Room (Call American Cancer Society to register 1-800-395-5775)   

## 2017-10-09 NOTE — Assessment & Plan Note (Signed)
Hyperlipidemia:Low fat diet discussed and encouraged.   Lipid Panel  Lab Results  Component Value Date   CHOL 195 12/16/2016   HDL 61 12/16/2016   LDLCALC 115 (H) 12/16/2016   TRIG 93 12/16/2016   CHOLHDL 3.2 12/16/2016     Not at goal Updated lab needed at/ before next visit.

## 2017-10-09 NOTE — Assessment & Plan Note (Signed)
No falls reported since last  One month Home safety reviewed with patient and caregiver and written information also provided

## 2017-10-09 NOTE — Patient Instructions (Addendum)
Wellness with nurse May 10 or after  Fasting lipid, cmp and eGFR, and vit D first week in March    Flu vaccine  Today   Be careful not to fall    Fall Prevention in the Home Falls can cause injuries. They can happen to people of all ages. There are many things you can do to make your home safe and to help prevent falls. What can I do on the outside of my home?  Regularly fix the edges of walkways and driveways and fix any cracks.  Remove anything that might make you trip as you walk through a door, such as a raised step or threshold.  Trim any bushes or trees on the path to your home.  Use bright outdoor lighting.  Clear any walking paths of anything that might make someone trip, such as rocks or tools.  Regularly check to see if handrails are loose or broken. Make sure that both sides of any steps have handrails.  Any raised decks and porches should have guardrails on the edges.  Have any leaves, snow, or ice cleared regularly.  Use sand or salt on walking paths during winter.  Clean up any spills in your garage right away. This includes oil or grease spills. What can I do in the bathroom?  Use night lights.  Install grab bars by the toilet and in the tub and shower. Do not use towel bars as grab bars.  Use non-skid mats or decals in the tub or shower.  If you need to sit down in the shower, use a plastic, non-slip stool.  Keep the floor dry. Clean up any water that spills on the floor as soon as it happens.  Remove soap buildup in the tub or shower regularly.  Attach bath mats securely with double-sided non-slip rug tape.  Do not have throw rugs and other things on the floor that can make you trip. What can I do in the bedroom?  Use night lights.  Make sure that you have a light by your bed that is easy to reach.  Do not use any sheets or blankets that are too big for your bed. They should not hang down onto the floor.  Have a firm chair that has side  arms. You can use this for support while you get dressed.  Do not have throw rugs and other things on the floor that can make you trip. What can I do in the kitchen?  Clean up any spills right away.  Avoid walking on wet floors.  Keep items that you use a lot in easy-to-reach places.  If you need to reach something above you, use a strong step stool that has a grab bar.  Keep electrical cords out of the way.  Do not use floor polish or wax that makes floors slippery. If you must use wax, use non-skid floor wax.  Do not have throw rugs and other things on the floor that can make you trip. What can I do with my stairs?  Do not leave any items on the stairs.  Make sure that there are handrails on both sides of the stairs and use them. Fix handrails that are broken or loose. Make sure that handrails are as long as the stairways.  Check any carpeting to make sure that it is firmly attached to the stairs. Fix any carpet that is loose or worn.  Avoid having throw rugs at the top or bottom of the stairs. If  you do have throw rugs, attach them to the floor with carpet tape.  Make sure that you have a light switch at the top of the stairs and the bottom of the stairs. If you do not have them, ask someone to add them for you. What else can I do to help prevent falls?  Wear shoes that: ? Do not have high heels. ? Have rubber bottoms. ? Are comfortable and fit you well. ? Are closed at the toe. Do not wear sandals.  If you use a stepladder: ? Make sure that it is fully opened. Do not climb a closed stepladder. ? Make sure that both sides of the stepladder are locked into place. ? Ask someone to hold it for you, if possible.  Clearly mark and make sure that you can see: ? Any grab bars or handrails. ? First and last steps. ? Where the edge of each step is.  Use tools that help you move around (mobility aids) if they are needed. These  include: ? Canes. ? Walkers. ? Scooters. ? Crutches.  Turn on the lights when you go into a dark area. Replace any light bulbs as soon as they burn out.  Set up your furniture so you have a clear path. Avoid moving your furniture around.  If any of your floors are uneven, fix them.  If there are any pets around you, be aware of where they are.  Review your medicines with your doctor. Some medicines can make you feel dizzy. This can increase your chance of falling. Ask your doctor what other things that you can do to help prevent falls. This information is not intended to replace advice given to you by your health care provider. Make sure you discuss any questions you have with your health care provider. Document Released: 09/03/2009 Document Revised: 04/14/2016 Document Reviewed: 12/12/2014 Elsevier Interactive Patient Education  [employer].

## 2017-10-09 NOTE — Progress Notes (Signed)
   Emma Dawson     MRN: [id number]      DOB: 1932-10-13   HPI Emma Dawson is here for follow up and re-evaluation of chronic medical conditions, medication management and review of any available recent lab and radiology data.  Preventive health is updated, specifically   Immunization.    The PT denies any adverse reactions to current medications since the last visit.  There are no new concerns.  There are no specific complaints  Accompanied by one of her caregivers,her great nephew, he denies any falls, states she has 24 hr supervisions although she remains in her own home  ROS Denies recent fever or chills.Reports good appetite Denies sinus pressure, nasal congestion, ear pain or sore throat. Denies chest congestion, productive cough or wheezing. Denies chest pains, palpitations and leg swelling Denies abdominal pain, nausea, vomiting,diarrhea or constipation.   Denies dysuria, frequency, hesitancy or incontinence. C/o oint pain, swelling and limitation in mobility.Needs assistive devise fo safe mobility and is wheelchair dependent to a great extent, cannot safely ambulate without someone nearby, has had multiple falls in the past  Denies headaches, seizures, numbness, or tingling. Denies depression, anxiety or insomnia. Denies skin break down or rash.   PE  BP 140/64   Pulse 77   Resp 16   Ht 5\' 2"  (1.575 m)   Wt 144 lb (65.3 kg)   SpO2 98%   BMI 26.34 kg/m   Patient alert and oriented and in no cardiopulmonary distress.  HEENT: No facial asymmetry, EOMI,   oropharynx pink and moist.  Neck decreased ROM no JVD, no mass.  Chest: Clear to auscultation bilaterally.  CVS: S1, S2 no murmurs, no S3.Regular rate.  ABD: Soft non tender.   Ext: No edema  MS: decreased  ROM spine, shoulders, hips and knees.  Skin: Intact, no ulcerations or rash noted.  Psych: Good eye contact, normal affect. Memory impaired not anxious or depressed appearing.  CNS: CN 2-12 intact, power,   normal throughout.no focal deficits noted.   Assessment & Plan  Essential hypertension Controlled, no change in medication   Hyperlipidemia LDL goal <100 Hyperlipidemia:Low fat diet discussed and encouraged.   Lipid Panel  Lab Results  Component Value Date   CHOL 195 12/16/2016   HDL 61 12/16/2016   LDLCALC 115 (H) 12/16/2016   TRIG 93 12/16/2016   CHOLHDL 3.2 12/16/2016     Not at goal Updated lab needed at/ before next visit.   Recurrent falls while walking No falls reported since last  One month Home safety reviewed with patient and caregiver and written information also provided  Incontinence in female Ongoing need for incontinence supplies as she has ongoing incontinence lifetime, due to poor mobility

## 2017-10-18 ENCOUNTER — Other Ambulatory Visit: Payer: Self-pay | Admitting: Family Medicine

## 2017-11-09 ENCOUNTER — Telehealth: Payer: Self-pay | Admitting: Family Medicine

## 2017-11-09 NOTE — Telephone Encounter (Signed)
[name] is calling and wants FORM FL2 (nursing home) Completed for Emma Dawson. If you have any questions please call Tonya at [phone]

## 2017-11-15 NOTE — Telephone Encounter (Signed)
Completed for dr simpson to review and sign on her return

## 2017-11-20 ENCOUNTER — Telehealth: Payer: Self-pay

## 2017-11-20 ENCOUNTER — Other Ambulatory Visit: Payer: Self-pay | Admitting: Family Medicine

## 2017-11-20 NOTE — Telephone Encounter (Signed)
Called Tonya at [phone] to advise that the FL@ form is ready

## 2017-11-20 NOTE — Telephone Encounter (Signed)
Needs fl2 signed and faxed

## 2017-11-24 ENCOUNTER — Telehealth: Payer: Self-pay | Admitting: Family Medicine

## 2017-11-24 NOTE — Telephone Encounter (Signed)
[facility] called to let you know on patients FL2 form it stated nursing home and it needs to say assisted living  Cb#: [phone]

## 2017-11-25 DIAGNOSIS — N3000 Acute cystitis without hematuria: Secondary | ICD-10-CM | POA: Insufficient documentation

## 2017-11-25 DIAGNOSIS — Z79899 Other long term (current) drug therapy: Secondary | ICD-10-CM | POA: Insufficient documentation

## 2017-11-25 DIAGNOSIS — R229 Localized swelling, mass and lump, unspecified: Secondary | ICD-10-CM | POA: Diagnosis not present

## 2017-11-25 DIAGNOSIS — I10 Essential (primary) hypertension: Secondary | ICD-10-CM | POA: Insufficient documentation

## 2017-11-25 DIAGNOSIS — Z859 Personal history of malignant neoplasm, unspecified: Secondary | ICD-10-CM | POA: Diagnosis not present

## 2017-11-25 DIAGNOSIS — M7989 Other specified soft tissue disorders: Secondary | ICD-10-CM | POA: Diagnosis not present

## 2017-11-25 DIAGNOSIS — R600 Localized edema: Secondary | ICD-10-CM | POA: Diagnosis present

## 2017-11-25 DIAGNOSIS — Z7982 Long term (current) use of aspirin: Secondary | ICD-10-CM | POA: Insufficient documentation

## 2017-11-26 ENCOUNTER — Emergency Department (HOSPITAL_COMMUNITY)
Admission: EM | Admit: 2017-11-26 | Discharge: 2017-11-26 | Disposition: A | Discharge status: Home / Self Care | Payer: Medicare Other | Attending: Emergency Medicine | Admitting: Emergency Medicine

## 2017-11-26 ENCOUNTER — Other Ambulatory Visit: Payer: Self-pay

## 2017-11-26 ENCOUNTER — Encounter (HOSPITAL_COMMUNITY): Payer: Self-pay | Admitting: *Deleted

## 2017-11-26 DIAGNOSIS — N3000 Acute cystitis without hematuria: Secondary | ICD-10-CM

## 2017-11-26 LAB — CBC WITH DIFFERENTIAL/PLATELET
Basophils Absolute: 0.1 10*3/uL (ref 0.0–0.1)
Basophils Relative: 1 %
Eosinophils Absolute: 0.3 10*3/uL (ref 0.0–0.7)
Eosinophils Relative: 3 %
HCT: 32.3 % — ABNORMAL LOW (ref 36.0–46.0)
Hemoglobin: 10.1 g/dL — ABNORMAL LOW (ref 12.0–15.0)
Lymphocytes Relative: 16 %
Lymphs Abs: 1.4 10*3/uL (ref 0.7–4.0)
MCH: 25.7 pg — ABNORMAL LOW (ref 26.0–34.0)
MCHC: 31.3 g/dL (ref 30.0–36.0)
MCV: 82.2 fL (ref 78.0–100.0)
Monocytes Absolute: 0.9 10*3/uL (ref 0.1–1.0)
Monocytes Relative: 10 %
Neutro Abs: 6.2 10*3/uL (ref 1.7–7.7)
Neutrophils Relative %: 70 %
Platelets: 228 10*3/uL (ref 150–400)
RBC: 3.93 MIL/uL (ref 3.87–5.11)
RDW: 14.3 % (ref 11.5–15.5)
WBC: 8.8 10*3/uL (ref 4.0–10.5)

## 2017-11-26 LAB — COMPREHENSIVE METABOLIC PANEL
ALT: 12 U/L — ABNORMAL LOW (ref 14–54)
ANION GAP: 9 (ref 5–15)
AST: 19 U/L (ref 15–41)
Albumin: 3.6 g/dL (ref 3.5–5.0)
Alkaline Phosphatase: 53 U/L (ref 38–126)
BUN: 14 mg/dL (ref 6–20)
CHLORIDE: 104 mmol/L (ref 101–111)
CO2: 26 mmol/L (ref 22–32)
Calcium: 9.3 mg/dL (ref 8.9–10.3)
Creatinine, Ser: 0.98 mg/dL (ref 0.44–1.00)
GFR, EST NON AFRICAN AMERICAN: 52 mL/min — AB (ref >60)
Glucose, Bld: 115 mg/dL — ABNORMAL HIGH (ref 65–99)
POTASSIUM: 3.9 mmol/L (ref 3.5–5.1)
SODIUM: 139 mmol/L (ref 135–145)
Total Bilirubin: 0.5 mg/dL (ref 0.3–1.2)
Total Protein: 7.5 g/dL (ref 6.5–8.1)

## 2017-11-26 LAB — URINALYSIS, ROUTINE W REFLEX MICROSCOPIC
Bilirubin Urine: NEGATIVE
Glucose, UA: NEGATIVE mg/dL
Hgb urine dipstick: NEGATIVE
Ketones, ur: NEGATIVE mg/dL
Nitrite: NEGATIVE
Protein, ur: 30 mg/dL — AB
Specific Gravity, Urine: 1.011 (ref 1.005–1.030)
Squamous Epithelial / LPF: NONE SEEN
pH: 8 (ref 5.0–8.0)

## 2017-11-26 MED ORDER — CEPHALEXIN 500 MG PO CAPS: 500.0000 mg | ORAL_CAPSULE | Freq: Three times a day (TID) | ORAL | 0 refills | Status: DC

## 2017-11-26 MED ORDER — CEPHALEXIN 500 MG PO CAPS
500.0000 mg | ORAL_CAPSULE | Freq: Once | ORAL | Status: AC
  Administered 2017-11-26: 500 mg via ORAL
  Filled 2017-11-26: qty 1

## 2017-11-26 NOTE — ED Notes (Signed)
Pt and pts family member complaining about long wait.

## 2017-11-26 NOTE — ED Provider Notes (Addendum)
[facility] EMERGENCY DEPARTMENT Provider Note   CSN: [id number] Arrival date & time: 11/25/17  2353  Time seen 04:00 AM   History   Chief Complaint Chief Complaint  Patient presents with  . Leg Swelling    HPI Emma Dawson is a 84 y.o. female.  HPI patient states she has been having swelling of her feet since February 2018.  She states her doctor told her it was from arthritis.  She states she is still able to ambulate with a walker.  She denies any chest pain or shortness of breath or fever.  She only complains of soreness across the top of her feet.  She states she puts BenGay and vinegar on it and feels better.  Her family states she was confused today and when asked to give an example they states she is more forgetful than usual and that she is "mean".  She also has some urinary incontinence today.    PCP Fayrene Helper, MD   Past Medical History:  Diagnosis Date  . Allergic rhinitis   . Alzheimer's dementia   . Cancer ([place]) 03/2003   radiation and chemo ND mrm  . History of breast cancer   . History of chemotherapy    And radiation secondary to breast cancer  . Hyperlipidemia   . Hypertension   . IGT (impaired glucose tolerance) 2014   DIET MANAGED  . Normocytic anemia   . Osteoarthritis   . Port catheter in place 03/05/2013    Patient Active Problem List   Diagnosis Date Noted  . Incontinence in female 01/26/2017  . Knee pain, bilateral 12/16/2016  . Need for assistance due to unsteady gait 12/16/2016  . Recurrent falls while walking 12/16/2016  . Memory loss 06/02/2015  . Personal history of noncompliance with medical treatment, presenting hazards to health 09/27/2013  . Genital herpes 05/02/2008  . CAROTID ARTERY STENOSIS, LEFT 05/02/2008  . SYNCOPE, VASOVAGAL 05/02/2008  . CARDIAC MURMUR 05/02/2008  . Anemia 10/03/2007  . Hyperlipidemia LDL goal <100 09/27/2006  . Essential hypertension 09/27/2006  . Allergic rhinitis 09/27/2006  . Osteoarthritis  09/27/2006  . Invasive ductal carcinoma of breast ([place]) 09/27/2006    Past Surgical History:  Procedure Laterality Date  . ABDOMINAL HYSTERECTOMY  1970   Fibroid tumors, benign  . BREAST SURGERY Right 05/06/2003   MRM  . MASTECTOMY  2004   Right  . PORTACATH PLACEMENT      OB History    No data available       Home Medications    Prior to Admission medications   Medication Sig Start Date End Date Taking? Authorizing Provider  acetaminophen (TYLENOL) 500 MG tablet One tablet twice daily as needed for knee pain Patient taking differently: Take 500 mg by mouth every 8 (eight) hours as needed for mild pain or moderate pain.  05/08/12  Yes Fayrene Helper, MD  amLODipine (NORVASC) 10 MG tablet Take 1 tablet (10 mg total) daily by mouth. 10/09/17  Yes Fayrene Helper, MD  aspirin 81 MG tablet Take 81 mg by mouth daily.    Yes [provider]  atorvastatin (LIPITOR) 40 MG tablet Take 1 tablet (40 mg total) daily by mouth. 10/09/17  Yes Fayrene Helper, MD  docusate sodium (COLACE) 100 MG capsule Take 100 mg by mouth daily as needed for mild constipation.   Yes [provider]  donepezil (ARICEPT) 10 MG tablet  04/11/17  Yes [provider]  feeding supplement, ENSURE  COMPLETE, (ENSURE COMPLETE) LIQD Take 237 mLs by mouth 2 (two) times daily between meals. Patient taking differently: Take 237 mLs by mouth daily.  01/25/17  Yes Fayrene Helper, MD  Incontinence Supply Disposable (DISPOSABLE BRIEF LARGE) MISC Use as needed 03/17/17  Yes Fayrene Helper, MD  Incontinence Supply Disposable (UNDERPADS) MISC Use as needed 03/17/17  Yes Fayrene Helper, MD  losartan (COZAAR) 100 MG tablet Take 1 tablet (100 mg total) daily by mouth. 10/09/17  Yes Fayrene Helper, MD  Multiple Vitamin (MULTIVITAMIN WITH MINERALS) TABS tablet Take 1 tablet by mouth daily.   Yes [provider]  simvastatin (ZOCOR) 40 MG tablet Take 40 mg by mouth daily.    Yes [provider]  traZODone (DESYREL) 50 MG tablet Half tablet at bedtime, as needed, 02/09/17  Yes Fayrene Helper, MD  UNABLE TO FIND Bed alarm dx- R29.6 01/25/17  Yes Fayrene Helper, MD  UNABLE TO FIND Prevail Pads [phone] 11" 6 bags/96 pads per month Dx: urinary incontinence 06/29/17  Yes Fayrene Helper, MD  Vitamin D, Ergocalciferol, (DRISDOL) 50000 units CAPS capsule TAKE (1) CAPSULE BY MOUTH ONCE EVERY 7 DAYS. 11/20/17  Yes Fayrene Helper, MD  zolpidem (AMBIEN) 5 MG tablet Take 1 tablet by mouth daily. 02/28/17  Yes [provider]  cephALEXin (KEFLEX) 500 MG capsule Take 1 capsule (500 mg total) by mouth 3 (three) times daily. 11/26/17   Rolland Porter, MD    Family History Family History  Problem Relation Age of Onset  . Stroke Mother   . Heart attack Father   . Coronary artery disease Father   . Coronary artery disease Brother   . Heart attack Sister   . Heart disease Sister   . Arthritis Sister   . Heart disease Sister   . Dementia Sister   . Lung cancer Brother   . Prostate cancer Brother     Social History Social History   Tobacco Use  . Smoking status: Never Smoker  . Smokeless tobacco: Never Used  Substance Use Topics  . Alcohol use: No  . Drug use: No  lives at home Uses a walker Has 24/7 in home care   Allergies   Aricept [donepezil hcl]   Review of Systems Review of Systems  All other systems reviewed and are negative.    Physical Exam Updated Vital Signs BP 134/62   Pulse 67   Temp 98.1 F (36.7 C) (Oral)   Resp 20   Ht 5\' 3"  (1.6 m)   Wt 59 kg (130 lb)   SpO2 96%   BMI 23.03 kg/m   Vital signs normal    Physical Exam  Constitutional: She is oriented to person, place, and time. She appears well-developed and well-nourished.  Non-toxic appearance. She does not appear ill. No distress.  Pleasant  HENT:  Head: Normocephalic and atraumatic.  Right Ear: External ear normal.  Left Ear: External ear normal.    Nose: Nose normal. No mucosal edema or rhinorrhea.  Mouth/Throat: Oropharynx is clear and moist and mucous membranes are normal. No dental abscesses or uvula swelling.  Eyes: Conjunctivae and EOM are normal. Pupils are equal, round, and reactive to light.  Neck: Normal range of motion and full passive range of motion without pain. Neck supple.  Cardiovascular: Normal rate and regular rhythm. Exam reveals no gallop and no friction rub.  Murmur heard.  Systolic murmur is present. Patient has a harsh short systolic murmur heard best in  the right upper sternal border consistent with aortic stenosis  Pulmonary/Chest: Effort normal and breath sounds normal. No respiratory distress. She has no wheezes. She has no rhonchi. She has no rales. She exhibits no tenderness and no crepitus.  Abdominal: Soft. Normal appearance and bowel sounds are normal. She exhibits no distension. There is no tenderness. There is no rebound and no guarding.  Musculoskeletal: Normal range of motion. She exhibits edema. She exhibits no tenderness.  Patient is noted to have some diffuse swelling of both lower extremities, the right is worse than the left which patient states is usual.  She has noted to have more swelling of the dorsum of her right foot than the left.  However she has no pain when I palpate her legs.  Neurological: She is alert and oriented to person, place, and time. She has normal strength. No cranial nerve deficit.  Patient is fully alert and knows where she is.  Skin: Skin is warm, dry and intact. No rash noted. No erythema. No pallor.  Psychiatric: She has a normal mood and affect. Her speech is normal and behavior is normal. Her mood appears not anxious.  Nursing note and vitals reviewed.    ED Treatments / Results  Labs (all labs ordered are listed, but only abnormal results are displayed)  Results for orders placed or performed during the hospital encounter of 11/26/17  Urinalysis, Routine w reflex  microscopic  Result Value Ref Range   Color, Urine YELLOW YELLOW   APPearance CLOUDY (A) CLEAR   Specific Gravity, Urine 1.011 1.005 - 1.030   pH 8.0 5.0 - 8.0   Glucose, UA NEGATIVE NEGATIVE mg/dL   Hgb urine dipstick NEGATIVE NEGATIVE   Bilirubin Urine NEGATIVE NEGATIVE   Ketones, ur NEGATIVE NEGATIVE mg/dL   Protein, ur 30 (A) NEGATIVE mg/dL   Nitrite NEGATIVE NEGATIVE   Leukocytes, UA LARGE (A) NEGATIVE   RBC / HPF 6-30 0 - 5 RBC/hpf   WBC, UA 6-30 0 - 5 WBC/hpf   Bacteria, UA RARE (A) NONE SEEN   Squamous Epithelial / LPF NONE SEEN NONE SEEN   WBC Clumps PRESENT   Comprehensive metabolic panel  Result Value Ref Range   Sodium 139 135 - 145 mmol/L   Potassium 3.9 3.5 - 5.1 mmol/L   Chloride 104 101 - 111 mmol/L   CO2 26 22 - 32 mmol/L   Glucose, Bld 115 (H) 65 - 99 mg/dL   BUN 14 6 - 20 mg/dL   Creatinine, Ser 0.98 0.44 - 1.00 mg/dL   Calcium 9.3 8.9 - 10.3 mg/dL   Total Protein 7.5 6.5 - 8.1 g/dL   Albumin 3.6 3.5 - 5.0 g/dL   AST 19 15 - 41 U/L   ALT 12 (L) 14 - 54 U/L   Alkaline Phosphatase 53 38 - 126 U/L   Total Bilirubin 0.5 0.3 - 1.2 mg/dL   GFR calc non Af Amer 52 (L) >60 mL/min   GFR calc Af Amer >60 >60 mL/min   Anion gap 9 5 - 15  CBC with Differential  Result Value Ref Range   WBC 8.8 4.0 - 10.5 K/uL   RBC 3.93 3.87 - 5.11 MIL/uL   Hemoglobin 10.1 (L) 12.0 - 15.0 g/dL   HCT 32.3 (L) 36.0 - 46.0 %   MCV 82.2 78.0 - 100.0 fL   MCH 25.7 (L) 26.0 - 34.0 pg   MCHC 31.3 30.0 - 36.0 g/dL   RDW 14.3 11.5 - 15.5 %  Platelets 228 150 - 400 K/uL   Neutrophils Relative % 70 %   Neutro Abs 6.2 1.7 - 7.7 K/uL   Lymphocytes Relative 16 %   Lymphs Abs 1.4 0.7 - 4.0 K/uL   Monocytes Relative 10 %   Monocytes Absolute 0.9 0.1 - 1.0 K/uL   Eosinophils Relative 3 %   Eosinophils Absolute 0.3 0.0 - 0.7 K/uL   Basophils Relative 1 %   Basophils Absolute 0.1 0.0 - 0.1 K/uL   Laboratory interpretation all normal except anemia, UTI    EKG  EKG  Interpretation None       Radiology No results found.  Procedures Procedures (including critical care time)  Medications Ordered in ED Medications  cephALEXin (KEFLEX) capsule 500 mg (500 mg Oral Given 11/26/17 0546)     Initial Impression / Assessment and Plan / ED Course  I have reviewed the triage vital signs and the nursing notes.  Pertinent labs & imaging results that were available during my care of the patient were reviewed by me and considered in my medical decision making (see chart for details).     Review of patient's prior urine culture shows in March 2018 she had multiple species, and in July 2016 she had no growth.  In June 2009 she had 8000 colonies, insignificant growth.  Patient was given oral Keflex, she has not had any vomiting or any reason she cannot take oral antibiotics.  The house supervisor had already had to come down before patient was seen because family was irate.  They state her primary care doctor was going to directly admit her.  When the house supervisor talked to the doctor on call she states she told him come to the ED for evaluation.  5:40 AM I just talked to the family about her test results.  She does have a UTI however at this point I do not feel it patient needs to be admitted, she has 24-hour care at home.  At this point the family member started getting belligerent with me, she called me a "dumb bitch".  Security had to be called.   Final Clinical Impressions(s) / ED Diagnoses   Final diagnoses:  Acute cystitis without hematuria    ED Discharge Orders        Ordered    cephALEXin (KEFLEX) 500 MG capsule  3 times daily     11/26/17 0547      Plan discharge  Rolland Porter, MD, Barbette Or, MD 11/26/17 [place], [place], MD 11/26/17 [phone]

## 2017-11-26 NOTE — Discharge Instructions (Signed)
Take the antibiotic until gone. You can have Dr Moshe Cipro recheck you in the office this week.

## 2017-11-26 NOTE — ED Triage Notes (Signed)
Pt arrived by EMS from home. Pt states family wanted her checked out. Pt says right leg swelling for some time now. EMS reported family thinks she may have a UTI also.

## 2017-11-27 LAB — URINE CULTURE
CULTURE: NO GROWTH
SPECIAL REQUESTS: NORMAL

## 2017-11-29 NOTE — Telephone Encounter (Signed)
This is ready to be collected now

## 2017-12-04 ENCOUNTER — Encounter (HOSPITAL_COMMUNITY): Payer: Medicare Other

## 2017-12-06 ENCOUNTER — Other Ambulatory Visit: Payer: Self-pay

## 2017-12-06 ENCOUNTER — Emergency Department (HOSPITAL_COMMUNITY): Payer: Medicare Other

## 2017-12-06 ENCOUNTER — Encounter (HOSPITAL_COMMUNITY): Payer: Self-pay | Admitting: Emergency Medicine

## 2017-12-06 ENCOUNTER — Telehealth: Payer: Self-pay | Admitting: Family Medicine

## 2017-12-06 ENCOUNTER — Observation Stay (HOSPITAL_COMMUNITY)
Admission: EM | Admit: 2017-12-06 | Discharge: 2017-12-09 | Payer: Medicare Other | Attending: Internal Medicine | Admitting: Internal Medicine

## 2017-12-06 DIAGNOSIS — D649 Anemia, unspecified: Secondary | ICD-10-CM | POA: Diagnosis present

## 2017-12-06 DIAGNOSIS — R404 Transient alteration of awareness: Secondary | ICD-10-CM | POA: Diagnosis not present

## 2017-12-06 DIAGNOSIS — Z923 Personal history of irradiation: Secondary | ICD-10-CM | POA: Diagnosis not present

## 2017-12-06 DIAGNOSIS — M7989 Other specified soft tissue disorders: Secondary | ICD-10-CM | POA: Diagnosis not present

## 2017-12-06 DIAGNOSIS — Z853 Personal history of malignant neoplasm of breast: Secondary | ICD-10-CM | POA: Diagnosis not present

## 2017-12-06 DIAGNOSIS — I440 Atrioventricular block, first degree: Secondary | ICD-10-CM | POA: Insufficient documentation

## 2017-12-06 DIAGNOSIS — N390 Urinary tract infection, site not specified: Secondary | ICD-10-CM | POA: Insufficient documentation

## 2017-12-06 DIAGNOSIS — M79604 Pain in right leg: Secondary | ICD-10-CM | POA: Insufficient documentation

## 2017-12-06 DIAGNOSIS — I491 Atrial premature depolarization: Secondary | ICD-10-CM | POA: Insufficient documentation

## 2017-12-06 DIAGNOSIS — R2689 Other abnormalities of gait and mobility: Secondary | ICD-10-CM | POA: Diagnosis not present

## 2017-12-06 DIAGNOSIS — I10 Essential (primary) hypertension: Secondary | ICD-10-CM | POA: Diagnosis present

## 2017-12-06 DIAGNOSIS — Z79899 Other long term (current) drug therapy: Secondary | ICD-10-CM | POA: Diagnosis not present

## 2017-12-06 DIAGNOSIS — E785 Hyperlipidemia, unspecified: Secondary | ICD-10-CM | POA: Diagnosis not present

## 2017-12-06 DIAGNOSIS — R102 Pelvic and perineal pain: Secondary | ICD-10-CM | POA: Diagnosis not present

## 2017-12-06 DIAGNOSIS — R0789 Other chest pain: Secondary | ICD-10-CM | POA: Diagnosis not present

## 2017-12-06 DIAGNOSIS — Z9221 Personal history of antineoplastic chemotherapy: Secondary | ICD-10-CM | POA: Insufficient documentation

## 2017-12-06 DIAGNOSIS — N183 Chronic kidney disease, stage 3 unspecified: Secondary | ICD-10-CM

## 2017-12-06 DIAGNOSIS — M1990 Unspecified osteoarthritis, unspecified site: Secondary | ICD-10-CM | POA: Diagnosis not present

## 2017-12-06 DIAGNOSIS — G309 Alzheimer's disease, unspecified: Secondary | ICD-10-CM | POA: Diagnosis not present

## 2017-12-06 DIAGNOSIS — R32 Unspecified urinary incontinence: Secondary | ICD-10-CM | POA: Diagnosis not present

## 2017-12-06 DIAGNOSIS — F0280 Dementia in other diseases classified elsewhere without behavioral disturbance: Secondary | ICD-10-CM | POA: Diagnosis not present

## 2017-12-06 DIAGNOSIS — Z7982 Long term (current) use of aspirin: Secondary | ICD-10-CM | POA: Insufficient documentation

## 2017-12-06 DIAGNOSIS — R609 Edema, unspecified: Secondary | ICD-10-CM

## 2017-12-06 DIAGNOSIS — R531 Weakness: Principal | ICD-10-CM | POA: Insufficient documentation

## 2017-12-06 DIAGNOSIS — Z885 Allergy status to narcotic agent status: Secondary | ICD-10-CM | POA: Diagnosis not present

## 2017-12-06 DIAGNOSIS — Z8249 Family history of ischemic heart disease and other diseases of the circulatory system: Secondary | ICD-10-CM | POA: Insufficient documentation

## 2017-12-06 DIAGNOSIS — I129 Hypertensive chronic kidney disease with stage 1 through stage 4 chronic kidney disease, or unspecified chronic kidney disease: Secondary | ICD-10-CM | POA: Diagnosis not present

## 2017-12-06 DIAGNOSIS — C50919 Malignant neoplasm of unspecified site of unspecified female breast: Secondary | ICD-10-CM | POA: Diagnosis present

## 2017-12-06 DIAGNOSIS — R600 Localized edema: Secondary | ICD-10-CM | POA: Diagnosis not present

## 2017-12-06 LAB — CBC WITH DIFFERENTIAL/PLATELET
BASOS ABS: 0.1 10*3/uL (ref 0.0–0.1)
BASOS PCT: 1 %
EOS ABS: 0.5 10*3/uL (ref 0.0–0.7)
EOS PCT: 6 %
HCT: 32.9 % — ABNORMAL LOW (ref 36.0–46.0)
Hemoglobin: 10.3 g/dL — ABNORMAL LOW (ref 12.0–15.0)
LYMPHS PCT: 25 %
Lymphs Abs: 2.1 10*3/uL (ref 0.7–4.0)
MCH: 25.9 pg — ABNORMAL LOW (ref 26.0–34.0)
MCHC: 31.3 g/dL (ref 30.0–36.0)
MCV: 82.9 fL (ref 78.0–100.0)
MONO ABS: 0.8 10*3/uL (ref 0.1–1.0)
Monocytes Relative: 9 %
Neutro Abs: 4.9 10*3/uL (ref 1.7–7.7)
Neutrophils Relative %: 59 %
Platelets: 328 10*3/uL (ref 150–400)
RBC: 3.97 MIL/uL (ref 3.87–5.11)
RDW: 14.7 % (ref 11.5–15.5)
WBC: 8.3 10*3/uL (ref 4.0–10.5)

## 2017-12-06 LAB — COMPREHENSIVE METABOLIC PANEL
ALT: 29 U/L (ref 14–54)
AST: 37 U/L (ref 15–41)
Albumin: 3.6 g/dL (ref 3.5–5.0)
Alkaline Phosphatase: 69 U/L (ref 38–126)
Anion gap: 10 (ref 5–15)
BUN: 20 mg/dL (ref 6–20)
CHLORIDE: 101 mmol/L (ref 101–111)
CO2: 27 mmol/L (ref 22–32)
Calcium: 9.3 mg/dL (ref 8.9–10.3)
Creatinine, Ser: 1.33 mg/dL — ABNORMAL HIGH (ref 0.44–1.00)
GFR calc Af Amer: 42 mL/min — ABNORMAL LOW (ref >60)
GFR, EST NON AFRICAN AMERICAN: 36 mL/min — AB (ref >60)
Glucose, Bld: 111 mg/dL — ABNORMAL HIGH (ref 65–99)
Potassium: 3.9 mmol/L (ref 3.5–5.1)
SODIUM: 138 mmol/L (ref 135–145)
Total Bilirubin: 0.3 mg/dL (ref 0.3–1.2)
Total Protein: 7.4 g/dL (ref 6.5–8.1)

## 2017-12-06 LAB — TROPONIN I

## 2017-12-06 MED ORDER — ENOXAPARIN SODIUM 60 MG/0.6ML ~~LOC~~ SOLN
1.0000 mg/kg | Freq: Once | SUBCUTANEOUS | Status: AC
Start: 2017-12-06 — End: 2017-12-06
  Administered 2017-12-06: 60 mg via SUBCUTANEOUS
  Filled 2017-12-06: qty 0.6

## 2017-12-06 NOTE — Telephone Encounter (Signed)
Tonya the caregiver came by and needs help, Mrs Emma Dawson is worse. Not walking since the UTI, nor sure what to do.[name]      [phone]

## 2017-12-06 NOTE — H&P (Signed)
History and Physical    Emma Dawson FBP:[id number] DOB: 04-04-1932 DOA: 12/06/2017  PCP: Fayrene Helper, MD   Patient coming from: Dr. Freddie Apley office.  I have personally briefly reviewed patient's old medical records in [facility]  Chief Complaint: Weakness and RLE swelling.  HPI: Emma Dawson is a 84 y.o. female with medical history significant of allergic rhinitis, Alzheimer's dementia, breast cancer, hyperlipidemia, hypertension, impaired glucose tolerance, normocytic anemia, osteoarthritis who is referred by Dr. Merlene Laughter to the emergency department due to weakness since she was discharged from the hospital 11/26/2017 and right lower extremity edema/tenderness.  She is unable to provide much history, but when asked about symptoms denied headache, chest pain, dyspnea, abdominal pain, diarrhea, constipation, dysuria or frequency.  ED Course: Initial vital signs temperature 97.38F, pulse 79, respirations 18, blood pressure 144/55 mmHg and O2 sat 99% on room air.  She received single dose of Lovenox SQ 1 mg/kg in the ED.  Workup shows white count of 8.3 with a normal differential, hemoglobin 10.3 g/dL and platelets 328.  Her CMP showed a glucose of 111 and creatinine of 1.33 mg/dL, all other values are normal.  Review of Systems: As per HPI otherwise 10 point review of systems negative.    Past Medical History:  Diagnosis Date  . Allergic rhinitis   . Alzheimer's dementia   . Cancer ([place]) 03/2003   radiation and chemo ND mrm  . History of breast cancer   . History of chemotherapy    And radiation secondary to breast cancer  . Hyperlipidemia   . Hypertension   . IGT (impaired glucose tolerance) 2014   DIET MANAGED  . Normocytic anemia   . Osteoarthritis   . Port catheter in place 03/05/2013    Past Surgical History:  Procedure Laterality Date  . ABDOMINAL HYSTERECTOMY  1970   Fibroid tumors, benign  . BREAST SURGERY Right 05/06/2003   MRM  . MASTECTOMY   2004   Right  . PORTACATH PLACEMENT       reports that  has never smoked. she has never used smokeless tobacco. She reports that she does not drink alcohol or use drugs.  Allergies  Allergen Reactions  . Aricept [Donepezil Hcl]     Hair loss     Family History  Problem Relation Age of Onset  . Stroke Mother   . Heart attack Father   . Coronary artery disease Father   . Coronary artery disease Brother   . Heart attack Sister   . Heart disease Sister   . Arthritis Sister   . Heart disease Sister   . Dementia Sister   . Lung cancer Brother   . Prostate cancer Brother     Prior to Admission medications   Medication Sig Start Date End Date Taking? Authorizing Provider  amLODipine (NORVASC) 10 MG tablet Take 1 tablet (10 mg total) daily by mouth. 10/09/17  Yes Fayrene Helper, MD  aspirin 81 MG tablet Take 81 mg by mouth daily.    Yes [provider]  atorvastatin (LIPITOR) 40 MG tablet Take 1 tablet (40 mg total) daily by mouth. 10/09/17  Yes Fayrene Helper, MD  cephALEXin (KEFLEX) 500 MG capsule Take 1 capsule (500 mg total) by mouth 3 (three) times daily. 11/26/17  Yes Rolland Porter, MD  docusate sodium (COLACE) 100 MG capsule Take 100 mg by mouth daily as needed for mild constipation.   Yes [provider]  donepezil (ARICEPT) 10 MG tablet Take 10 mg by mouth daily.  04/11/17  Yes [provider]  feeding supplement, ENSURE COMPLETE, (ENSURE COMPLETE) LIQD Take 237 mLs by mouth 2 (two) times daily between meals. 01/25/17  Yes Fayrene Helper, MD  losartan (COZAAR) 100 MG tablet Take 1 tablet (100 mg total) daily by mouth. 10/09/17  Yes Fayrene Helper, MD  memantine (NAMENDA) 5 MG tablet Take 5 mg by mouth 2 (two) times daily.   Yes [provider]  Multiple Vitamin (MULTIVITAMIN WITH MINERALS) TABS tablet Take 1 tablet by mouth daily.   Yes [provider]  Vitamin D, Ergocalciferol, (DRISDOL) 50000 units CAPS capsule TAKE  (1) CAPSULE BY MOUTH ONCE EVERY 7 DAYS. 11/20/17  Yes Fayrene Helper, MD  zolpidem (AMBIEN) 5 MG tablet Take 1 tablet by mouth at bedtime.  02/28/17  Yes [provider]  acetaminophen (TYLENOL) 500 MG tablet One tablet twice daily as needed for knee pain Patient taking differently: Take 500 mg by mouth every 8 (eight) hours as needed for mild pain or moderate pain.  05/08/12   Fayrene Helper, MD  Incontinence Supply Disposable (DISPOSABLE BRIEF LARGE) MISC Use as needed 03/17/17   Fayrene Helper, MD  Incontinence Supply Disposable (UNDERPADS) MISC Use as needed 03/17/17   Fayrene Helper, MD  UNABLE TO FIND Bed alarm dx- R29.6 01/25/17   Fayrene Helper, MD  UNABLE TO FIND Prevail Pads [phone] 11" 6 bags/96 pads per month Dx: urinary incontinence 06/29/17   Fayrene Helper, MD    Physical Exam: Vitals:   12/06/17 1841 12/06/17 1847 12/06/17 2122  BP:  (!) 144/55 (!) 137/56  Pulse:  79 72  Resp:  18 16  Temp:  97.7 F (36.5 C)   TempSrc:  Oral   SpO2:  99% 100%  Weight: 59 kg (130 lb) 61.2 kg (135 lb)   Height: 5\' 3"  (1.6 m) 5\' 2"  (1.575 m)     Constitutional: NAD, calm, comfortable Eyes: PERRL, lids and conjunctivae normal ENMT: Mucous membranes are moist. Posterior pharynx clear of any exudate or lesions. Neck: normal, supple, no masses, no thyromegaly Respiratory: clear to auscultation bilaterally, no wheezing, no crackles. Normal respiratory effort. No accessory muscle use.  Cardiovascular: Regular rate and rhythm, positive 2/6 systolic murmur, no rubs / gallops.  2+ Right lower extremity edema. 2+ pedal pulses. No carotid bruits.  Abdomen: no tenderness, no masses palpated. No hepatosplenomegaly. Bowel sounds positive.  Musculoskeletal: Positive right calf tenderness.  No clubbing / cyanosis. Good ROM, no contractures. Normal muscle tone.  Skin: no significant rashes, lesions, ulcers on limited skin exam. Neurologic: CN 2-12 grossly intact. Sensation  intact, DTR normal. Strength 5/5 in all 4.  Psychiatric: Alert and oriented x 2. Normal mood.    Labs on Admission: I have personally reviewed following labs and imaging studies  CBC: Recent Labs  Lab 12/06/17 1913  WBC 8.3  NEUTROABS 4.9  HGB 10.3*  HCT 32.9*  MCV 82.9  PLT 604   Basic Metabolic Panel: Recent Labs  Lab 12/06/17 1913  NA 138  K 3.9  CL 101  CO2 27  GLUCOSE 111*  BUN 20  CREATININE 1.33*  CALCIUM 9.3   GFR: Estimated Creatinine Clearance: 27.6 mL/min (A) (by C-G formula based on SCr of 1.33 mg/dL (H)). Liver Function Tests: Recent Labs  Lab 12/06/17 1913  AST 37  ALT 29  ALKPHOS 69  BILITOT 0.3  PROT 7.4  ALBUMIN 3.6   No results for input(s): LIPASE, AMYLASE in the last 168 hours. No results for input(s): AMMONIA in the last 168 hours. Coagulation Profile: No results for input(s): INR, PROTIME in the last 168 hours. Cardiac Enzymes: Recent Labs  Lab 12/06/17 1913  TROPONINI <0.03   BNP (last 3 results) No results for input(s): PROBNP in the last 8760 hours. HbA1C: No results for input(s): HGBA1C in the last 72 hours. CBG: No results for input(s): GLUCAP in the last 168 hours. Lipid Profile: No results for input(s): CHOL, HDL, LDLCALC, TRIG, CHOLHDL, LDLDIRECT in the last 72 hours. Thyroid Function Tests: No results for input(s): TSH, T4TOTAL, FREET4, T3FREE, THYROIDAB in the last 72 hours. Anemia Panel: No results for input(s): VITAMINB12, FOLATE, FERRITIN, TIBC, IRON, RETICCTPCT in the last 72 hours. Urine analysis:    Component Value Date/Time   COLORURINE YELLOW 11/26/2017 0422   APPEARANCEUR CLOUDY (A) 11/26/2017 0422   LABSPEC 1.011 11/26/2017 0422   PHURINE 8.0 11/26/2017 0422   GLUCOSEU NEGATIVE 11/26/2017 0422   HGBUR NEGATIVE 11/26/2017 0422   HGBUR trace-lysed 09/03/2008 0000   BILIRUBINUR NEGATIVE 11/26/2017 0422   BILIRUBINUR neg 06/02/2015 1610   KETONESUR NEGATIVE 11/26/2017 0422   PROTEINUR 30 (A) 11/26/2017  0422   UROBILINOGEN 0.2 06/02/2015 1610   UROBILINOGEN 0.2 09/03/2008 0000   NITRITE NEGATIVE 11/26/2017 0422   LEUKOCYTESUR LARGE (A) 11/26/2017 0422    Radiological Exams on Admission: Dg Chest 2 View  Result Date: 12/06/2017 CLINICAL DATA:  Right lower extremity swelling, intermittent pain EXAM: CHEST  2 VIEW COMPARISON:  01/23/2017 FINDINGS: There is a left-sided Port-A-Cath with the tip projecting over the SVC. There is no focal parenchymal opacity. There is no pleural effusion or pneumothorax. There is stable cardiomegaly. The osseous structures are unremarkable. IMPRESSION: No active cardiopulmonary disease. Electronically Signed   By: Kathreen Devoid   On: 12/06/2017 21:01   Dg Pelvis 1-2 Views  Result Date: 12/06/2017 CLINICAL DATA:  Pain.  No reported injury. EXAM: PELVIS - 1-2 VIEW COMPARISON:  05/02/2005 PET-CT. FINDINGS: No pelvic fracture or diastasis. No evidence of hip dislocation on this single frontal view. Mild osteoarthritis in the weight-bearing portions of both hip joints. No suspicious focal osseous lesions. Degenerative changes in the visualized lower lumbar spine. IMPRESSION: No fracture. Electronically Signed   By: Ilona Sorrel M.D.   On: 12/06/2017 20:57   Dg Ankle Complete Right  Result Date: 12/06/2017 CLINICAL DATA:  Right lower extremity pain and swelling EXAM: RIGHT ANKLE - COMPLETE 3+ VIEW COMPARISON:  None. FINDINGS: Circumferential soft tissue swelling. No fracture or dislocation. No sizable ankle effusion. IMPRESSION: Circumferential soft tissue swelling at the right ankle without acute osseous abnormality. Electronically Signed   By: Ulyses Jarred M.D.   On: 12/06/2017 20:55   Dg Foot Complete Right  Result Date: 12/06/2017 CLINICAL DATA:  Right lower extremity pain and swelling EXAM: RIGHT FOOT COMPLETE - 3+ VIEW COMPARISON:  None. FINDINGS: Severe osteopenia. No acute fracture or dislocation. Joint spaces are relatively well maintained. Severe soft tissue  swelling along the dorsal aspect of the foot. IMPRESSION: 1.  No acute osseous injury of the right foot. 2. Soft tissue swelling along the dorsal aspect of the foot. Electronically Signed   By: Kathreen Devoid   On: 12/06/2017 20:55    EKG: Independently reviewed. Vent. rate 74 BPM PR interval 216 ms QRS duration 82 ms QT/QTc 412/457 ms P-R-T axes 76 10 67 Sinus rhythm with 1st degree A-V block  with Premature atrial complexes Otherwise normal ECG  Assessment/Plan Principal Problem:   Edema of right lower extremity Observation/MedSurg. Received a single dose Lovenox SQ in the ER. Check venous Doppler of the right lower extremity to rule out DVT. Start anticoagulation therapy if needed or consider discharge home if negative.  Active Problems:   Hyperlipidemia LDL goal <100 Continue atorvastatin 40 mg p.o. daily. Monitor LFTs as needed. Follow-up fasting lipid profile per primary.    Anemia She had normal iron and ferritin in 2016. Monitor hematocrit and hemoglobin.    Essential hypertension Continue amlodipine 10 mg p.o. daily. Continue losartan 100 mg p.o. daily. Monitor blood pressure, BUN, creatinine and electrolytes.    Alzheimer's dementia Continue Aricept 10 mg p.o. daily.   Continue Namenda 5 mg p.o. twice daily. Supportive care.   DVT prophylaxis: Lovenox SQ. Code Status: Full code. Family Communication:  Disposition Plan: Observation for lower extremity venous doppler check (DVT R/O) Consults called:  Admission status: Observation/MedSurg.   Reubin Milan MD [facility] Pager [phone].  If 7PM-7AM, please contact night-coverage [email or web address] Password [facility]  12/06/2017, 11:23 PM

## 2017-12-06 NOTE — ED Notes (Signed)
Patient transported to X-ray 

## 2017-12-06 NOTE — ED Notes (Signed)
Ambulated pt  With 2 pers. Assist.  Pt was able to ambulate with this amount of support.  Family is at the bedside.

## 2017-12-06 NOTE — ED Triage Notes (Signed)
Pt sent to ED by Dr. Merlene Laughter. Pt reports continued weakness since discharge from hospital on 11/26/17. Pt also reports RLE swelling and intermittent pain.   Pt reports was supposed to be direct admit. Consult hospitalist,bed control and med-surg unit. Attempted to page Dr. Merlene Laughter. No note/conversation had regarding direct admission.

## 2017-12-06 NOTE — ED Provider Notes (Signed)
[facility] EMERGENCY DEPARTMENT Provider Note   CSN: [id number] Arrival date & time: 12/06/17  1839     History   Chief Complaint Chief Complaint  Patient presents with  . Weakness    HPI Emma Dawson is a 84 y.o. female.  Patient is brought in by her daughter because she is having difficulty ambulating for the last week.  She has swelling and pain in her right leg   The history is provided by the patient.  Illness  This is a new problem. The current episode started more than 2 days ago. The problem occurs constantly. The problem has not changed since onset.Pertinent negatives include no chest pain, no abdominal pain and no headaches. Nothing aggravates the symptoms. Nothing relieves the symptoms. She has tried nothing for the symptoms. The treatment provided no relief.    Past Medical History:  Diagnosis Date  . Allergic rhinitis   . Alzheimer's dementia   . Cancer ([place]) 03/2003   radiation and chemo ND mrm  . History of breast cancer   . History of chemotherapy    And radiation secondary to breast cancer  . Hyperlipidemia   . Hypertension   . IGT (impaired glucose tolerance) 2014   DIET MANAGED  . Normocytic anemia   . Osteoarthritis   . Port catheter in place 03/05/2013    Patient Active Problem List   Diagnosis Date Noted  . Edema of right lower extremity 12/06/2017  . Alzheimer's dementia 12/06/2017  . Incontinence in female 01/26/2017  . Knee pain, bilateral 12/16/2016  . Need for assistance due to unsteady gait 12/16/2016  . Recurrent falls while walking 12/16/2016  . Memory loss 06/02/2015  . Personal history of noncompliance with medical treatment, presenting hazards to health 09/27/2013  . Genital herpes 05/02/2008  . CAROTID ARTERY STENOSIS, LEFT 05/02/2008  . SYNCOPE, VASOVAGAL 05/02/2008  . CARDIAC MURMUR 05/02/2008  . Anemia 10/03/2007  . Hyperlipidemia LDL goal <100 09/27/2006  . Essential hypertension 09/27/2006  . Allergic rhinitis  09/27/2006  . Osteoarthritis 09/27/2006  . Invasive ductal carcinoma of breast ([place]) 09/27/2006    Past Surgical History:  Procedure Laterality Date  . ABDOMINAL HYSTERECTOMY  1970   Fibroid tumors, benign  . BREAST SURGERY Right 05/06/2003   MRM  . MASTECTOMY  2004   Right  . PORTACATH PLACEMENT      OB History    No data available       Home Medications    Prior to Admission medications   Medication Sig Start Date End Date Taking? Authorizing Provider  amLODipine (NORVASC) 10 MG tablet Take 1 tablet (10 mg total) daily by mouth. 10/09/17  Yes Fayrene Helper, MD  aspirin 81 MG tablet Take 81 mg by mouth daily.    Yes [provider]  atorvastatin (LIPITOR) 40 MG tablet Take 1 tablet (40 mg total) daily by mouth. 10/09/17  Yes Fayrene Helper, MD  cephALEXin (KEFLEX) 500 MG capsule Take 1 capsule (500 mg total) by mouth 3 (three) times daily. 11/26/17  Yes Rolland Porter, MD  docusate sodium (COLACE) 100 MG capsule Take 100 mg by mouth daily as needed for mild constipation.   Yes [provider]  donepezil (ARICEPT) 10 MG tablet Take 10 mg by mouth daily.  04/11/17  Yes [provider]  feeding supplement, ENSURE COMPLETE, (ENSURE COMPLETE) LIQD Take 237 mLs by mouth 2 (two) times daily between meals. 01/25/17  Yes Fayrene Helper, MD  losartan (COZAAR) 100  MG tablet Take 1 tablet (100 mg total) daily by mouth. 10/09/17  Yes Fayrene Helper, MD  memantine (NAMENDA) 5 MG tablet Take 5 mg by mouth 2 (two) times daily.   Yes [provider]  Multiple Vitamin (MULTIVITAMIN WITH MINERALS) TABS tablet Take 1 tablet by mouth daily.   Yes [provider]  Vitamin D, Ergocalciferol, (DRISDOL) 50000 units CAPS capsule TAKE (1) CAPSULE BY MOUTH ONCE EVERY 7 DAYS. 11/20/17  Yes Fayrene Helper, MD  zolpidem (AMBIEN) 5 MG tablet Take 1 tablet by mouth at bedtime.  02/28/17  Yes [provider]  acetaminophen (TYLENOL) 500 MG  tablet One tablet twice daily as needed for knee pain Patient taking differently: Take 500 mg by mouth every 8 (eight) hours as needed for mild pain or moderate pain.  05/08/12   Fayrene Helper, MD  Incontinence Supply Disposable (DISPOSABLE BRIEF LARGE) MISC Use as needed 03/17/17   Fayrene Helper, MD  Incontinence Supply Disposable (UNDERPADS) MISC Use as needed 03/17/17   Fayrene Helper, MD  UNABLE TO FIND Bed alarm dx- R29.6 01/25/17   Fayrene Helper, MD  UNABLE TO FIND Prevail Pads [phone] 11" 6 bags/96 pads per month Dx: urinary incontinence 06/29/17   Fayrene Helper, MD    Family History Family History  Problem Relation Age of Onset  . Stroke Mother   . Heart attack Father   . Coronary artery disease Father   . Coronary artery disease Brother   . Heart attack Sister   . Heart disease Sister   . Arthritis Sister   . Heart disease Sister   . Dementia Sister   . Lung cancer Brother   . Prostate cancer Brother     Social History Social History   Tobacco Use  . Smoking status: Never Smoker  . Smokeless tobacco: Never Used  Substance Use Topics  . Alcohol use: No  . Drug use: No     Allergies   Aricept [donepezil hcl]   Review of Systems Review of Systems  Constitutional: Negative for appetite change and fatigue.  HENT: Negative for congestion, ear discharge and sinus pressure.   Eyes: Negative for discharge.  Respiratory: Negative for cough.   Cardiovascular: Negative for chest pain.  Gastrointestinal: Negative for abdominal pain and diarrhea.  Genitourinary: Negative for frequency and hematuria.  Musculoskeletal: Negative for back pain.       Swelling in right leg around the foot and ankle and calf with discomfort  Skin: Negative for rash.  Neurological: Negative for seizures and headaches.  Psychiatric/Behavioral: Negative for hallucinations.     Physical Exam Updated Vital Signs BP (!) 137/56 (BP Location: Right Arm)   Pulse 72   Temp  97.7 F (36.5 C) (Oral)   Resp 16   Ht 5\' 2"  (1.575 m)   Wt 61.2 kg (135 lb)   SpO2 100%   BMI 24.69 kg/m   Physical Exam  Constitutional: She appears well-developed.  HENT:  Head: Normocephalic.  Eyes: Conjunctivae and EOM are normal. No scleral icterus.  Neck: Neck supple. No thyromegaly present.  Cardiovascular: Normal rate and regular rhythm. Exam reveals no gallop and no friction rub.  No murmur heard. Pulmonary/Chest: No stridor. She has no wheezes. She has no rales. She exhibits no tenderness.  Abdominal: She exhibits no distension. There is no tenderness. There is no rebound.  Musculoskeletal: Normal range of motion. She exhibits no edema.  Tenderness and swelling to calf and foot.  Neurovascular exam normal  Lymphadenopathy:    She has no cervical adenopathy.  Neurological: She is alert. She exhibits normal muscle tone. Coordination normal.  Patient has mild dementia she is oriented to person and place  Skin: No rash noted. No erythema.  Nursing note and vitals reviewed.    ED Treatments / Results  Labs (all labs ordered are listed, but only abnormal results are displayed) Labs Reviewed  CBC WITH DIFFERENTIAL/PLATELET - Abnormal; Notable for the following components:      Result Value   Hemoglobin 10.3 (*)    HCT 32.9 (*)    MCH 25.9 (*)    All other components within normal limits  COMPREHENSIVE METABOLIC PANEL - Abnormal; Notable for the following components:   Glucose, Bld 111 (*)    Creatinine, Ser 1.33 (*)    GFR calc non Af Amer 36 (*)    GFR calc Af Amer 42 (*)    All other components within normal limits  TROPONIN I    EKG  EKG Interpretation None       Radiology Dg Chest 2 View  Result Date: 12/06/2017 CLINICAL DATA:  Right lower extremity swelling, intermittent pain EXAM: CHEST  2 VIEW COMPARISON:  01/23/2017 FINDINGS: There is a left-sided Port-A-Cath with the tip projecting over the SVC. There is no focal parenchymal opacity. There is no  pleural effusion or pneumothorax. There is stable cardiomegaly. The osseous structures are unremarkable. IMPRESSION: No active cardiopulmonary disease. Electronically Signed   By: Kathreen Devoid   On: 12/06/2017 21:01   Dg Pelvis 1-2 Views  Result Date: 12/06/2017 CLINICAL DATA:  Pain.  No reported injury. EXAM: PELVIS - 1-2 VIEW COMPARISON:  05/02/2005 PET-CT. FINDINGS: No pelvic fracture or diastasis. No evidence of hip dislocation on this single frontal view. Mild osteoarthritis in the weight-bearing portions of both hip joints. No suspicious focal osseous lesions. Degenerative changes in the visualized lower lumbar spine. IMPRESSION: No fracture. Electronically Signed   By: Ilona Sorrel M.D.   On: 12/06/2017 20:57   Dg Ankle Complete Right  Result Date: 12/06/2017 CLINICAL DATA:  Right lower extremity pain and swelling EXAM: RIGHT ANKLE - COMPLETE 3+ VIEW COMPARISON:  None. FINDINGS: Circumferential soft tissue swelling. No fracture or dislocation. No sizable ankle effusion. IMPRESSION: Circumferential soft tissue swelling at the right ankle without acute osseous abnormality. Electronically Signed   By: Ulyses Jarred M.D.   On: 12/06/2017 20:55   Dg Foot Complete Right  Result Date: 12/06/2017 CLINICAL DATA:  Right lower extremity pain and swelling EXAM: RIGHT FOOT COMPLETE - 3+ VIEW COMPARISON:  None. FINDINGS: Severe osteopenia. No acute fracture or dislocation. Joint spaces are relatively well maintained. Severe soft tissue swelling along the dorsal aspect of the foot. IMPRESSION: 1.  No acute osseous injury of the right foot. 2. Soft tissue swelling along the dorsal aspect of the foot. Electronically Signed   By: Kathreen Devoid   On: 12/06/2017 20:55    Procedures Procedures (including critical care time)  Medications Ordered in ED Medications  enoxaparin (LOVENOX) injection 60 mg (not administered)     Initial Impression / Assessment and Plan / ED Course  I have reviewed the triage  vital signs and the nursing notes.  Pertinent labs & imaging results that were available during my care of the patient were reviewed by me and considered in my medical decision making (see chart for details).     Patient with swelling to right lower leg and unable to ambulate.  She will be admitted to medicine and have ultrasound of her leg tomorrow for possible DVT.  She is getting a shot of Lovenox tonight  Final Clinical Impressions(s) / ED Diagnoses   Final diagnoses:  Weakness    ED Discharge Orders    None       Milton Ferguson, MD 12/06/17 2329

## 2017-12-07 ENCOUNTER — Encounter (HOSPITAL_COMMUNITY): Payer: Medicare Other

## 2017-12-07 ENCOUNTER — Ambulatory Visit: Payer: Medicare Other | Admitting: Family Medicine

## 2017-12-07 ENCOUNTER — Observation Stay (HOSPITAL_COMMUNITY): Payer: Medicare Other

## 2017-12-07 DIAGNOSIS — I10 Essential (primary) hypertension: Secondary | ICD-10-CM

## 2017-12-07 DIAGNOSIS — F0280 Dementia in other diseases classified elsewhere without behavioral disturbance: Secondary | ICD-10-CM | POA: Diagnosis not present

## 2017-12-07 DIAGNOSIS — R600 Localized edema: Secondary | ICD-10-CM | POA: Diagnosis not present

## 2017-12-07 DIAGNOSIS — N183 Chronic kidney disease, stage 3 unspecified: Secondary | ICD-10-CM

## 2017-12-07 DIAGNOSIS — C50919 Malignant neoplasm of unspecified site of unspecified female breast: Secondary | ICD-10-CM | POA: Diagnosis not present

## 2017-12-07 DIAGNOSIS — E785 Hyperlipidemia, unspecified: Secondary | ICD-10-CM

## 2017-12-07 DIAGNOSIS — G308 Other Alzheimer's disease: Secondary | ICD-10-CM | POA: Diagnosis not present

## 2017-12-07 DIAGNOSIS — R531 Weakness: Principal | ICD-10-CM

## 2017-12-07 LAB — CBC WITH DIFFERENTIAL/PLATELET
BASOS PCT: 1 %
Basophils Absolute: 0 10*3/uL (ref 0.0–0.1)
EOS ABS: 0.6 10*3/uL (ref 0.0–0.7)
Eosinophils Relative: 7 %
HCT: 29.8 % — ABNORMAL LOW (ref 36.0–46.0)
HEMOGLOBIN: 9.3 g/dL — AB (ref 12.0–15.0)
Lymphocytes Relative: 27 %
Lymphs Abs: 2.2 10*3/uL (ref 0.7–4.0)
MCH: 25.9 pg — ABNORMAL LOW (ref 26.0–34.0)
MCHC: 31.2 g/dL (ref 30.0–36.0)
MCV: 83 fL (ref 78.0–100.0)
Monocytes Absolute: 0.7 10*3/uL (ref 0.1–1.0)
Monocytes Relative: 9 %
NEUTROS PCT: 56 %
Neutro Abs: 4.5 10*3/uL (ref 1.7–7.7)
Platelets: 310 10*3/uL (ref 150–400)
RBC: 3.59 MIL/uL — AB (ref 3.87–5.11)
RDW: 14.9 % (ref 11.5–15.5)
WBC: 8 10*3/uL (ref 4.0–10.5)

## 2017-12-07 LAB — BASIC METABOLIC PANEL
Anion gap: 8 (ref 5–15)
BUN: 17 mg/dL (ref 6–20)
CHLORIDE: 105 mmol/L (ref 101–111)
CO2: 25 mmol/L (ref 22–32)
CREATININE: 1.1 mg/dL — AB (ref 0.44–1.00)
Calcium: 9 mg/dL (ref 8.9–10.3)
GFR, EST AFRICAN AMERICAN: 52 mL/min — AB (ref >60)
GFR, EST NON AFRICAN AMERICAN: 45 mL/min — AB (ref >60)
Glucose, Bld: 88 mg/dL (ref 65–99)
POTASSIUM: 4 mmol/L (ref 3.5–5.1)
SODIUM: 138 mmol/L (ref 135–145)

## 2017-12-07 LAB — MRSA PCR SCREENING: MRSA by PCR: NEGATIVE

## 2017-12-07 LAB — VITAMIN B12: VITAMIN B 12: 450 pg/mL (ref 180–914)

## 2017-12-07 LAB — T4, FREE: Free T4: 1 ng/dL (ref 0.61–1.12)

## 2017-12-07 LAB — TSH: TSH: 1.334 u[IU]/mL (ref 0.350–4.500)

## 2017-12-07 MED ORDER — DOCUSATE SODIUM 100 MG PO CAPS: 100.0000 mg | ORAL_CAPSULE | Freq: Every day | ORAL | Status: DC | PRN

## 2017-12-07 MED ORDER — ZOLPIDEM TARTRATE 5 MG PO TABS
5.0000 mg | ORAL_TABLET | Freq: Every day | ORAL | Status: DC
  Administered 2017-12-07 – 2017-12-08 (×2): 5 mg via ORAL
  Filled 2017-12-07 (×2): qty 1

## 2017-12-07 MED ORDER — ATORVASTATIN CALCIUM 40 MG PO TABS
40.0000 mg | ORAL_TABLET | Freq: Every day | ORAL | Status: DC
  Administered 2017-12-07 – 2017-12-09 (×3): 40 mg via ORAL
  Filled 2017-12-07 (×3): qty 1

## 2017-12-07 MED ORDER — DONEPEZIL HCL 5 MG PO TABS
10.0000 mg | ORAL_TABLET | Freq: Every day | ORAL | Status: DC
  Administered 2017-12-07 – 2017-12-09 (×3): 10 mg via ORAL
  Filled 2017-12-07 (×3): qty 2

## 2017-12-07 MED ORDER — ASPIRIN EC 81 MG PO TBEC
81.0000 mg | DELAYED_RELEASE_TABLET | Freq: Every day | ORAL | Status: DC
  Administered 2017-12-07 – 2017-12-09 (×3): 81 mg via ORAL
  Filled 2017-12-07 (×3): qty 1

## 2017-12-07 MED ORDER — LOSARTAN POTASSIUM 50 MG PO TABS
100.0000 mg | ORAL_TABLET | Freq: Every day | ORAL | Status: DC
  Administered 2017-12-07 – 2017-12-09 (×3): 100 mg via ORAL
  Filled 2017-12-07 (×3): qty 2

## 2017-12-07 MED ORDER — ENSURE ENLIVE PO LIQD: 237.0000 mL | Freq: Two times a day (BID) | ORAL | Status: DC

## 2017-12-07 MED ORDER — CEPHALEXIN 500 MG PO CAPS
500.0000 mg | ORAL_CAPSULE | Freq: Three times a day (TID) | ORAL | Status: DC
  Administered 2017-12-07 – 2017-12-08 (×4): 500 mg via ORAL
  Filled 2017-12-07 (×13): qty 1

## 2017-12-07 MED ORDER — MEMANTINE HCL 10 MG PO TABS
5.0000 mg | ORAL_TABLET | Freq: Two times a day (BID) | ORAL | Status: DC
  Administered 2017-12-07 – 2017-12-09 (×6): 5 mg via ORAL
  Filled 2017-12-07 (×5): qty 1

## 2017-12-07 MED ORDER — AMLODIPINE BESYLATE 5 MG PO TABS
5.0000 mg | ORAL_TABLET | Freq: Every day | ORAL | Status: DC
  Administered 2017-12-07 – 2017-12-09 (×3): 5 mg via ORAL
  Filled 2017-12-07 (×3): qty 1

## 2017-12-07 MED ORDER — ACETAMINOPHEN 500 MG PO TABS: 500.0000 mg | ORAL_TABLET | Freq: Three times a day (TID) | ORAL | Status: DC | PRN

## 2017-12-07 MED ORDER — AMLODIPINE BESYLATE 5 MG PO TABS: 10.0000 mg | ORAL_TABLET | Freq: Every day | ORAL | Status: DC

## 2017-12-07 MED ORDER — SODIUM CHLORIDE 0.9% FLUSH
3.0000 mL | Freq: Two times a day (BID) | INTRAVENOUS | Status: DC
  Administered 2017-12-07 – 2017-12-09 (×5): 3 mL via INTRAVENOUS

## 2017-12-07 NOTE — Care Management Obs Status (Signed)
[place] NOTIFICATION   Patient Details  Name: Emma Dawson MRN: [id number] Date of Birth: [birth year]   Medicare Observation Status Notification Given:  Yes    Caine Barfield, Chauncey Reading, RN 12/07/2017, 11:58 AM

## 2017-12-07 NOTE — Care Management Note (Signed)
Case Management Note  Patient Details  Name: Emma Dawson MRN: [id number] Date of Birth: 1932-01-05  Subjective/Objective:  Adm with LE edema. Reports she is currently living with her niece as her home is having some repairs (ramp, well). She reports as having an aide for hours M-F and some on the weekends. She walks with RW. [place] to r/o DVT.        Bedside states niece called and wanted to speak to someone. Spoke with niece, she reports patient needs placement and would like to talk with SW. Patient has Medicare and Medicaid.             Action/Plan: Recommended for SNF.    Expected Discharge Date:   12/07/2017               Expected Discharge Plan:     In-House Referral:     Discharge planning Services  CM Consult  Post Acute Care Choice:    Choice offered to:     DME Arranged:    DME Agency:     HH Arranged:    HH Agency:     Status of Service:  In process, will continue to follow  If discussed at Long Length of Stay Meetings, dates discussed:    Additional Comments:  Stephen Turnbaugh, Chauncey Reading, RN 12/07/2017, 1:31 PM

## 2017-12-07 NOTE — Progress Notes (Signed)
PROGRESS NOTE  Emma Dawson:[id number] DOB: 1932-09-29 DOA: 12/06/2017 PCP: Fayrene Helper, MD  Brief History:  84 year old female with a history of hypertension, hyperlipidemia, dementia, impaired glucose tolerance, and breast cancer presented with 2-3-day history of generalized weakness and difficulty ambulating secondary to right lower extremity pain and edema.  The patient is a poor historian secondary to her cognitive impairment.  However, she is able to answer simple questions appropriately.  She denies any fevers, chills, chest pain, shortness breath, nausea, vomiting, diarrhea, abdominal pain, dysuria, hematuria.  In the emergency department, the patient was afebrile hemodynamically stable saturating 100% on room air.  BMP and CBC were unremarkable.  X-rays of the right ankle and foot were unremarkable for any osseous abnormality.  Chest x-ray was negative.  Assessment/Plan: Generalized weakness -PT evaluation -Serum B12 -TSH -Urinalysis -personally reviewed EKG-sinus, nonspecific T wave change -personally reviewed CXR--no infiltrates or edema  Right lower extremity edema -Venous duplex  CKD stage 3 -baseline creatinine 0.8-1.0  Essential hypertension -Continue amlodipine and losartan -Decrease amlodipine to 5 mg daily  Hyperlipidemia -Continue statin  Dementia -Continue Aricept and Namenda    Disposition Plan:   Home 1/17 or 1/18 Family Communication:  No Family at bedside--Total time spent 35 minutes.  Greater than 50% spent face to face counseling and coordinating care.   Consultants:  none  Code Status:  FULL  DVT Prophylaxis:  [place] Lovenox   Procedures: As Listed in Progress Note Above  Antibiotics: None    Subjective: Patient denies fevers, chills, headache, chest pain, dyspnea, nausea, vomiting, diarrhea, abdominal pain, dysuria, hematuria, hematochezia, and melena.   Objective: Vitals:   12/06/17 2122 12/07/17 0109 12/07/17  0222 12/07/17 0500  BP: (!) 137/56 (!) 115/53 109/88 (!) 145/59  Pulse: 72 78 76 74  Resp: 16 20 20 18   Temp:  98.5 F (36.9 C) 98 F (36.7 C) 97.9 F (36.6 C)  TempSrc:   Oral Oral  SpO2: 100% 94% 100% 99%  Weight:   66.4 kg (146 lb 6.2 oz)   Height:   5\' 2"  (1.575 m)    No intake or output data in the 24 hours ending 12/07/17 0758 Weight change:  Exam:   General:  Pt is alert, follows commands appropriately, not in acute distress  HEENT: No icterus, No thrush, No neck mass, [place]/AT  Cardiovascular: RRR, S1/S2, no rubs, no gallops  Respiratory: CTA bilaterally, no wheezing, no crackles, no rhonchi  Abdomen: Soft/+BS, non tender, non distended, no guarding  Extremities: 1 + RLE edema, No lymphangitis, No petechiae, No rashes, no synovitis   Data Reviewed: I have personally reviewed following labs and imaging studies Basic Metabolic Panel: Recent Labs  Lab 12/06/17 1913 12/07/17 0420  NA 138 138  K 3.9 4.0  CL 101 105  CO2 27 25  GLUCOSE 111* 88  BUN 20 17  CREATININE 1.33* 1.10*  CALCIUM 9.3 9.0   Liver Function Tests: Recent Labs  Lab 12/06/17 1913  AST 37  ALT 29  ALKPHOS 69  BILITOT 0.3  PROT 7.4  ALBUMIN 3.6   No results for input(s): LIPASE, AMYLASE in the last 168 hours. No results for input(s): AMMONIA in the last 168 hours. Coagulation Profile: No results for input(s): INR, PROTIME in the last 168 hours. CBC: Recent Labs  Lab 12/06/17 1913 12/07/17 0420  WBC 8.3 8.0  NEUTROABS 4.9 4.5  HGB 10.3* 9.3*  HCT 32.9* 29.8*  MCV  82.9 83.0  PLT 328 310   Cardiac Enzymes: Recent Labs  Lab 12/06/17 1913  TROPONINI <0.03   BNP: Invalid input(s): POCBNP CBG: No results for input(s): GLUCAP in the last 168 hours. HbA1C: No results for input(s): HGBA1C in the last 72 hours. Urine analysis:    Component Value Date/Time   COLORURINE YELLOW 11/26/2017 0422   APPEARANCEUR CLOUDY (A) 11/26/2017 0422   LABSPEC 1.011 11/26/2017 0422    PHURINE 8.0 11/26/2017 0422   GLUCOSEU NEGATIVE 11/26/2017 0422   HGBUR NEGATIVE 11/26/2017 0422   HGBUR trace-lysed 09/03/2008 0000   BILIRUBINUR NEGATIVE 11/26/2017 0422   BILIRUBINUR neg 06/02/2015 1610   KETONESUR NEGATIVE 11/26/2017 0422   PROTEINUR 30 (A) 11/26/2017 0422   UROBILINOGEN 0.2 06/02/2015 1610   UROBILINOGEN 0.2 09/03/2008 0000   NITRITE NEGATIVE 11/26/2017 0422   LEUKOCYTESUR LARGE (A) 11/26/2017 0422   Sepsis Labs: @LABRCNTIP (procalcitonin:4,lacticidven:4) )No results found for this or any previous visit (from the past 240 hour(s)).   Scheduled Meds: . amLODipine  10 mg Oral Daily  . aspirin EC  81 mg Oral Daily  . atorvastatin  40 mg Oral Daily  . cephALEXin  500 mg Oral TID  . donepezil  10 mg Oral Daily  . feeding supplement (ENSURE ENLIVE)  237 mL Oral BID BM  . losartan  100 mg Oral Daily  . memantine  5 mg Oral BID  . zolpidem  5 mg Oral QHS   Continuous Infusions:  Procedures/Studies: Dg Chest 2 View  Result Date: 12/06/2017 CLINICAL DATA:  Right lower extremity swelling, intermittent pain EXAM: CHEST  2 VIEW COMPARISON:  01/23/2017 FINDINGS: There is a left-sided Port-A-Cath with the tip projecting over the SVC. There is no focal parenchymal opacity. There is no pleural effusion or pneumothorax. There is stable cardiomegaly. The osseous structures are unremarkable. IMPRESSION: No active cardiopulmonary disease. Electronically Signed   By: Kathreen Devoid   On: 12/06/2017 21:01   Dg Pelvis 1-2 Views  Result Date: 12/06/2017 CLINICAL DATA:  Pain.  No reported injury. EXAM: PELVIS - 1-2 VIEW COMPARISON:  05/02/2005 PET-CT. FINDINGS: No pelvic fracture or diastasis. No evidence of hip dislocation on this single frontal view. Mild osteoarthritis in the weight-bearing portions of both hip joints. No suspicious focal osseous lesions. Degenerative changes in the visualized lower lumbar spine. IMPRESSION: No fracture. Electronically Signed   By: Ilona Sorrel M.D.    On: 12/06/2017 20:57   Dg Ankle Complete Right  Result Date: 12/06/2017 CLINICAL DATA:  Right lower extremity pain and swelling EXAM: RIGHT ANKLE - COMPLETE 3+ VIEW COMPARISON:  None. FINDINGS: Circumferential soft tissue swelling. No fracture or dislocation. No sizable ankle effusion. IMPRESSION: Circumferential soft tissue swelling at the right ankle without acute osseous abnormality. Electronically Signed   By: Ulyses Jarred M.D.   On: 12/06/2017 20:55   Dg Foot Complete Right  Result Date: 12/06/2017 CLINICAL DATA:  Right lower extremity pain and swelling EXAM: RIGHT FOOT COMPLETE - 3+ VIEW COMPARISON:  None. FINDINGS: Severe osteopenia. No acute fracture or dislocation. Joint spaces are relatively well maintained. Severe soft tissue swelling along the dorsal aspect of the foot. IMPRESSION: 1.  No acute osseous injury of the right foot. 2. Soft tissue swelling along the dorsal aspect of the foot. Electronically Signed   By: Kathreen Devoid   On: 12/06/2017 20:55    Orson Eva, DO  [facility] Pager [phone]  If 7PM-7AM, please contact night-coverage [email or web address] Password [facility] 12/07/2017, 7:58 AM   LOS:  0 days

## 2017-12-07 NOTE — Evaluation (Signed)
Physical Therapy Evaluation Patient Details Name: Emma Dawson MRN: [id number] DOB: [birth year] Today's Date: 12/07/2017   History of Present Illness  CHRISMA HURLOCK is a 84 y.o. female with medical history significant of allergic rhinitis, Alzheimer's dementia, breast cancer, hyperlipidemia, hypertension, impaired glucose tolerance, normocytic anemia, osteoarthritis who is referred by Dr. Merlene Laughter to the emergency department due to weakness since she was discharged from the hospital 11/26/2017 and right lower extremity edema/tenderness.  She is unable to provide much history, but when asked about symptoms denied headache, chest pain, dyspnea, abdominal pain, diarrhea, constipation, dysuria or frequency.    Clinical Impression  Patient demonstrates slow labored movement for sitting up at bedside and required Mod assist to scoot to bedside, limited to a few steps before fatiguing and having to sit before losing balance.  Patient tolerated sitting up in chair after therapy - RN notified.  Patient will benefit from continued physical therapy in hospital and recommended venue below to increase strength, balance, endurance for safe ADLs and gait.    Follow Up Recommendations SNF;Supervision/Assistance - 24 hour    Equipment Recommendations  None recommended by PT    Recommendations for Other Services       Precautions / Restrictions Precautions Precautions: Fall Restrictions Weight Bearing Restrictions: No      Mobility  Bed Mobility Overal bed mobility: Needs Assistance Bed Mobility: Supine to Sit     Supine to sit: Mod assist        Transfers Overall transfer level: Needs assistance Equipment used: Rolling walker (2 wheeled) Transfers: Stand Pivot Transfers;Sit to/from Stand Sit to Stand: Min assist Stand pivot transfers: Mod assist          Ambulation/Gait Ambulation/Gait assistance: Mod assist;Max assist Ambulation Distance (Feet): 5 Feet Assistive device: Rolling  walker (2 wheeled) Gait Pattern/deviations: Decreased step length - right;Decreased step length - left;Decreased stride length   Gait velocity interpretation: Below normal speed for age/gender General Gait Details: limited to 6-7 steps demonstrating unsteady gait with fatiguing of BLE due to weakness  Stairs            Wheelchair Mobility    Modified Rankin (Stroke Patients Only)       Balance Overall balance assessment: Needs assistance Sitting-balance support: Feet supported;No upper extremity supported Sitting balance-Leahy Scale: Good     Standing balance support: Bilateral upper extremity supported;During functional activity Standing balance-Leahy Scale: Poor Standing balance comment: fair/poor with RW                             Pertinent Vitals/Pain Pain Assessment: No/denies pain    Home Living Family/patient expects to be discharged to:: Private residence Living Arrangements: Other relatives(neice) Available Help at Discharge: Family(her neice) Type of Home: Apartment Home Access: Stairs to enter Entrance Stairs-Rails: Right;Left;Can reach both Entrance Stairs-Number of Steps: 13   Home Equipment: Walker - 2 wheels;Cane - single point;Wheelchair - [employer];Hospital bed;Shower seat Additional Comments: Patient staying with her necie for last 2 weeks, plans to return to her house with has ramp entrance, single story and had aides 8-6 pm X 7 days/week prior to moving in with neice.  Had to move in with her neice due to well needing repairs    Prior Function Level of Independence: Needs assistance   Gait / Transfers Assistance Needed: household gait with RW  ADL's / Homemaking Assistance Needed: assisted by neice, prior had aides 8-6 pm x 7 days/week when staying  at her house        Hand Dominance        Extremity/Trunk Assessment   Upper Extremity Assessment Upper Extremity Assessment: Generalized weakness    Lower  Extremity Assessment Lower Extremity Assessment: Generalized weakness    Cervical / Trunk Assessment Cervical / Trunk Assessment: Normal  Communication   Communication: No difficulties  Cognition Arousal/Alertness: Awake/alert Behavior During Therapy: WFL for tasks assessed/performed Overall Cognitive Status: Within Functional Limits for tasks assessed                                        General Comments      Exercises     Assessment/Plan    PT Assessment Patient needs continued PT services  PT Problem List Decreased strength;Decreased activity tolerance;Decreased balance;Decreased mobility       PT Treatment Interventions Gait training;Functional mobility training;Therapeutic activities;Therapeutic exercise;Patient/family education;Stair training    PT Goals (Current goals can be found in the Care Plan section)  Acute Rehab PT Goals Patient Stated Goal: return home with family to assist PT Goal Formulation: With patient Time For Goal Achievement: 12/21/17 Potential to Achieve Goals: Good    Frequency Min 3X/week   Barriers to discharge        Co-evaluation               AM-PAC PT "6 Clicks" Daily Activity  Outcome Measure Difficulty turning over in bed (including adjusting bedclothes, sheets and blankets)?: A Little Difficulty moving from lying on back to sitting on the side of the bed? : A Lot Difficulty sitting down on and standing up from a chair with arms (e.g., wheelchair, bedside commode, etc,.)?: A Lot Help needed moving to and from a bed to chair (including a wheelchair)?: A Lot Help needed walking in hospital room?: A Lot Help needed climbing 3-5 steps with a railing? : Total 6 Click Score: 12    End of Session   Activity Tolerance: Patient limited by fatigue Patient left: in chair;with call bell/phone within reach(RN aware patient left up in chair) Nurse Communication: Mobility status PT Visit Diagnosis: Unsteadiness on  feet (R26.81);Other abnormalities of gait and mobility (R26.89);Muscle weakness (generalized) (M62.81)    Time: 8889-1694 PT Time Calculation (min) (ACUTE ONLY): 24 min   Charges:   PT Evaluation $PT Eval Moderate Complexity: 1 Mod PT Treatments $Therapeutic Activity: 23-37 mins   PT G Codes:        3:45 PM, 12/28/17 Lonell Grandchild, MPT Physical Therapist with [facility] 336 [phone] office [phone] mobile phone

## 2017-12-07 NOTE — Plan of Care (Signed)
  Acute Rehab PT Goals(only PT should resolve) Pt Will Go Supine/Side To Sit 12/07/2017 1547 - Progressing by Lonell Grandchild, PT Flowsheets Taken 12/07/2017 1547  Pt will go Supine/Side to Sit with minimal assist Patient Will Transfer Sit To/From Stand 12/07/2017 1547 - Progressing by Lonell Grandchild, PT Flowsheets Taken 12/07/2017 1547  Patient will transfer sit to/from stand with minimal assist Pt Will Transfer Bed To Chair/Chair To Bed 12/07/2017 1547 - Progressing by Lonell Grandchild, PT Flowsheets Taken 12/07/2017 1547  Pt will Transfer Bed to Chair/Chair to Bed with min assist Pt Will Ambulate 12/07/2017 1547 - Progressing by Lonell Grandchild, PT Flowsheets Taken 12/07/2017 1547  Pt will Ambulate with modified independence;with rolling walker;25 feet  3:48 PM, 12/07/17 Lonell Grandchild, MPT Physical Therapist with [facility] [phone] office [phone] mobile phone

## 2017-12-08 ENCOUNTER — Observation Stay (HOSPITAL_COMMUNITY): Payer: Medicare Other

## 2017-12-08 DIAGNOSIS — F0280 Dementia in other diseases classified elsewhere without behavioral disturbance: Secondary | ICD-10-CM | POA: Diagnosis not present

## 2017-12-08 DIAGNOSIS — R600 Localized edema: Secondary | ICD-10-CM | POA: Diagnosis not present

## 2017-12-08 DIAGNOSIS — R531 Weakness: Secondary | ICD-10-CM | POA: Diagnosis not present

## 2017-12-08 DIAGNOSIS — G308 Other Alzheimer's disease: Secondary | ICD-10-CM | POA: Diagnosis not present

## 2017-12-08 DIAGNOSIS — R269 Unspecified abnormalities of gait and mobility: Secondary | ICD-10-CM | POA: Diagnosis not present

## 2017-12-08 DIAGNOSIS — R4182 Altered mental status, unspecified: Secondary | ICD-10-CM | POA: Diagnosis not present

## 2017-12-08 DIAGNOSIS — R4020 Unspecified coma: Secondary | ICD-10-CM | POA: Diagnosis not present

## 2017-12-08 DIAGNOSIS — N183 Chronic kidney disease, stage 3 (moderate): Secondary | ICD-10-CM | POA: Diagnosis not present

## 2017-12-08 LAB — URINALYSIS, COMPLETE (UACMP) WITH MICROSCOPIC
Bilirubin Urine: NEGATIVE
Glucose, UA: NEGATIVE mg/dL
KETONES UR: NEGATIVE mg/dL
NITRITE: NEGATIVE
Protein, ur: NEGATIVE mg/dL
Specific Gravity, Urine: 1.017 (ref 1.005–1.030)
pH: 6 (ref 5.0–8.0)

## 2017-12-08 MED ORDER — TUBERCULIN PPD 5 UNIT/0.1ML ID SOLN
5.0000 [IU] | Freq: Once | INTRADERMAL | Status: DC
  Administered 2017-12-08: 5 [IU] via INTRADERMAL
  Filled 2017-12-08: qty 0.1

## 2017-12-08 MED ORDER — AMLODIPINE BESYLATE 5 MG PO TABS: 5.0000 mg | ORAL_TABLET | Freq: Every day | ORAL | 0 refills | Status: DC

## 2017-12-08 MED ORDER — DEXTROSE 5 % IV SOLN
1.0000 g | INTRAVENOUS | Status: DC
  Administered 2017-12-08: 1 g via INTRAVENOUS
  Filled 2017-12-08 (×4): qty 10

## 2017-12-08 NOTE — Clinical Social Work Note (Signed)
LCSW working on placement for pt. Spoke with Aram Beecham at [facility]. Aram Beecham states that pt was appropriate for ALF when she assessed her two weeks ago but that she would need one of her RNs to come and re-evaluate pt since she is in the hospital. Per Aram Beecham, they would also need a PPD test placed. Unclear if it would also need to have been read prior to placement but this will be clarified.  Spoke with pt's niece, Lauro Regulus [phone]), to update on assessment needed by ALF RN. Asked Lauro Regulus again for a SNF choice for a backup plan in case the ALF can't accept pt at this time. Lauro Regulus states she is out looking at NHs now and she will let us know later today.   Lauro Regulus states that she is under the impression that the TB skin test will also have to have been read as negative prior to pt being able to admit to ALF or to LTC NH.   LCSW did submit for a PASARR number for pt in case SNF is the level of care needed and it was received. Also submitted to RSVP for ALF approval.   Spoke with Bethena Roys (RN from [facility]) and she will be here around 3:30 to evaluate pt.  LCSW, Heather Settle, to follow up this afternoon.

## 2017-12-08 NOTE — NC FL2 (Deleted)
[place] LEVEL OF CARE SCREENING TOOL     IDENTIFICATION  Patient Name: Emma Dawson Birthdate: 1932/08/20 Sex: female Admission Date (Current Location): 12/06/2017  [place] and [place] Number:  [facility] [id number] [place] and Address:  [facility] [address], [place]      Provider Number: [id number]  Attending Physician Name and Address:  Orson Eva, MD  Relative Name and Phone Number:  Joaquin Bend [phone]    Current Level of Care: Hospital Recommended Level of Care:   Prior Approval Number:    Date Approved/Denied:   PASRR Number: [id number]  Discharge Plan:      Current Diagnoses: Patient Active Problem List   Diagnosis Date Noted  . CKD (chronic kidney disease) stage 3, GFR 30-59 ml/min (HCC) 12/07/2017  . Generalized weakness 12/07/2017  . Edema of right lower extremity 12/06/2017  . Alzheimer's dementia 12/06/2017  . Incontinence in female 01/26/2017  . Knee pain, bilateral 12/16/2016  . Need for assistance due to unsteady gait 12/16/2016  . Recurrent falls while walking 12/16/2016  . Memory loss 06/02/2015  . Personal history of noncompliance with medical treatment, presenting hazards to health 09/27/2013  . Genital herpes 05/02/2008  . CAROTID ARTERY STENOSIS, LEFT 05/02/2008  . SYNCOPE, VASOVAGAL 05/02/2008  . CARDIAC MURMUR 05/02/2008  . Anemia 10/03/2007  . Hyperlipidemia LDL goal <100 09/27/2006  . Essential hypertension 09/27/2006  . Allergic rhinitis 09/27/2006  . Osteoarthritis 09/27/2006  . Invasive ductal carcinoma of breast ([place]) 09/27/2006    Orientation RESPIRATION BLADDER Height & Weight     Self, Place  Normal Continent Weight: 146 lb 6.2 oz (66.4 kg) Height:  5\' 2"  (157.5 cm)  BEHAVIORAL SYMPTOMS/MOOD NEUROLOGICAL BOWEL NUTRITION STATUS      Continent Diet(heart healthy)  AMBULATORY STATUS COMMUNICATION OF NEEDS Skin   Extensive Assist Verbally Normal                        Personal Care Assistance Level of Assistance  Bathing, Feeding, Dressing Bathing Assistance: Limited assistance Feeding assistance: Independent Dressing Assistance: Limited assistance     Functional Limitations Info  Sight, Hearing, Speech Sight Info: Adequate Hearing Info: Adequate Speech Info: Adequate    SPECIAL CARE FACTORS FREQUENCY                       Contractures Contractures Info: Not present    Additional Factors Info  Code Status, Allergies Code Status Info: full Allergies Info: aricept           Current Medications (12/08/2017):  This is the current hospital active medication list Current Facility-Administered Medications  Medication Dose Route Frequency Provider Last Rate Last Dose  . acetaminophen (TYLENOL) tablet 500 mg  500 mg Oral Q8H PRN Reubin Milan, MD      . amLODipine [facility]) tablet 5 mg  5 mg Oral Daily Tat, David, MD   5 mg at 12/08/17 0919  . aspirin EC tablet 81 mg  81 mg Oral Daily Reubin Milan, MD   81 mg at 12/08/17 0915  . atorvastatin (LIPITOR) tablet 40 mg  40 mg Oral Daily Reubin Milan, MD   40 mg at 12/08/17 0915  . docusate sodium (COLACE) capsule 100 mg  100 mg Oral Daily PRN Reubin Milan, MD      . donepezil (ARICEPT) tablet 10 mg  10 mg Oral Daily Reubin Milan, MD  10 mg at 12/08/17 0917  . feeding supplement (ENSURE ENLIVE) (ENSURE ENLIVE) liquid 237 mL  237 mL Oral BID BM Reubin Milan, MD      . losartan (COZAAR) tablet 100 mg  100 mg Oral Daily Reubin Milan, MD   100 mg at 12/08/17 0915  . memantine (NAMENDA) tablet 5 mg  5 mg Oral BID Reubin Milan, MD   5 mg at 12/08/17 1216  . sodium chloride flush (NS) 0.9 % injection 3 mL  3 mL Intravenous Therisa Doyne, MD   3 mL at 12/08/17 0932  . zolpidem (AMBIEN) tablet 5 mg  5 mg Oral QHS Reubin Milan, MD   5 mg at 12/07/17 2237   Facility-Administered Medications Ordered in Other Encounters  Medication Dose  Route Frequency Provider Last Rate Last Dose  . sodium chloride 0.9 % injection 10 mL  10 mL Intravenous PRN Baird Cancer, PA-C   10 mL at 03/23/12 0919     Discharge Medications: Please see discharge summary for a list of discharge medications.  Relevant Imaging Results:  Relevant Lab Results:   Additional Information SSN: [id number], Clydene Pugh, LCSW

## 2017-12-08 NOTE — Discharge Summary (Addendum)
Physician Discharge Summary  Emma Dawson UUV:[id number] DOB: 10-Nov-1932 DOA: 12/06/2017  PCP: Fayrene Helper, MD  Admit date: 12/06/2017 Discharge date:12/09/2017  Admitted From: Home Disposition:  [facility]  Recommendations for Outpatient Follow-up:  1. Follow up with PCP in 1-2 weeks 2. Please obtain BMP/CBC in one week   Home Health: No Equipment/Devices: WC  Discharge Condition: Stable CODE STATUS: FULL Diet recommendation: Heart Healthy   Brief/Interim Summary: 84 year old female with a history of hypertension, hyperlipidemia, dementia, impaired glucose tolerance, and breast cancer presented with 2-3-day history of generalized weakness and difficulty ambulating secondary to right lower extremity pain and edema. The patient is a poor historian secondary to her cognitive impairment. However, she is able to answer simple questions appropriately. She denies any fevers, chills, chest pain, shortness breath, nausea, vomiting, diarrhea, abdominal pain, dysuria, hematuria. In the emergency department, the patient was afebrile hemodynamically stable saturating 100% on room air. BMP and CBC were unremarkable. X-rays of the right ankle and foot were unremarkable for any osseous abnormality. Chest x-ray was negative.  The remainder of her metabolic workup was unremarkable.  Physical therapy was ordered, and they recommended skilled nursing facility vs assistance.  Neurology was consulted to assist with management.  Neurology ordered a CT of the brain which was negative for any acute findings.  They recommended no further workup other than physical therapy and treatment for possible UTI.    Discharge Diagnoses:  Generalized weakness/lower extremity weakness -PT evaluation recommended SNF -Serum B12--450 -TSH--1.334 -CK 94 -likely represents progression of her dementia with decrease functional ability -personally reviewed EKG-sinus, nonspecific T wave change -personally  reviewed CXR--no infiltrates or edema -likely due to deconditioning--neuro exam is non-focal and without cord level -I had long discussion with pt's niece (POA)--she was very irate and did not feel pt's home situation represented a safe discharge--she demanded neurology consult by Dr. Merlene Laughter whom I have called personally on 12/08/17 am -Niece only wants pt to go to [place];  She refuses for pt to go to any other SNF;  She also refuses d/c home as above -Neurology consulted-->CT brain-->neg acute findings; no additional work up recommended  Pyuria -Empirically treat for UTI per neurology recommendation -Not completely convinced she has UTI as the patient is asymptomatic from a urinary standpoint -started ceftriaxone during hospitalization -cefdinir x 3 more days after d/c to complete 5 days tx  Right lower extremity edema -Venous duplex--negative DVT  CKD stage 3 -baseline creatinine 0.8-1.0 -serum creatinine 1.10 at time of d/c  Essential hypertension -Continue amlodipine and losartan -Decrease amlodipine to 5 mg daily  Hyperlipidemia -Continue statin  Dementia -Continue Aricept and Namenda      Discharge Instructions  Discharge Instructions    Diet - low sodium heart healthy   Complete by:  As directed    Increase activity slowly   Complete by:  As directed      Allergies as of 12/09/2017      Reactions   Aricept [donepezil Hcl]    Hair loss       Medication List    STOP taking these medications   cephALEXin 500 MG capsule Commonly known as:  KEFLEX   Disposable Brief Large Misc   UNABLE TO FIND   Underpads Misc     TAKE these medications   acetaminophen 500 MG tablet Commonly known as:  TYLENOL One tablet twice daily as needed for knee pain What changed:    how much to take  how to take  this  when to take this  reasons to take this  additional instructions   amLODipine 5 MG tablet Commonly known as:  NORVASC Take 1 tablet  (5 mg total) by mouth daily. What changed:    medication strength  how much to take   aspirin 81 MG tablet Take 81 mg by mouth daily.   atorvastatin 40 MG tablet Commonly known as:  LIPITOR Take 1 tablet (40 mg total) by mouth daily.   cefdinir 300 MG capsule Commonly known as:  OMNICEF Take 1 capsule (300 mg total) by mouth 2 (two) times daily.   docusate sodium 100 MG capsule Commonly known as:  COLACE Take 100 mg by mouth daily as needed for mild constipation.   donepezil 10 MG tablet Commonly known as:  ARICEPT Take 1 tablet (10 mg total) by mouth daily.   feeding supplement (ENSURE COMPLETE) Liqd Take 237 mLs by mouth 2 (two) times daily between meals.   losartan 100 MG tablet Commonly known as:  COZAAR Take 1 tablet (100 mg total) by mouth daily.   memantine 5 MG tablet Commonly known as:  NAMENDA Take 1 tablet (5 mg total) by mouth 2 (two) times daily.   multivitamin with minerals Tabs tablet Take 1 tablet by mouth daily.   UNABLE TO FIND Prevail Pads BC013 11" 6 bags/96 pads per month Dx: urinary incontinence   Vitamin D (Ergocalciferol) 50000 units Caps capsule Commonly known as:  DRISDOL Take 1 capsule (50,000 Units total) by mouth every 7 (seven) days. Start taking on:  12/14/2017 What changed:  See the new instructions.   zolpidem 5 MG tablet Commonly known as:  AMBIEN Take 1 tablet (5 mg total) by mouth at bedtime.      Contact information for after-discharge care    Destination    [facility] .   Service:  Assisted Living Contact information: [place] [address] [phone]             Allergies  Allergen Reactions  . Aricept [Donepezil Hcl]     Hair loss     Consultations:  neurology   Procedures/Studies: Dg Chest 2 View  Result Date: 12/06/2017 CLINICAL DATA:  Right lower extremity swelling, intermittent pain EXAM: CHEST  2 VIEW COMPARISON:  01/23/2017 FINDINGS:  There is a left-sided Port-A-Cath with the tip projecting over the SVC. There is no focal parenchymal opacity. There is no pleural effusion or pneumothorax. There is stable cardiomegaly. The osseous structures are unremarkable. IMPRESSION: No active cardiopulmonary disease. Electronically Signed   By: Kathreen Devoid   On: 12/06/2017 21:01   Dg Pelvis 1-2 Views  Result Date: 12/06/2017 CLINICAL DATA:  Pain.  No reported injury. EXAM: PELVIS - 1-2 VIEW COMPARISON:  05/02/2005 PET-CT. FINDINGS: No pelvic fracture or diastasis. No evidence of hip dislocation on this single frontal view. Mild osteoarthritis in the weight-bearing portions of both hip joints. No suspicious focal osseous lesions. Degenerative changes in the visualized lower lumbar spine. IMPRESSION: No fracture. Electronically Signed   By: Ilona Sorrel M.D.   On: 12/06/2017 20:57   Dg Ankle Complete Right  Result Date: 12/06/2017 CLINICAL DATA:  Right lower extremity pain and swelling EXAM: RIGHT ANKLE - COMPLETE 3+ VIEW COMPARISON:  None. FINDINGS: Circumferential soft tissue swelling. No fracture or dislocation. No sizable ankle effusion. IMPRESSION: Circumferential soft tissue swelling at the right ankle without acute osseous abnormality. Electronically Signed   By: Cletus Gash.D.  On: 12/06/2017 20:55   Ct Head Wo Contrast  Result Date: 12/08/2017 CLINICAL DATA:  Dementia. Altered level consciousness. Assess for stroke. History of breast cancer. EXAM: CT HEAD WITHOUT CONTRAST TECHNIQUE: Contiguous axial images were obtained from the base of the skull through the vertex without intravenous contrast. COMPARISON:  CT HEAD April 28, 2017 FINDINGS: BRAIN: No intraparenchymal hemorrhage, mass effect nor midline shift. Moderate to severe ventriculomegaly with similar disproportionate enlargement of the frontal horns of lateral ventricles. Patchy supratentorial white matter hypodensities. No acute large vascular territory infarcts. No abnormal  extra-axial fluid collections. Basal cisterns are patent. VASCULAR: Mild-to-moderate calcific atherosclerosis of the carotid siphons. SKULL: No skull fracture. No significant scalp soft tissue swelling. SINUSES/ORBITS: Trace paranasal sinus mucosal thickening. Mastoid air cells are well aerated.The included ocular globes and orbital contents are non-suspicious. OTHER: None. IMPRESSION: 1. No acute intracranial process. 2. Similar moderate to severe parenchymal brain volume loss, disproportionately affecting the frontal lobes associated with neurodegenerative disorders. 3. moderate chronic small vessel ischemic disease. Electronically Signed   By: Elon Alas M.D.   On: 12/08/2017 19:05   US Venous Img Lower Unilateral Right  Result Date: 12/07/2017 CLINICAL DATA:  Intermittent right lower extremity pain and edema for the past week. EXAM: RIGHT LOWER EXTREMITY VENOUS DOPPLER ULTRASOUND TECHNIQUE: Gray-scale sonography with graded compression, as well as color Doppler and duplex ultrasound were performed to evaluate the lower extremity deep venous systems from the level of the common femoral vein and including the common femoral, femoral, profunda femoral, popliteal and calf veins including the posterior tibial, peroneal and gastrocnemius veins when visible. The superficial great saphenous vein was also interrogated. Spectral Doppler was utilized to evaluate flow at rest and with distal augmentation maneuvers in the common femoral, femoral and popliteal veins. COMPARISON:  None. FINDINGS: Contralateral Common Femoral Vein: Respiratory phasicity is normal and symmetric with the symptomatic side. No evidence of thrombus. Normal compressibility. Common Femoral Vein: No evidence of thrombus. Normal compressibility, respiratory phasicity and response to augmentation. Saphenofemoral Junction: No evidence of thrombus. Normal compressibility and flow on color Doppler imaging. Profunda Femoral Vein: No evidence of  thrombus. Normal compressibility and flow on color Doppler imaging. Femoral Vein: No evidence of thrombus. Normal compressibility, respiratory phasicity and response to augmentation. Popliteal Vein: No evidence of thrombus. Normal compressibility, respiratory phasicity and response to augmentation. Calf Veins: No evidence of thrombus. Normal compressibility and flow on color Doppler imaging. Superficial Great Saphenous Vein: No evidence of thrombus. Normal compressibility. Venous Reflux:  None. Other Findings:  None. IMPRESSION: No evidence of DVT within the right lower extremity. Electronically Signed   By: Sandi Mariscal M.D.   On: 12/07/2017 11:48   Dg Foot Complete Right  Result Date: 12/06/2017 CLINICAL DATA:  Right lower extremity pain and swelling EXAM: RIGHT FOOT COMPLETE - 3+ VIEW COMPARISON:  None. FINDINGS: Severe osteopenia. No acute fracture or dislocation. Joint spaces are relatively well maintained. Severe soft tissue swelling along the dorsal aspect of the foot. IMPRESSION: 1.  No acute osseous injury of the right foot. 2. Soft tissue swelling along the dorsal aspect of the foot. Electronically Signed   By: Kathreen Devoid   On: 12/06/2017 20:55        Discharge Exam: Vitals:   12/09/17 0400 12/09/17 0747  BP: 122/60 130/60  Pulse: 66   Resp: 18 18  Temp: 98.6 F (37 C) 98.6 F (37 C)  SpO2: 99%    Vitals:   12/08/17 2000 12/09/17 0000 12/09/17 0400  12/09/17 0747  BP: 134/68 (!) 128/50 122/60 130/60  Pulse: 70 65 66   Resp: 18 20 18 18   Temp: 98.3 F (36.8 C) 98.3 F (36.8 C) 98.6 F (37 C) 98.6 F (37 C)  TempSrc: Oral Oral  Oral  SpO2: 99% 100% 99%   Weight:   66.3 kg (146 lb 2.6 oz)   Height:        General: Pt is alert, awake, not in acute distress Cardiovascular: RRR, S1/S2 +, no rubs, no gallops Respiratory: CTA bilaterally, no wheezing, no rhonchi Abdominal: Soft, NT, ND, bowel sounds + Extremities: no edema, no cyanosis; no synovitis or edema of right knee  or right ankle; no erythema of bilateral LEs   The results of significant diagnostics from this hospitalization (including imaging, microbiology, ancillary and laboratory) are listed below for reference.    Significant Diagnostic Studies: Dg Chest 2 View  Result Date: 12/06/2017 CLINICAL DATA:  Right lower extremity swelling, intermittent pain EXAM: CHEST  2 VIEW COMPARISON:  01/23/2017 FINDINGS: There is a left-sided Port-A-Cath with the tip projecting over the SVC. There is no focal parenchymal opacity. There is no pleural effusion or pneumothorax. There is stable cardiomegaly. The osseous structures are unremarkable. IMPRESSION: No active cardiopulmonary disease. Electronically Signed   By: Kathreen Devoid   On: 12/06/2017 21:01   Dg Pelvis 1-2 Views  Result Date: 12/06/2017 CLINICAL DATA:  Pain.  No reported injury. EXAM: PELVIS - 1-2 VIEW COMPARISON:  05/02/2005 PET-CT. FINDINGS: No pelvic fracture or diastasis. No evidence of hip dislocation on this single frontal view. Mild osteoarthritis in the weight-bearing portions of both hip joints. No suspicious focal osseous lesions. Degenerative changes in the visualized lower lumbar spine. IMPRESSION: No fracture. Electronically Signed   By: Ilona Sorrel M.D.   On: 12/06/2017 20:57   Dg Ankle Complete Right  Result Date: 12/06/2017 CLINICAL DATA:  Right lower extremity pain and swelling EXAM: RIGHT ANKLE - COMPLETE 3+ VIEW COMPARISON:  None. FINDINGS: Circumferential soft tissue swelling. No fracture or dislocation. No sizable ankle effusion. IMPRESSION: Circumferential soft tissue swelling at the right ankle without acute osseous abnormality. Electronically Signed   By: Ulyses Jarred M.D.   On: 12/06/2017 20:55   Ct Head Wo Contrast  Result Date: 12/08/2017 CLINICAL DATA:  Dementia. Altered level consciousness. Assess for stroke. History of breast cancer. EXAM: CT HEAD WITHOUT CONTRAST TECHNIQUE: Contiguous axial images were obtained from the base  of the skull through the vertex without intravenous contrast. COMPARISON:  CT HEAD April 28, 2017 FINDINGS: BRAIN: No intraparenchymal hemorrhage, mass effect nor midline shift. Moderate to severe ventriculomegaly with similar disproportionate enlargement of the frontal horns of lateral ventricles. Patchy supratentorial white matter hypodensities. No acute large vascular territory infarcts. No abnormal extra-axial fluid collections. Basal cisterns are patent. VASCULAR: Mild-to-moderate calcific atherosclerosis of the carotid siphons. SKULL: No skull fracture. No significant scalp soft tissue swelling. SINUSES/ORBITS: Trace paranasal sinus mucosal thickening. Mastoid air cells are well aerated.The included ocular globes and orbital contents are non-suspicious. OTHER: None. IMPRESSION: 1. No acute intracranial process. 2. Similar moderate to severe parenchymal brain volume loss, disproportionately affecting the frontal lobes associated with neurodegenerative disorders. 3. moderate chronic small vessel ischemic disease. Electronically Signed   By: Elon Alas M.D.   On: 12/08/2017 19:05   US Venous Img Lower Unilateral Right  Result Date: 12/07/2017 CLINICAL DATA:  Intermittent right lower extremity pain and edema for the past week. EXAM: RIGHT LOWER EXTREMITY VENOUS DOPPLER ULTRASOUND TECHNIQUE: Gray-scale  sonography with graded compression, as well as color Doppler and duplex ultrasound were performed to evaluate the lower extremity deep venous systems from the level of the common femoral vein and including the common femoral, femoral, profunda femoral, popliteal and calf veins including the posterior tibial, peroneal and gastrocnemius veins when visible. The superficial great saphenous vein was also interrogated. Spectral Doppler was utilized to evaluate flow at rest and with distal augmentation maneuvers in the common femoral, femoral and popliteal veins. COMPARISON:  None. FINDINGS: Contralateral Common  Femoral Vein: Respiratory phasicity is normal and symmetric with the symptomatic side. No evidence of thrombus. Normal compressibility. Common Femoral Vein: No evidence of thrombus. Normal compressibility, respiratory phasicity and response to augmentation. Saphenofemoral Junction: No evidence of thrombus. Normal compressibility and flow on color Doppler imaging. Profunda Femoral Vein: No evidence of thrombus. Normal compressibility and flow on color Doppler imaging. Femoral Vein: No evidence of thrombus. Normal compressibility, respiratory phasicity and response to augmentation. Popliteal Vein: No evidence of thrombus. Normal compressibility, respiratory phasicity and response to augmentation. Calf Veins: No evidence of thrombus. Normal compressibility and flow on color Doppler imaging. Superficial Great Saphenous Vein: No evidence of thrombus. Normal compressibility. Venous Reflux:  None. Other Findings:  None. IMPRESSION: No evidence of DVT within the right lower extremity. Electronically Signed   By: Sandi Mariscal M.D.   On: 12/07/2017 11:48   Dg Foot Complete Right  Result Date: 12/06/2017 CLINICAL DATA:  Right lower extremity pain and swelling EXAM: RIGHT FOOT COMPLETE - 3+ VIEW COMPARISON:  None. FINDINGS: Severe osteopenia. No acute fracture or dislocation. Joint spaces are relatively well maintained. Severe soft tissue swelling along the dorsal aspect of the foot. IMPRESSION: 1.  No acute osseous injury of the right foot. 2. Soft tissue swelling along the dorsal aspect of the foot. Electronically Signed   By: Kathreen Devoid   On: 12/06/2017 20:55     Microbiology: Recent Results (from the past 240 hour(s))  MRSA PCR Screening     Status: None   Collection Time: 12/07/17  2:10 AM  Result Value Ref Range Status   MRSA by PCR NEGATIVE NEGATIVE Final    Comment:        The GeneXpert MRSA Assay (FDA approved for NASAL specimens only), is one component of a comprehensive MRSA  colonization surveillance program. It is not intended to diagnose MRSA infection nor to guide or monitor treatment for MRSA infections.      Labs: Basic Metabolic Panel: Recent Labs  Lab 12/06/17 1913 12/07/17 0420 12/09/17 0444  NA 138 138 139  K 3.9 4.0 3.8  CL 101 105 106  CO2 27 25 26   GLUCOSE 111* 88 88  BUN 20 17 15   CREATININE 1.33* 1.10* 0.88  CALCIUM 9.3 9.0 9.0   Liver Function Tests: Recent Labs  Lab 12/06/17 1913  AST 37  ALT 29  ALKPHOS 69  BILITOT 0.3  PROT 7.4  ALBUMIN 3.6   No results for input(s): LIPASE, AMYLASE in the last 168 hours. No results for input(s): AMMONIA in the last 168 hours. CBC: Recent Labs  Lab 12/06/17 1913 12/07/17 0420  WBC 8.3 8.0  NEUTROABS 4.9 4.5  HGB 10.3* 9.3*  HCT 32.9* 29.8*  MCV 82.9 83.0  PLT 328 310   Cardiac Enzymes: Recent Labs  Lab 12/06/17 1913 12/09/17 0444  CKTOTAL  --  94  TROPONINI <0.03  --    BNP: Invalid input(s): POCBNP CBG: No results for input(s): GLUCAP in the last  168 hours.  Time coordinating discharge:  Greater than 30 minutes  Signed:  Orson Eva, DO [facility] Pager: [phone] 12/09/2017, 10:29 AM

## 2017-12-08 NOTE — NC FL2 (Deleted)
[place] LEVEL OF CARE SCREENING TOOL     IDENTIFICATION  Patient Name: Emma Dawson Birthdate: [birth year] Sex: female Admission Date (Current Location): 12/06/2017  [place] and [place] Number:  [place] [id number] [place] and Address:  [facility] [address], [place]      Provider Number: [id number]  Attending Physician Name and Address:  Orson Eva, MD  Relative Name and Phone Number:  Joaquin [place] [phone]    Current Level of Care: Hospital Recommended Level of Care:   Prior Approval Number:    Date Approved/Denied:   PASRR Number: [id number]  Discharge Plan:      Current Diagnoses: Patient Active Problem List   Diagnosis Date Noted  . CKD (chronic kidney disease) stage 3, GFR 30-59 ml/min (HCC) 12/07/2017  . Generalized weakness 12/07/2017  . Edema of right lower extremity 12/06/2017  . Alzheimer's dementia 12/06/2017  . Incontinence in female 01/26/2017  . Knee pain, bilateral 12/16/2016  . Need for assistance due to unsteady gait 12/16/2016  . Recurrent falls while walking 12/16/2016  . Memory loss 06/02/2015  . Personal history of noncompliance with medical treatment, presenting hazards to health 09/27/2013  . Genital herpes 05/02/2008  . CAROTID ARTERY STENOSIS, LEFT 05/02/2008  . SYNCOPE, VASOVAGAL 05/02/2008  . CARDIAC MURMUR 05/02/2008  . Anemia 10/03/2007  . Hyperlipidemia LDL goal <100 09/27/2006  . Essential hypertension 09/27/2006  . Allergic rhinitis 09/27/2006  . Osteoarthritis 09/27/2006  . Invasive ductal carcinoma of breast ([place]) 09/27/2006    Orientation RESPIRATION BLADDER Height & Weight     Self, Place  Normal Continent Weight: 146 lb 6.2 oz (66.4 kg) Height:  5\' 2"  (157.5 cm)  BEHAVIORAL SYMPTOMS/MOOD NEUROLOGICAL BOWEL NUTRITION STATUS      Continent Diet(heart healthy)  AMBULATORY STATUS COMMUNICATION OF NEEDS Skin   Extensive Assist Verbally Normal                        Personal Care Assistance Level of Assistance  Bathing, Feeding, Dressing Bathing Assistance: Limited assistance Feeding assistance: Independent Dressing Assistance: Limited assistance     Functional Limitations Info  Sight, Hearing, Speech Sight Info: Adequate Hearing Info: Adequate Speech Info: Adequate    SPECIAL CARE FACTORS FREQUENCY                       Contractures Contractures Info: Not present    Additional Factors Info  Code Status, Allergies Code Status Info: full Allergies Info: aricept           Current Medications (12/08/2017):  This is the current hospital active medication list Current Facility-Administered Medications  Medication Dose Route Frequency Provider Last Rate Last Dose  . acetaminophen (TYLENOL) tablet 500 mg  500 mg Oral Q8H PRN Reubin Milan, MD      . amLODipine [facility]) tablet 5 mg  5 mg Oral Daily Tat, David, MD   5 mg at 12/08/17 0919  . aspirin EC tablet 81 mg  81 mg Oral Daily Reubin Milan, MD   81 mg at 12/08/17 0915  . atorvastatin (LIPITOR) tablet 40 mg  40 mg Oral Daily Reubin Milan, MD   40 mg at 12/08/17 0915  . docusate sodium (COLACE) capsule 100 mg  100 mg Oral Daily PRN Reubin Milan, MD      . donepezil (ARICEPT) tablet 10 mg  10 mg Oral Daily Reubin Milan, MD  10 mg at 12/08/17 0917  . feeding supplement (ENSURE ENLIVE) (ENSURE ENLIVE) liquid 237 mL  237 mL Oral BID BM Reubin Milan, MD      . losartan (COZAAR) tablet 100 mg  100 mg Oral Daily Reubin Milan, MD   100 mg at 12/08/17 0915  . memantine (NAMENDA) tablet 5 mg  5 mg Oral BID Reubin Milan, MD   5 mg at 12/08/17 1884  . sodium chloride flush (NS) 0.9 % injection 3 mL  3 mL Intravenous Therisa Doyne, MD   3 mL at 12/08/17 0932  . zolpidem (AMBIEN) tablet 5 mg  5 mg Oral QHS Reubin Milan, MD   5 mg at 12/07/17 2237   Facility-Administered Medications Ordered in Other Encounters  Medication Dose  Route Frequency Provider Last Rate Last Dose  . sodium chloride 0.9 % injection 10 mL  10 mL Intravenous PRN Baird Cancer, PA-C   10 mL at 03/23/12 0919     Discharge Medications: TAKE these medications   acetaminophen 500 MG tablet Commonly known as:  TYLENOL One tablet twice daily as needed for knee pain What changed:    how much to take  how to take this  when to take this  reasons to take this  additional instructions   amLODipine 5 MG tablet Commonly known as:  NORVASC Take 1 tablet (5 mg total) by mouth daily. Start taking on:  12/09/2017 What changed:    medication strength  how much to take   aspirin 81 MG tablet Take 81 mg by mouth daily.   atorvastatin 40 MG tablet Commonly known as:  LIPITOR Take 1 tablet (40 mg total) daily by mouth.   docusate sodium 100 MG capsule Commonly known as:  COLACE Take 100 mg by mouth daily as needed for mild constipation.   donepezil 10 MG tablet Commonly known as:  ARICEPT Take 10 mg by mouth daily.   feeding supplement (ENSURE COMPLETE) Liqd Take 237 mLs by mouth 2 (two) times daily between meals.   losartan 100 MG tablet Commonly known as:  COZAAR Take 1 tablet (100 mg total) daily by mouth.   memantine 5 MG tablet Commonly known as:  NAMENDA Take 5 mg by mouth 2 (two) times daily.   multivitamin with minerals Tabs tablet Take 1 tablet by mouth daily.   Vitamin D (Ergocalciferol) 50000 units Caps capsule Commonly known as:  DRISDOL TAKE (1) CAPSULE BY MOUTH ONCE EVERY 7 DAYS.   zolpidem 5 MG tablet Commonly known as:  AMBIEN Take 1 tablet by mouth at bedtime.         Relevant Imaging Results:  Relevant Lab Results:   Additional Information SSN: [id number] Clydene Pugh, LCSW

## 2017-12-08 NOTE — Consult Note (Signed)
[place] A. Merlene Laughter, MD     [email or web address]          Emma Dawson is an 84 y.o. female.   ASSESSMENT/PLAN: 1. Acute on chronic gait impairment: Etiology most likely multifactorial including swelling and also arthritis of the right ankle, UTI and possible subcortical stroke. Agree with the need for rehabilitation and placement for 3 weeks. I would obtain a head CT scan to rule out for acute stroke. Consider treatment of UTI. Minimize psychotropic medications. Continue with Aricept and memantine.  2. Advanced Alzheimer's dementia:  3. Baseline multifactorial gait impairment that is chronic:     Patient is a 84 year old black female who is known to my service from the outpatient setting. She has had 3 encounters in the emergency room over the last couple weeks for gait impairment and swelling of the right ankle. She was treated and released the initial 2 times with the family called stating that they cannot handle the patient because of worsening balance and gait problems despite the fact that her swelling has improved. She also has had increasing agitation and confusion over baseline. The patient herself reports that her legs are weak and she continues to complain of soreness and pain of the right ankle although there is no swelling today. She otherwise thinks she is at baseline. The review systems is limited given the baseline cognitive impairment but otherwise unrevealing.   GENERAL: This is patient is currently in no acute distress. She cooperates with evaluation. She does recognizes me as a physician but thinks I'm Dr. Aline Brochure.  HEENT: No trauma neck is supple  ABDOMEN: soft  EXTREMITIES: No edema; there is marked arthritic changes of the knees bilaterally and of the ankles. There is mild soreness on palpation involving the ankles.   BACK: This is normal.  SKIN: Normal by inspection.    MENTAL STATUS: She is awake and alert. She knows that she is in  the hospital but not oriented to time.  CRANIAL NERVES: Pupils are 5 mm on the right and 3 on the left and they are round and reactive; extra ocular movements are full, there is no significant nystagmus; visual fields are full; upper and lower facial muscles are normal in strength and symmetric, there is no flattening of the nasolabial folds; tongue is midline; uvula is midline; shoulder elevation is normal.  MOTOR: Bulk and tone are normal throughout. She has 4/5 strength of the deltoids and hand grip bilaterally. Right hip flexion is 4 minus and the left 4+. Dorsiflexion is 4+ bilaterally.  COORDINATION: Left finger to nose is normal, right finger to nose is normal, No rest tremor; no intention tremor; no postural tremor; no bradykinesia.  REFLEXES: Deep tendon reflexes are symmetrical and normal. Babinski reflexes are flexor bilaterally.   SENSATION: Normal to painful stimulation bilaterally.  GAIT: She is unable to stand even with using her walker. I was able to interrupt with a walker physically pulling her up but she was still off balance and sat back in the chair. She was unable to take any steps at this time.     Blood pressure (!) 137/48, pulse 65, temperature 98.2 F (36.8 C), temperature source Oral, resp. rate 20, height _0  (1.575 m), weight 146 lb 6.2 oz (66.4 kg), SpO2 100 %.  Past Medical History:  Diagnosis Date  . Allergic rhinitis   . Alzheimer's dementia   . Cancer ([place]) 03/2003   radiation and chemo ND mrm  . History of breast  cancer   . History of chemotherapy    And radiation secondary to breast cancer  . Hyperlipidemia   . Hypertension   . IGT (impaired glucose tolerance) 2014   DIET MANAGED  . Normocytic anemia   . Osteoarthritis   . Port catheter in place 03/05/2013    Past Surgical History:  Procedure Laterality Date  . ABDOMINAL HYSTERECTOMY  1970   Fibroid tumors, benign  . BREAST SURGERY Right 05/06/2003   MRM  . MASTECTOMY  2004   Right  .  PORTACATH PLACEMENT      Family History  Problem Relation Age of Onset  . Stroke Mother   . Heart attack Father   . Coronary artery disease Father   . Coronary artery disease Brother   . Heart attack Sister   . Heart disease Sister   . Arthritis Sister   . Heart disease Sister   . Dementia Sister   . Lung cancer Brother   . Prostate cancer Brother     Social History:  reports that  has never smoked. she has never used smokeless tobacco. She reports that she does not drink alcohol or use drugs.  Allergies:  Allergies  Allergen Reactions  . Aricept [Donepezil Hcl]     Hair loss     Medications: Prior to Admission medications   Medication Sig Start Date End Date Taking? Authorizing Provider  amLODipine (NORVASC) 10 MG tablet Take 1 tablet (10 mg total) daily by mouth. 10/09/17  Yes Fayrene Helper, MD  aspirin 81 MG tablet Take 81 mg by mouth daily.    Yes [provider]  atorvastatin (LIPITOR) 40 MG tablet Take 1 tablet (40 mg total) daily by mouth. 10/09/17  Yes Fayrene Helper, MD  cephALEXin (KEFLEX) 500 MG capsule Take 1 capsule (500 mg total) by mouth 3 (three) times daily. 11/26/17  Yes Rolland Porter, MD  docusate sodium (COLACE) 100 MG capsule Take 100 mg by mouth daily as needed for mild constipation.   Yes [provider]  donepezil (ARICEPT) 10 MG tablet Take 10 mg by mouth daily.  04/11/17  Yes [provider]  feeding supplement, ENSURE COMPLETE, (ENSURE COMPLETE) LIQD Take 237 mLs by mouth 2 (two) times daily between meals. 01/25/17  Yes Fayrene Helper, MD  losartan (COZAAR) 100 MG tablet Take 1 tablet (100 mg total) daily by mouth. 10/09/17  Yes Fayrene Helper, MD  memantine (NAMENDA) 5 MG tablet Take 5 mg by mouth 2 (two) times daily.   Yes [provider]  Multiple Vitamin (MULTIVITAMIN WITH MINERALS) TABS tablet Take 1 tablet by mouth daily.   Yes [provider]  Vitamin D, Ergocalciferol, (DRISDOL) 50000  units CAPS capsule TAKE (1) CAPSULE BY MOUTH ONCE EVERY 7 DAYS. 11/20/17  Yes Fayrene Helper, MD  zolpidem (AMBIEN) 5 MG tablet Take 1 tablet by mouth at bedtime.  02/28/17  Yes [provider]  acetaminophen (TYLENOL) 500 MG tablet One tablet twice daily as needed for knee pain Patient taking differently: Take 500 mg by mouth every 8 (eight) hours as needed for mild pain or moderate pain.  05/08/12   Fayrene Helper, MD  amLODipine (NORVASC) 5 MG tablet Take 1 tablet (5 mg total) by mouth daily. 12/09/17   Orson Eva, MD  Incontinence Supply Disposable (DISPOSABLE BRIEF LARGE) MISC Use as needed 03/17/17   Fayrene Helper, MD  Incontinence Supply Disposable [employer]) MISC Use as needed 03/17/17   Fayrene Helper,  MD  UNABLE TO FIND Bed alarm dx- R29.6 01/25/17   Fayrene Helper, MD  UNABLE TO FIND Prevail Pads [id number] 11" 6 bags/96 pads per month Dx: urinary incontinence 06/29/17   Fayrene Helper, MD    Scheduled Meds: . amLODipine  5 mg Oral Daily  . aspirin EC  81 mg Oral Daily  . atorvastatin  40 mg Oral Daily  . donepezil  10 mg Oral Daily  . feeding supplement (ENSURE ENLIVE)  237 mL Oral BID BM  . losartan  100 mg Oral Daily  . memantine  5 mg Oral BID  . sodium chloride flush  3 mL Intravenous Q12H  . tuberculin  5 Units Intradermal Once  . zolpidem  5 mg Oral QHS   Continuous Infusions: PRN Meds:.acetaminophen, docusate sodium     Results for orders placed or performed during the hospital encounter of 12/06/17 (from the past 48 hour(s))  CBC with Differential/Platelet     Status: Abnormal   Collection Time: 12/06/17  7:13 PM  Result Value Ref Range   WBC 8.3 4.0 - 10.5 K/uL   RBC 3.97 3.87 - 5.11 MIL/uL   Hemoglobin 10.3 (L) 12.0 - 15.0 g/dL   HCT 32.9 (L) 36.0 - 46.0 %   MCV 82.9 78.0 - 100.0 fL   MCH 25.9 (L) 26.0 - 34.0 pg   MCHC 31.3 30.0 - 36.0 g/dL   RDW 14.7 11.5 - 15.5 %   Platelets 328 150 - 400 K/uL   Neutrophils Relative % 59 %     Neutro Abs 4.9 1.7 - 7.7 K/uL   Lymphocytes Relative 25 %   Lymphs Abs 2.1 0.7 - 4.0 K/uL   Monocytes Relative 9 %   Monocytes Absolute 0.8 0.1 - 1.0 K/uL   Eosinophils Relative 6 %   Eosinophils Absolute 0.5 0.0 - 0.7 K/uL   Basophils Relative 1 %   Basophils Absolute 0.1 0.0 - 0.1 K/uL  Comprehensive metabolic panel     Status: Abnormal   Collection Time: 12/06/17  7:13 PM  Result Value Ref Range   Sodium 138 135 - 145 mmol/L   Potassium 3.9 3.5 - 5.1 mmol/L   Chloride 101 101 - 111 mmol/L   CO2 27 22 - 32 mmol/L   Glucose, Bld 111 (H) 65 - 99 mg/dL   BUN 20 6 - 20 mg/dL   Creatinine, Ser 1.33 (H) 0.44 - 1.00 mg/dL   Calcium 9.3 8.9 - 10.3 mg/dL   Total Protein 7.4 6.5 - 8.1 g/dL   Albumin 3.6 3.5 - 5.0 g/dL   AST 37 15 - 41 U/L   ALT 29 14 - 54 U/L   Alkaline Phosphatase 69 38 - 126 U/L   Total Bilirubin 0.3 0.3 - 1.2 mg/dL   GFR calc non Af Amer 36 (L) >60 mL/min   GFR calc Af Amer 42 (L) >60 mL/min    Comment: (NOTE) The eGFR has been calculated using the CKD EPI equation. This calculation has not been validated in all clinical situations. eGFR's persistently <60 mL/min signify possible Chronic Kidney Disease.    Anion gap 10 5 - 15  Troponin I     Status: None   Collection Time: 12/06/17  7:13 PM  Result Value Ref Range   Troponin I <0.03 <0.03 ng/mL  MRSA PCR Screening     Status: None   Collection Time: 12/07/17  2:10 AM  Result Value Ref Range   MRSA by PCR NEGATIVE NEGATIVE  Comment:        The GeneXpert MRSA Assay (FDA approved for NASAL specimens only), is one component of a comprehensive MRSA colonization surveillance program. It is not intended to diagnose MRSA infection nor to guide or monitor treatment for MRSA infections.   CBC WITH DIFFERENTIAL     Status: Abnormal   Collection Time: 12/07/17  4:20 AM  Result Value Ref Range   WBC 8.0 4.0 - 10.5 K/uL   RBC 3.59 (L) 3.87 - 5.11 MIL/uL   Hemoglobin 9.3 (L) 12.0 - 15.0 g/dL   HCT 29.8  (L) 36.0 - 46.0 %   MCV 83.0 78.0 - 100.0 fL   MCH 25.9 (L) 26.0 - 34.0 pg   MCHC 31.2 30.0 - 36.0 g/dL   RDW 14.9 11.5 - 15.5 %   Platelets 310 150 - 400 K/uL   Neutrophils Relative % 56 %   Neutro Abs 4.5 1.7 - 7.7 K/uL   Lymphocytes Relative 27 %   Lymphs Abs 2.2 0.7 - 4.0 K/uL   Monocytes Relative 9 %   Monocytes Absolute 0.7 0.1 - 1.0 K/uL   Eosinophils Relative 7 %   Eosinophils Absolute 0.6 0.0 - 0.7 K/uL   Basophils Relative 1 %   Basophils Absolute 0.0 0.0 - 0.1 K/uL  Basic metabolic panel     Status: Abnormal   Collection Time: 12/07/17  4:20 AM  Result Value Ref Range   Sodium 138 135 - 145 mmol/L   Potassium 4.0 3.5 - 5.1 mmol/L   Chloride 105 101 - 111 mmol/L   CO2 25 22 - 32 mmol/L   Glucose, Bld 88 65 - 99 mg/dL   BUN 17 6 - 20 mg/dL   Creatinine, Ser 1.10 (H) 0.44 - 1.00 mg/dL   Calcium 9.0 8.9 - 10.3 mg/dL   GFR calc non Af Amer 45 (L) >60 mL/min   GFR calc Af Amer 52 (L) >60 mL/min    Comment: (NOTE) The eGFR has been calculated using the CKD EPI equation. This calculation has not been validated in all clinical situations. eGFR's persistently <60 mL/min signify possible Chronic Kidney Disease.    Anion gap 8 5 - 15  Vitamin B12     Status: None   Collection Time: 12/07/17  8:54 AM  Result Value Ref Range   Vitamin B-12 450 180 - 914 pg/mL    Comment: (NOTE) This assay is not validated for testing neonatal or myeloproliferative syndrome specimens for Vitamin B12 levels. Performed at [facility], [place] [address], [place], [place] [address]   TSH     Status: None   Collection Time: 12/07/17  8:54 AM  Result Value Ref Range   TSH 1.334 0.350 - 4.500 uIU/mL    Comment: Performed by a 3rd Generation assay with a functional sensitivity of <=0.01 uIU/mL.  T4, free     Status: None   Collection Time: 12/07/17  8:54 AM  Result Value Ref Range   Free T4 1.00 0.61 - 1.12 ng/dL    Comment: (NOTE) Biotin ingestion may interfere with free T4 tests. If  the results are inconsistent with the TSH level, previous test results, or the clinical presentation, then consider biotin interference. If needed, order repeat testing after stopping biotin. Performed at [facility], [place] [address] [place], [place] [address]   Urinalysis, Complete w Microscopic     Status: Abnormal   Collection Time: 12/07/17  4:45 PM  Result Value Ref Range  Color, Urine YELLOW YELLOW   APPearance HAZY (A) CLEAR   Specific Gravity, Urine 1.017 1.005 - 1.030   pH 6.0 5.0 - 8.0   Glucose, UA NEGATIVE NEGATIVE mg/dL   Hgb urine dipstick SMALL (A) NEGATIVE   Bilirubin Urine NEGATIVE NEGATIVE   Ketones, ur NEGATIVE NEGATIVE mg/dL   Protein, ur NEGATIVE NEGATIVE mg/dL   Nitrite NEGATIVE NEGATIVE   Leukocytes, UA LARGE (A) NEGATIVE   RBC / HPF 6-30 0 - 5 RBC/hpf   WBC, UA TOO NUMEROUS TO COUNT 0 - 5 WBC/hpf   Bacteria, UA RARE (A) NONE SEEN   Squamous Epithelial / LPF 0-5 (A) NONE SEEN   WBC Clumps PRESENT     Studies/Results:   R foot  FINDINGS: Circumferential soft tissue swelling. No fracture or dislocation. No sizable ankle effusion.  IMPRESSION: Circumferential soft tissue swelling at the right ankle without acute osseous abnormality    Adamae Ricklefs A. Merlene Laughter, M.D.  Diplomate, Tax adviser of Psychiatry and Neurology ( Neurology). 12/08/2017, 5:59 PM

## 2017-12-08 NOTE — NC FL2 (Addendum)
[place] LEVEL OF CARE SCREENING TOOL     IDENTIFICATION  Patient Name: Emma Dawson Birthdate: Feb 02, 1932 Sex: female Admission Date (Current Location): 12/06/2017  [place] and [place] Number:  [place] [facility] [id number] [place] and Address:  [place] [address], [place]      Provider Number: [id number]  Attending Physician Name and Address:  Orson Eva, MD  Relative Name and Phone Number:  Joaquin Bend [phone]    Current Level of Care: Hospital Recommended Level of Care: [place] Prior Approval Number:    Date Approved/Denied:   PASRR Number: [id number]  Discharge Plan:      Current Diagnoses: Patient Active Problem List   Diagnosis Date Noted  . CKD (chronic kidney disease) stage 3, GFR 30-59 ml/min (HCC) 12/07/2017  . Generalized weakness 12/07/2017  . Edema of right lower extremity 12/06/2017  . Alzheimer's dementia 12/06/2017  . Incontinence in female 01/26/2017  . Knee pain, bilateral 12/16/2016  . Need for assistance due to unsteady gait 12/16/2016  . Recurrent falls while walking 12/16/2016  . Memory loss 06/02/2015  . Personal history of noncompliance with medical treatment, presenting hazards to health 09/27/2013  . Genital herpes 05/02/2008  . CAROTID ARTERY STENOSIS, LEFT 05/02/2008  . SYNCOPE, VASOVAGAL 05/02/2008  . CARDIAC MURMUR 05/02/2008  . Anemia 10/03/2007  . Hyperlipidemia LDL goal <100 09/27/2006  . Essential hypertension 09/27/2006  . Allergic rhinitis 09/27/2006  . Osteoarthritis 09/27/2006  . Invasive ductal carcinoma of breast ([place]) 09/27/2006    Orientation RESPIRATION BLADDER Height & Weight     Self, Place  Normal Continent Weight: 146 lb 6.2 oz (66.4 kg) Height:  5\' 2"  (157.5 cm)  BEHAVIORAL SYMPTOMS/MOOD NEUROLOGICAL BOWEL NUTRITION STATUS      Continent Diet(heart healthy)  AMBULATORY STATUS COMMUNICATION OF NEEDS Skin   Extensive Assist Verbally Normal                        Personal Care Assistance Level of Assistance  Bathing, Feeding, Dressing Bathing Assistance: Limited assistance Feeding assistance: Independent Dressing Assistance: Limited assistance     Functional Limitations Info  Sight, Hearing, Speech Sight Info: Adequate Hearing Info: Adequate Speech Info: Adequate    SPECIAL CARE FACTORS FREQUENCY                       Contractures Contractures Info: Not present    Additional Factors Info  Code Status, Allergies Code Status Info: full Allergies Info: aricept           TAKE these medications   acetaminophen 500 MG tablet Commonly known as:  TYLENOL One tablet twice daily as needed for knee pain What changed:    how much to take  how to take this  when to take this  reasons to take this  additional instructions   amLODipine 5 MG tablet Commonly known as:  NORVASC Take 1 tablet (5 mg total) by mouth daily. What changed:    medication strength  how much to take   aspirin 81 MG tablet Take 81 mg by mouth daily.   atorvastatin 40 MG tablet Commonly known as:  LIPITOR Take 1 tablet (40 mg total) by mouth daily.   cefdinir 300 MG capsule Commonly known as:  OMNICEF Take 1 capsule (300 mg total) by mouth 2 (two) times daily.   docusate sodium 100 MG capsule Commonly known as:  COLACE Take 100 mg by  mouth daily as needed for mild constipation.   donepezil 10 MG tablet Commonly known as:  ARICEPT Take 1 tablet (10 mg total) by mouth daily.   feeding supplement (ENSURE COMPLETE) Liqd Take 237 mLs by mouth 2 (two) times daily between meals.   losartan 100 MG tablet Commonly known as:  COZAAR Take 1 tablet (100 mg total) by mouth daily.   memantine 5 MG tablet Commonly known as:  NAMENDA Take 1 tablet (5 mg total) by mouth 2 (two) times daily.   multivitamin with minerals Tabs tablet Take 1 tablet by mouth daily.   UNABLE TO FIND Prevail Pads BC013 11" 6  bags/96 pads per month Dx: urinary incontinence   Vitamin D (Ergocalciferol) 50000 units Caps capsule Commonly known as:  DRISDOL Take 1 capsule (50,000 Units total) by mouth every 7 (seven) days. Start taking on:  12/14/2017 What changed:  See the new instructions.   zolpidem 5 MG tablet Commonly known as:  AMBIEN Take 1 tablet (5 mg total) by mouth at bedtime.          Relevant Imaging Results:  Relevant Lab Results:   Additional Information SSN: [id number], Clydene Pugh, LCSW

## 2017-12-08 NOTE — Progress Notes (Signed)
Spoke with Dr. Merlene Laughter regarding new patient consult and niece's request for patient to be seen by Dr. Merlene Laughter. Informed by Dr. Merlene Laughter that niece has spoken to him about concerns of not being able to manage or take care of patient at home. Dr. Merlene Laughter does not recommend patient going home and will be in to see her this evening/

## 2017-12-08 NOTE — Progress Notes (Signed)
Tuberculin, PPD injected intradermally in the Right forearm and marked with a black sharpie

## 2017-12-08 NOTE — Discharge Planning (Signed)
Physician Discharge Summary  Emma Dawson EVO:[id number] DOB: 05-26-1932 DOA: 12/06/2017  PCP: Fayrene Helper, MD  Admit date: 12/06/2017 Discharge date:12/09/2017  Admitted From: Home Disposition:  [facility]  Recommendations for Outpatient Follow-up:  1. Follow up with PCP in 1-2 weeks 2. Please obtain BMP/CBC in one week   Home Health: No Equipment/Devices: WC  Discharge Condition: Stable CODE STATUS: FULL Diet recommendation: Heart Healthy   Brief/Interim Summary: 84 year old female with a history of hypertension, hyperlipidemia, dementia, impaired glucose tolerance, and breast cancer presented with 2-3-day history of generalized weakness and difficulty ambulating secondary to right lower extremity pain and edema. The patient is a poor historian secondary to her cognitive impairment. However, she is able to answer simple questions appropriately. She denies any fevers, chills, chest pain, shortness breath, nausea, vomiting, diarrhea, abdominal pain, dysuria, hematuria. In the emergency department, the patient was afebrile hemodynamically stable saturating 100% on room air. BMP and CBC were unremarkable. X-rays of the right ankle and foot were unremarkable for any osseous abnormality. Chest x-ray was negative.  The remainder of her metabolic workup was unremarkable.  Physical therapy was ordered, and they recommended skilled nursing facility vs assistance.  Neurology was consulted to assist with management.    Discharge Diagnoses:  Generalized weakness/lower extremity weakness -PT evaluation recommended SNF -Serum B12--450 -TSH--1.334 -likely represents progression of her dementia with decrease functional ability -personally reviewed EKG-sinus, nonspecific T wave change -personally reviewed CXR--no infiltrates or edema -likely due to deconditioning--neuro exam is non-focal and without cord level -I had long discussion with pt's niece (POA)--she was very irate and  did not feel pt's home situation represented a safe discharge--she demanded neurology consult by Dr. Merlene Laughter whom I have called personally on 12/08/17 am -Niece only wants pt to go to [place];  She refuses for pt to go to any other SNF;  She also refuses d/c home as above  Right lower extremity edema -Venous duplex--negative DVT  CKD stage 3 -baseline creatinine 0.8-1.0 -serum creatinine 1.10 at time of d/c  Essential hypertension -Continue amlodipine and losartan -Decrease amlodipine to 5 mg daily  Hyperlipidemia -Continue statin  Dementia -Continue Aricept and Namenda      Discharge Instructions  Discharge Instructions    Diet - low sodium heart healthy   Complete by:  As directed    Increase activity slowly   Complete by:  As directed      Allergies as of 12/08/2017      Reactions   Aricept [donepezil Hcl]    Hair loss       Medication List    STOP taking these medications   cephALEXin 500 MG capsule Commonly known as:  KEFLEX   Disposable Brief Large Misc   UNABLE TO FIND   UNABLE TO FIND   Underpads Misc     TAKE these medications   acetaminophen 500 MG tablet Commonly known as:  TYLENOL One tablet twice daily as needed for knee pain What changed:    how much to take  how to take this  when to take this  reasons to take this  additional instructions   amLODipine 5 MG tablet Commonly known as:  NORVASC Take 1 tablet (5 mg total) by mouth daily. Start taking on:  12/09/2017 What changed:    medication strength  how much to take   aspirin 81 MG tablet Take 81 mg by mouth daily.   atorvastatin 40 MG tablet Commonly known as:  LIPITOR Take 1 tablet (  40 mg total) daily by mouth.   docusate sodium 100 MG capsule Commonly known as:  COLACE Take 100 mg by mouth daily as needed for mild constipation.   donepezil 10 MG tablet Commonly known as:  ARICEPT Take 10 mg by mouth daily.   feeding supplement (ENSURE  COMPLETE) Liqd Take 237 mLs by mouth 2 (two) times daily between meals.   losartan 100 MG tablet Commonly known as:  COZAAR Take 1 tablet (100 mg total) daily by mouth.   memantine 5 MG tablet Commonly known as:  NAMENDA Take 5 mg by mouth 2 (two) times daily.   multivitamin with minerals Tabs tablet Take 1 tablet by mouth daily.   Vitamin D (Ergocalciferol) 50000 units Caps capsule Commonly known as:  DRISDOL TAKE (1) CAPSULE BY MOUTH ONCE EVERY 7 DAYS.   zolpidem 5 MG tablet Commonly known as:  AMBIEN Take 1 tablet by mouth at bedtime.      Contact information for after-discharge care    [facility] .   Service:  Skilled Nursing Contact information: [place] [address] [phone]             Allergies  Allergen Reactions  . Aricept [Donepezil Hcl]     Hair loss     Consultations:  neurology   Procedures/Studies: Dg Chest 2 View  Result Date: 12/06/2017 CLINICAL DATA:  Right lower extremity swelling, intermittent pain EXAM: CHEST  2 VIEW COMPARISON:  01/23/2017 FINDINGS: There is a left-sided Port-A-Cath with the tip projecting over the SVC. There is no focal parenchymal opacity. There is no pleural effusion or pneumothorax. There is stable cardiomegaly. The osseous structures are unremarkable. IMPRESSION: No active cardiopulmonary disease. Electronically Signed   By: Kathreen Devoid   On: 12/06/2017 21:01   Dg Pelvis 1-2 Views  Result Date: 12/06/2017 CLINICAL DATA:  Pain.  No reported injury. EXAM: PELVIS - 1-2 VIEW COMPARISON:  05/02/2005 PET-CT. FINDINGS: No pelvic fracture or diastasis. No evidence of hip dislocation on this single frontal view. Mild osteoarthritis in the weight-bearing portions of both hip joints. No suspicious focal osseous lesions. Degenerative changes in the visualized lower lumbar spine. IMPRESSION: No fracture. Electronically Signed   By: Ilona Sorrel M.D.   On:  12/06/2017 20:57   Dg Ankle Complete Right  Result Date: 12/06/2017 CLINICAL DATA:  Right lower extremity pain and swelling EXAM: RIGHT ANKLE - COMPLETE 3+ VIEW COMPARISON:  None. FINDINGS: Circumferential soft tissue swelling. No fracture or dislocation. No sizable ankle effusion. IMPRESSION: Circumferential soft tissue swelling at the right ankle without acute osseous abnormality. Electronically Signed   By: Ulyses Jarred M.D.   On: 12/06/2017 20:55   US Venous Img Lower Unilateral Right  Result Date: 12/07/2017 CLINICAL DATA:  Intermittent right lower extremity pain and edema for the past week. EXAM: RIGHT LOWER EXTREMITY VENOUS DOPPLER ULTRASOUND TECHNIQUE: Gray-scale sonography with graded compression, as well as color Doppler and duplex ultrasound were performed to evaluate the lower extremity deep venous systems from the level of the common femoral vein and including the common femoral, femoral, profunda femoral, popliteal and calf veins including the posterior tibial, peroneal and gastrocnemius veins when visible. The superficial great saphenous vein was also interrogated. Spectral Doppler was utilized to evaluate flow at rest and with distal augmentation maneuvers in the common femoral, femoral and popliteal veins. COMPARISON:  None. FINDINGS: Contralateral Common Femoral Vein: Respiratory phasicity is normal and symmetric with the  symptomatic side. No evidence of thrombus. Normal compressibility. Common Femoral Vein: No evidence of thrombus. Normal compressibility, respiratory phasicity and response to augmentation. Saphenofemoral Junction: No evidence of thrombus. Normal compressibility and flow on color Doppler imaging. Profunda Femoral Vein: No evidence of thrombus. Normal compressibility and flow on color Doppler imaging. Femoral Vein: No evidence of thrombus. Normal compressibility, respiratory phasicity and response to augmentation. Popliteal Vein: No evidence of thrombus. Normal  compressibility, respiratory phasicity and response to augmentation. Calf Veins: No evidence of thrombus. Normal compressibility and flow on color Doppler imaging. Superficial Great Saphenous Vein: No evidence of thrombus. Normal compressibility. Venous Reflux:  None. Other Findings:  None. IMPRESSION: No evidence of DVT within the right lower extremity. Electronically Signed   By: Sandi Mariscal M.D.   On: 12/07/2017 11:48   Dg Foot Complete Right  Result Date: 12/06/2017 CLINICAL DATA:  Right lower extremity pain and swelling EXAM: RIGHT FOOT COMPLETE - 3+ VIEW COMPARISON:  None. FINDINGS: Severe osteopenia. No acute fracture or dislocation. Joint spaces are relatively well maintained. Severe soft tissue swelling along the dorsal aspect of the foot. IMPRESSION: 1.  No acute osseous injury of the right foot. 2. Soft tissue swelling along the dorsal aspect of the foot. Electronically Signed   By: Kathreen Devoid   On: 12/06/2017 20:55        Discharge Exam: Vitals:   12/08/17 0917 12/08/17 1245  BP: 133/64 (!) 137/48  Pulse:  65  Resp:  20  Temp:  98.2 F (36.8 C)  SpO2:  100%   Vitals:   12/08/17 0324 12/08/17 0850 12/08/17 0917 12/08/17 1245  BP: (!) 137/57 (!) 133/55 133/64 (!) 137/48  Pulse: 63 67  65  Resp: 20 20  20   Temp: 98 F (36.7 C) 98 F (36.7 C)  98.2 F (36.8 C)  TempSrc: Oral Oral  Oral  SpO2: 100% 100%  100%  Weight: 66.4 kg (146 lb 6.2 oz)     Height:        General: Pt is alert, awake, not in acute distress Cardiovascular: RRR, S1/S2 +, no rubs, no gallops Respiratory: CTA bilaterally, no wheezing, no rhonchi Abdominal: Soft, NT, ND, bowel sounds + Extremities: no edema, no cyanosis   The results of significant diagnostics from this hospitalization (including imaging, microbiology, ancillary and laboratory) are listed below for reference.    Significant Diagnostic Studies: Dg Chest 2 View  Result Date: 12/06/2017 CLINICAL DATA:  Right lower extremity  swelling, intermittent pain EXAM: CHEST  2 VIEW COMPARISON:  01/23/2017 FINDINGS: There is a left-sided Port-A-Cath with the tip projecting over the SVC. There is no focal parenchymal opacity. There is no pleural effusion or pneumothorax. There is stable cardiomegaly. The osseous structures are unremarkable. IMPRESSION: No active cardiopulmonary disease. Electronically Signed   By: Kathreen Devoid   On: 12/06/2017 21:01   Dg Pelvis 1-2 Views  Result Date: 12/06/2017 CLINICAL DATA:  Pain.  No reported injury. EXAM: PELVIS - 1-2 VIEW COMPARISON:  05/02/2005 PET-CT. FINDINGS: No pelvic fracture or diastasis. No evidence of hip dislocation on this single frontal view. Mild osteoarthritis in the weight-bearing portions of both hip joints. No suspicious focal osseous lesions. Degenerative changes in the visualized lower lumbar spine. IMPRESSION: No fracture. Electronically Signed   By: Ilona Sorrel M.D.   On: 12/06/2017 20:57   Dg Ankle Complete Right  Result Date: 12/06/2017 CLINICAL DATA:  Right lower extremity pain and swelling EXAM: RIGHT ANKLE - COMPLETE 3+ VIEW COMPARISON:  None. FINDINGS: Circumferential soft tissue swelling. No fracture or dislocation. No sizable ankle effusion. IMPRESSION: Circumferential soft tissue swelling at the right ankle without acute osseous abnormality. Electronically Signed   By: Ulyses Jarred M.D.   On: 12/06/2017 20:55   US Venous Img Lower Unilateral Right  Result Date: 12/07/2017 CLINICAL DATA:  Intermittent right lower extremity pain and edema for the past week. EXAM: RIGHT LOWER EXTREMITY VENOUS DOPPLER ULTRASOUND TECHNIQUE: Gray-scale sonography with graded compression, as well as color Doppler and duplex ultrasound were performed to evaluate the lower extremity deep venous systems from the level of the common femoral vein and including the common femoral, femoral, profunda femoral, popliteal and calf veins including the posterior tibial, peroneal and gastrocnemius veins  when visible. The superficial great saphenous vein was also interrogated. Spectral Doppler was utilized to evaluate flow at rest and with distal augmentation maneuvers in the common femoral, femoral and popliteal veins. COMPARISON:  None. FINDINGS: Contralateral Common Femoral Vein: Respiratory phasicity is normal and symmetric with the symptomatic side. No evidence of thrombus. Normal compressibility. Common Femoral Vein: No evidence of thrombus. Normal compressibility, respiratory phasicity and response to augmentation. Saphenofemoral Junction: No evidence of thrombus. Normal compressibility and flow on color Doppler imaging. Profunda Femoral Vein: No evidence of thrombus. Normal compressibility and flow on color Doppler imaging. Femoral Vein: No evidence of thrombus. Normal compressibility, respiratory phasicity and response to augmentation. Popliteal Vein: No evidence of thrombus. Normal compressibility, respiratory phasicity and response to augmentation. Calf Veins: No evidence of thrombus. Normal compressibility and flow on color Doppler imaging. Superficial Great Saphenous Vein: No evidence of thrombus. Normal compressibility. Venous Reflux:  None. Other Findings:  None. IMPRESSION: No evidence of DVT within the right lower extremity. Electronically Signed   By: Sandi Mariscal M.D.   On: 12/07/2017 11:48   Dg Foot Complete Right  Result Date: 12/06/2017 CLINICAL DATA:  Right lower extremity pain and swelling EXAM: RIGHT FOOT COMPLETE - 3+ VIEW COMPARISON:  None. FINDINGS: Severe osteopenia. No acute fracture or dislocation. Joint spaces are relatively well maintained. Severe soft tissue swelling along the dorsal aspect of the foot. IMPRESSION: 1.  No acute osseous injury of the right foot. 2. Soft tissue swelling along the dorsal aspect of the foot. Electronically Signed   By: Kathreen Devoid   On: 12/06/2017 20:55     Microbiology: Recent Results (from the past 240 hour(s))  MRSA PCR Screening      Status: None   Collection Time: 12/07/17  2:10 AM  Result Value Ref Range Status   MRSA by PCR NEGATIVE NEGATIVE Final    Comment:        The GeneXpert MRSA Assay (FDA approved for NASAL specimens only), is one component of a comprehensive MRSA colonization surveillance program. It is not intended to diagnose MRSA infection nor to guide or monitor treatment for MRSA infections.      Labs: Basic Metabolic Panel: Recent Labs  Lab 12/06/17 1913 12/07/17 0420  NA 138 138  K 3.9 4.0  CL 101 105  CO2 27 25  GLUCOSE 111* 88  BUN 20 17  CREATININE 1.33* 1.10*  CALCIUM 9.3 9.0   Liver Function Tests: Recent Labs  Lab 12/06/17 1913  AST 37  ALT 29  ALKPHOS 69  BILITOT 0.3  PROT 7.4  ALBUMIN 3.6   No results for input(s): LIPASE, AMYLASE in the last 168 hours. No results for input(s): AMMONIA in the last 168 hours. CBC: Recent Labs  Lab 12/06/17  1913 12/07/17 0420  WBC 8.3 8.0  NEUTROABS 4.9 4.5  HGB 10.3* 9.3*  HCT 32.9* 29.8*  MCV 82.9 83.0  PLT 328 310   Cardiac Enzymes: Recent Labs  Lab 12/06/17 1913  TROPONINI <0.03   BNP: Invalid input(s): POCBNP CBG: No results for input(s): GLUCAP in the last 168 hours.  Time coordinating discharge:  Greater than 30 minutes  Signed:  Orson Eva, DO [facility] Pager: [phone] 12/08/2017, 4:04 PM

## 2017-12-08 NOTE — Progress Notes (Addendum)
PROGRESS NOTE  Emma Dawson YHC:[id number] DOB: 11-03-1932 DOA: 12/06/2017 PCP: Fayrene Helper, MD  Brief History:  84 year old female with a history of hypertension, hyperlipidemia, dementia, impaired glucose tolerance, and breast cancer presented with 2-3-day history of generalized weakness and difficulty ambulating secondary to right lower extremity pain and edema.  The patient is a poor historian secondary to her cognitive impairment.  However, she is able to answer simple questions appropriately.  She denies any fevers, chills, chest pain, shortness breath, nausea, vomiting, diarrhea, abdominal pain, dysuria, hematuria.  In the emergency department, the patient was afebrile hemodynamically stable saturating 100% on room air.  BMP and CBC were unremarkable.  X-rays of the right ankle and foot were unremarkable for any osseous abnormality.  Chest x-ray was negative.  Assessment/Plan: Generalized weakness/:ower extremity weakness -PT evaluation recommended SNF -Serum B12--450 -TSH--1.334 -Urinalysis -personally reviewed EKG-sinus, nonspecific T wave change -personally reviewed CXR--no infiltrates or edema -likely due to deconditioning--neuro exam is non-focal -I had long discussion with pt's niece (POA)--she was very irate and did not feel pt's home situation represented a safe discharge--she demanded neurology consult by Dr. Merlene Laughter whom I have called personally on 12/08/17 am -Niece only wants pt to go to [facility];  She refuses for pt to go to any other SNF;  She also refuses d/c home as above  Right lower extremity edema -Venous duplex--negative DVT  CKD stage 3 -baseline creatinine 0.8-1.0  Essential hypertension -Continue amlodipine and losartan -Decrease amlodipine to 5 mg daily  Hyperlipidemia -Continue statin  Dementia -Continue Aricept and Namenda    Disposition Plan:  pending neurology evaluation Family Communication:  discussed  with niece on phone--Total time spent 35 minutes.  Greater than 50% spent face to face counseling and coordinating care.   Consultants:  none  Code Status:  FULL  DVT Prophylaxis:  [place] Lovenox   Procedures: As Listed in Progress Note Above  Antibiotics: None     Subjective: Patient denies fevers, chills, headache, chest pain, dyspnea, nausea, vomiting, diarrhea, abdominal pain, dysuria, hematuria, hematochezia, and melena. Patient denies any headache, neck pain, back pain.  She denies any dysesthesias.  Objective: Vitals:   12/07/17 1429 12/07/17 1957 12/07/17 2335 12/08/17 0324  BP: (!) 128/50 (!) 142/58 (!) 150/57 (!) 137/57  Pulse: 74 70 66 63  Resp: 20 18 18 20   Temp: 98 F (36.7 C) 98.7 F (37.1 C) 97.7 F (36.5 C) 98 F (36.7 C)  TempSrc: Oral Oral Oral Oral  SpO2: 99% 99% 100% 100%  Weight:    66.4 kg (146 lb 6.2 oz)  Height:        Intake/Output Summary (Last 24 hours) at 12/08/2017 5176 Last data filed at 12/08/2017 0830 Gross per 24 hour  Intake 240 ml  Output 1700 ml  Net -1460 ml   Weight change: 7.432 kg (16 lb 6.2 oz) Exam:   General:  Pt is alert, follows commands appropriately, not in acute distress  HEENT: No icterus, No thrush, No neck mass, [place]/AT  Cardiovascular: RRR, S1/S2, no rubs, no gallops  Respiratory: CTA bilaterally, no wheezing, no crackles, no rhonchi  Abdomen: Soft/+BS, non tender, non distended, no guarding  Extremities: No edema, No lymphangitis, No petechiae, No rashes, no synovitis.  Neuro:  CN II-XII intact, strength 4/5 in RUE, RLE, strength 4/5 LUE, LLE; sensation intact bilateral; no dysmetria; babinski equivocal;  No cord level on sensory exam    Data Reviewed:  I have personally reviewed following labs and imaging studies Basic Metabolic Panel: Recent Labs  Lab 12/06/17 1913 12/07/17 0420  NA 138 138  K 3.9 4.0  CL 101 105  CO2 27 25  GLUCOSE 111* 88  BUN 20 17  CREATININE 1.33* 1.10*  CALCIUM  9.3 9.0   Liver Function Tests: Recent Labs  Lab 12/06/17 1913  AST 37  ALT 29  ALKPHOS 69  BILITOT 0.3  PROT 7.4  ALBUMIN 3.6   No results for input(s): LIPASE, AMYLASE in the last 168 hours. No results for input(s): AMMONIA in the last 168 hours. Coagulation Profile: No results for input(s): INR, PROTIME in the last 168 hours. CBC: Recent Labs  Lab 12/06/17 1913 12/07/17 0420  WBC 8.3 8.0  NEUTROABS 4.9 4.5  HGB 10.3* 9.3*  HCT 32.9* 29.8*  MCV 82.9 83.0  PLT 328 310   Cardiac Enzymes: Recent Labs  Lab 12/06/17 1913  TROPONINI <0.03   BNP: Invalid input(s): POCBNP CBG: No results for input(s): GLUCAP in the last 168 hours. HbA1C: No results for input(s): HGBA1C in the last 72 hours. Urine analysis:    Component Value Date/Time   COLORURINE YELLOW 11/26/2017 0422   APPEARANCEUR CLOUDY (A) 11/26/2017 0422   LABSPEC 1.011 11/26/2017 0422   PHURINE 8.0 11/26/2017 0422   GLUCOSEU NEGATIVE 11/26/2017 0422   HGBUR NEGATIVE 11/26/2017 0422   HGBUR trace-lysed 09/03/2008 0000   BILIRUBINUR NEGATIVE 11/26/2017 0422   BILIRUBINUR neg 06/02/2015 1610   KETONESUR NEGATIVE 11/26/2017 0422   PROTEINUR 30 (A) 11/26/2017 0422   UROBILINOGEN 0.2 06/02/2015 1610   UROBILINOGEN 0.2 09/03/2008 0000   NITRITE NEGATIVE 11/26/2017 0422   LEUKOCYTESUR LARGE (A) 11/26/2017 0422   Sepsis Labs: @LABRCNTIP (procalcitonin:4,lacticidven:4) ) Recent Results (from the past 240 hour(s))  MRSA PCR Screening     Status: None   Collection Time: 12/07/17  2:10 AM  Result Value Ref Range Status   MRSA by PCR NEGATIVE NEGATIVE Final    Comment:        The GeneXpert MRSA Assay (FDA approved for NASAL specimens only), is one component of a comprehensive MRSA colonization surveillance program. It is not intended to diagnose MRSA infection nor to guide or monitor treatment for MRSA infections.      Scheduled Meds: . amLODipine  5 mg Oral Daily  . aspirin EC  81 mg Oral Daily    . atorvastatin  40 mg Oral Daily  . cephALEXin  500 mg Oral TID  . donepezil  10 mg Oral Daily  . feeding supplement (ENSURE ENLIVE)  237 mL Oral BID BM  . losartan  100 mg Oral Daily  . memantine  5 mg Oral BID  . sodium chloride flush  3 mL Intravenous Q12H  . zolpidem  5 mg Oral QHS   Continuous Infusions:  Procedures/Studies: Dg Chest 2 View  Result Date: 12/06/2017 CLINICAL DATA:  Right lower extremity swelling, intermittent pain EXAM: CHEST  2 VIEW COMPARISON:  01/23/2017 FINDINGS: There is a left-sided Port-A-Cath with the tip projecting over the SVC. There is no focal parenchymal opacity. There is no pleural effusion or pneumothorax. There is stable cardiomegaly. The osseous structures are unremarkable. IMPRESSION: No active cardiopulmonary disease. Electronically Signed   By: Kathreen Devoid   On: 12/06/2017 21:01   Dg Pelvis 1-2 Views  Result Date: 12/06/2017 CLINICAL DATA:  Pain.  No reported injury. EXAM: PELVIS - 1-2 VIEW COMPARISON:  05/02/2005 PET-CT. FINDINGS: No pelvic fracture or diastasis. No evidence  of hip dislocation on this single frontal view. Mild osteoarthritis in the weight-bearing portions of both hip joints. No suspicious focal osseous lesions. Degenerative changes in the visualized lower lumbar spine. IMPRESSION: No fracture. Electronically Signed   By: Ilona Sorrel M.D.   On: 12/06/2017 20:57   Dg Ankle Complete Right  Result Date: 12/06/2017 CLINICAL DATA:  Right lower extremity pain and swelling EXAM: RIGHT ANKLE - COMPLETE 3+ VIEW COMPARISON:  None. FINDINGS: Circumferential soft tissue swelling. No fracture or dislocation. No sizable ankle effusion. IMPRESSION: Circumferential soft tissue swelling at the right ankle without acute osseous abnormality. Electronically Signed   By: Ulyses Jarred M.D.   On: 12/06/2017 20:55   US Venous Img Lower Unilateral Right  Result Date: 12/07/2017 CLINICAL DATA:  Intermittent right lower extremity pain and edema for the  past week. EXAM: RIGHT LOWER EXTREMITY VENOUS DOPPLER ULTRASOUND TECHNIQUE: Gray-scale sonography with graded compression, as well as color Doppler and duplex ultrasound were performed to evaluate the lower extremity deep venous systems from the level of the common femoral vein and including the common femoral, femoral, profunda femoral, popliteal and calf veins including the posterior tibial, peroneal and gastrocnemius veins when visible. The superficial great saphenous vein was also interrogated. Spectral Doppler was utilized to evaluate flow at rest and with distal augmentation maneuvers in the common femoral, femoral and popliteal veins. COMPARISON:  None. FINDINGS: Contralateral Common Femoral Vein: Respiratory phasicity is normal and symmetric with the symptomatic side. No evidence of thrombus. Normal compressibility. Common Femoral Vein: No evidence of thrombus. Normal compressibility, respiratory phasicity and response to augmentation. Saphenofemoral Junction: No evidence of thrombus. Normal compressibility and flow on color Doppler imaging. Profunda Femoral Vein: No evidence of thrombus. Normal compressibility and flow on color Doppler imaging. Femoral Vein: No evidence of thrombus. Normal compressibility, respiratory phasicity and response to augmentation. Popliteal Vein: No evidence of thrombus. Normal compressibility, respiratory phasicity and response to augmentation. Calf Veins: No evidence of thrombus. Normal compressibility and flow on color Doppler imaging. Superficial Great Saphenous Vein: No evidence of thrombus. Normal compressibility. Venous Reflux:  None. Other Findings:  None. IMPRESSION: No evidence of DVT within the right lower extremity. Electronically Signed   By: Sandi Mariscal M.D.   On: 12/07/2017 11:48   Dg Foot Complete Right  Result Date: 12/06/2017 CLINICAL DATA:  Right lower extremity pain and swelling EXAM: RIGHT FOOT COMPLETE - 3+ VIEW COMPARISON:  None. FINDINGS: Severe  osteopenia. No acute fracture or dislocation. Joint spaces are relatively well maintained. Severe soft tissue swelling along the dorsal aspect of the foot. IMPRESSION: 1.  No acute osseous injury of the right foot. 2. Soft tissue swelling along the dorsal aspect of the foot. Electronically Signed   By: Kathreen Devoid   On: 12/06/2017 20:55    Orson Eva, DO  [facility] Pager [phone]  If 7PM-7AM, please contact night-coverage [email or web address] Password [email or web address] 12/08/2017, 9:22 AM   LOS: 0 days

## 2017-12-08 NOTE — Clinical Social Work Note (Signed)
Clinical Social Work Assessment  Patient Details  Name: ANWITHA Dawson MRN: [id number] Date of Birth: Dec 15, 1931  Date of referral:  12/08/17               Reason for consult:  Facility Placement                Permission sought to share information with:    Permission granted to share information::     Name::        Agency::     Relationship::     Contact Information:     Housing/Transportation Living arrangements for the past 2 months:  Single Family Home Source of Information:  Other (Comment Required)(niece) Patient Interpreter Needed:  None Criminal Activity/Legal Involvement Pertinent to Current Situation/Hospitalization:  No - Comment as needed Significant Relationships:  Other Family Members Lives with:  Self, Relatives Do you feel safe going back to the place where you live?  No Need for family participation in patient care:  Yes (Comment)  Care giving concerns: Pt's niece states that the family members who have been taking care of pt at home can no longer manage her care.   Social Worker assessment / plan: Pt is an 84 year old female admitted from home after PCP office sent pt to the hospital for placement. Pt is observation status. She has Medicare and Medicaid. Pt will not have Medicare coverage for SNF. Spoke with pt's niece, Emma Dawson, by phone to assess. Per Emma Dawson, she is not wanting NH for pt. She wants ALF but not a dementia unit. Emma Dawson states she is a Marine scientist and therapist and she has been to many facilities and the only one she is interested in is [facility] in [place]. Emma Dawson states she has spoken with Aram Beecham at [facility] prior to this recent decline in pt's condition and that they were willing to take pt previously. Emma Dawson wants to speak with Aram Beecham and see if they can still manage pt's care. Emma Dawson has a call out to Trudy's personal cell phone and is awaiting a return call. LCSW did also call [facility] and was told that Aram Beecham is out until next Tuesday. Emma Dawson states  that she is sure Aram Beecham will call her back.  Emma Dawson also states that she called the PCP's office and he is going to come over to see pt after his clinic hours today. Discussed making a backup SNF plan with pt's niece, Emma Dawson, and she would not provide permission for LCSW to refer to any SNF's.  I explained to [place] that if Trud[facility] cannot accept pt at the ALF, she will need to give [place] SNF choice or else take pt home. Emma Dawson stated that they will not take pt home.  Will await return call from pt's niece. Will follow up soon if no return call is received.  Employment status:  Retired Forensic scientist:  Information systems manager, Medicaid In [place] PT Recommendations:  [place] / Referral to community resources:     Patient/Family's Response to care: Pt's niece is accepting of SW involvement but she is severely limiting what she will allow LCSW to do.  Patient/Family's Understanding of and Emotional Response to Diagnosis, Current Treatment, and Prognosis: Pt's niece appears to have a decent understanding of pt's condition.  Emotional Assessment Appearance:  Appears stated age Attitude/Demeanor/Rapport:    Affect (typically observed):  Quiet Orientation:  Oriented to Self Alcohol / Substance use:  Not Applicable Psych involvement (Current and /or in the community):  No (  Comment)  Discharge Needs  Concerns to be addressed:  Discharge Planning Concerns Readmission within the last 30 days:  No Current discharge risk:  Cognitively Impaired, Physical Impairment Barriers to Discharge:  Family Issues   Shade Flood, LCSW 12/08/2017, 10:52 AM

## 2017-12-09 DIAGNOSIS — I10 Essential (primary) hypertension: Secondary | ICD-10-CM | POA: Diagnosis not present

## 2017-12-09 DIAGNOSIS — F0280 Dementia in other diseases classified elsewhere without behavioral disturbance: Secondary | ICD-10-CM | POA: Diagnosis not present

## 2017-12-09 DIAGNOSIS — R600 Localized edema: Secondary | ICD-10-CM | POA: Diagnosis not present

## 2017-12-09 DIAGNOSIS — G308 Other Alzheimer's disease: Secondary | ICD-10-CM | POA: Diagnosis not present

## 2017-12-09 DIAGNOSIS — C50919 Malignant neoplasm of unspecified site of unspecified female breast: Secondary | ICD-10-CM | POA: Diagnosis not present

## 2017-12-09 DIAGNOSIS — N183 Chronic kidney disease, stage 3 (moderate): Secondary | ICD-10-CM | POA: Diagnosis not present

## 2017-12-09 DIAGNOSIS — R531 Weakness: Secondary | ICD-10-CM | POA: Diagnosis not present

## 2017-12-09 LAB — BASIC METABOLIC PANEL
Anion gap: 7 (ref 5–15)
BUN: 15 mg/dL (ref 6–20)
CHLORIDE: 106 mmol/L (ref 101–111)
CO2: 26 mmol/L (ref 22–32)
CREATININE: 0.88 mg/dL (ref 0.44–1.00)
Calcium: 9 mg/dL (ref 8.9–10.3)
GFR calc Af Amer: >60 mL/min (ref >60)
GFR calc non Af Amer: 59 mL/min — ABNORMAL LOW (ref >60)
Glucose, Bld: 88 mg/dL (ref 65–99)
POTASSIUM: 3.8 mmol/L (ref 3.5–5.1)
Sodium: 139 mmol/L (ref 135–145)

## 2017-12-09 LAB — CK: Total CK: 94 U/L (ref 38–234)

## 2017-12-09 MED ORDER — LOSARTAN POTASSIUM 100 MG PO TABS: 100.0000 mg | ORAL_TABLET | Freq: Every day | ORAL | 0 refills | Status: DC

## 2017-12-09 MED ORDER — MEMANTINE HCL 5 MG PO TABS: 5.0000 mg | ORAL_TABLET | Freq: Two times a day (BID) | ORAL | 0 refills | Status: DC

## 2017-12-09 MED ORDER — ATORVASTATIN CALCIUM 40 MG PO TABS: 40.0000 mg | ORAL_TABLET | Freq: Every day | ORAL | 0 refills | Status: AC

## 2017-12-09 MED ORDER — DONEPEZIL HCL 10 MG PO TABS: 10.0000 mg | ORAL_TABLET | Freq: Every day | ORAL | 0 refills | Status: AC

## 2017-12-09 MED ORDER — ZOLPIDEM TARTRATE 5 MG PO TABS: 5.0000 mg | ORAL_TABLET | Freq: Every day | ORAL | 0 refills | Status: DC

## 2017-12-09 MED ORDER — AMLODIPINE BESYLATE 5 MG PO TABS: 5.0000 mg | ORAL_TABLET | Freq: Every day | ORAL | 0 refills | Status: DC

## 2017-12-09 MED ORDER — DEXTROSE 5 % IV SOLN
1.0000 g | INTRAVENOUS | Status: DC
  Administered 2017-12-09: 1 g via INTRAVENOUS
  Filled 2017-12-09 (×3): qty 10

## 2017-12-09 MED ORDER — VITAMIN D (ERGOCALCIFEROL) 1.25 MG (50000 UNIT) PO CAPS: 50000.0000 [IU] | ORAL_CAPSULE | ORAL | 0 refills | Status: AC

## 2017-12-09 MED ORDER — CEFDINIR 300 MG PO CAPS: 300.0000 mg | ORAL_CAPSULE | Freq: Two times a day (BID) | ORAL | 0 refills | Status: DC

## 2017-12-09 NOTE — Progress Notes (Signed)
[facility] in [place] contacted. Spoke with Maudie Mercury MT, patient is able to come back to the facility. Medications called [facility] in [place] by Dr. Carles Collet.  FL2 signed and Faxed by Education officer, museum. TB test attached to AVS and printed to send with patient. Patient order for a wheelchair, DME put in per Dr. Carles Collet.

## 2017-12-09 NOTE — Progress Notes (Addendum)
Report called given to [place]  reports need medications called into [facility], FL2 signed, Copy of TB test.

## 2017-12-09 NOTE — Progress Notes (Signed)
Patient will Discharge To: [facility] Anticipated DC Date:12/09/17 Family Notified: Toya Smothers [phone] Transport By: [facility]   Per MD patient ready for DC to  [facility]. RN, patient, patient's family, and facility notified of DC. Assessment, Fl2/Pasrr, and Discharge Summary sent to facility. RN given number for report  [phone]). DC packet on chart. Ambulance transport requested for patient.   CSW signing off.  Reed Breech LCSWA [phone]

## 2017-12-10 LAB — URINE CULTURE

## 2017-12-14 DIAGNOSIS — Z9181 History of falling: Secondary | ICD-10-CM | POA: Diagnosis not present

## 2017-12-14 DIAGNOSIS — I10 Essential (primary) hypertension: Secondary | ICD-10-CM | POA: Diagnosis not present

## 2017-12-14 DIAGNOSIS — F0390 Unspecified dementia without behavioral disturbance: Secondary | ICD-10-CM | POA: Diagnosis not present

## 2017-12-14 DIAGNOSIS — N183 Chronic kidney disease, stage 3 (moderate): Secondary | ICD-10-CM | POA: Diagnosis not present

## 2017-12-15 DIAGNOSIS — B009 Herpesviral infection, unspecified: Secondary | ICD-10-CM | POA: Diagnosis not present

## 2017-12-15 DIAGNOSIS — N183 Chronic kidney disease, stage 3 (moderate): Secondary | ICD-10-CM | POA: Diagnosis not present

## 2017-12-15 DIAGNOSIS — G4700 Insomnia, unspecified: Secondary | ICD-10-CM | POA: Diagnosis not present

## 2017-12-15 DIAGNOSIS — G309 Alzheimer's disease, unspecified: Secondary | ICD-10-CM | POA: Diagnosis not present

## 2017-12-16 DIAGNOSIS — F0280 Dementia in other diseases classified elsewhere without behavioral disturbance: Secondary | ICD-10-CM | POA: Diagnosis not present

## 2017-12-16 DIAGNOSIS — N183 Chronic kidney disease, stage 3 (moderate): Secondary | ICD-10-CM | POA: Diagnosis not present

## 2017-12-16 DIAGNOSIS — Z9181 History of falling: Secondary | ICD-10-CM | POA: Diagnosis not present

## 2017-12-16 DIAGNOSIS — R296 Repeated falls: Secondary | ICD-10-CM | POA: Diagnosis not present

## 2017-12-16 DIAGNOSIS — I129 Hypertensive chronic kidney disease with stage 1 through stage 4 chronic kidney disease, or unspecified chronic kidney disease: Secondary | ICD-10-CM | POA: Diagnosis not present

## 2017-12-16 DIAGNOSIS — R2689 Other abnormalities of gait and mobility: Secondary | ICD-10-CM | POA: Diagnosis not present

## 2017-12-16 DIAGNOSIS — G309 Alzheimer's disease, unspecified: Secondary | ICD-10-CM | POA: Diagnosis not present

## 2017-12-18 ENCOUNTER — Other Ambulatory Visit: Payer: Self-pay | Admitting: Family Medicine

## 2017-12-19 DIAGNOSIS — G309 Alzheimer's disease, unspecified: Secondary | ICD-10-CM | POA: Diagnosis not present

## 2017-12-19 DIAGNOSIS — I129 Hypertensive chronic kidney disease with stage 1 through stage 4 chronic kidney disease, or unspecified chronic kidney disease: Secondary | ICD-10-CM | POA: Diagnosis not present

## 2017-12-19 DIAGNOSIS — R296 Repeated falls: Secondary | ICD-10-CM | POA: Diagnosis not present

## 2017-12-19 DIAGNOSIS — F0280 Dementia in other diseases classified elsewhere without behavioral disturbance: Secondary | ICD-10-CM | POA: Diagnosis not present

## 2017-12-19 DIAGNOSIS — R2689 Other abnormalities of gait and mobility: Secondary | ICD-10-CM | POA: Diagnosis not present

## 2017-12-19 DIAGNOSIS — N183 Chronic kidney disease, stage 3 (moderate): Secondary | ICD-10-CM | POA: Diagnosis not present

## 2017-12-21 ENCOUNTER — Inpatient Hospital Stay (HOSPITAL_COMMUNITY): Payer: Medicare Other | Attending: Internal Medicine

## 2017-12-21 ENCOUNTER — Encounter (HOSPITAL_COMMUNITY): Payer: Self-pay

## 2017-12-21 ENCOUNTER — Other Ambulatory Visit: Payer: Self-pay

## 2017-12-21 DIAGNOSIS — Z853 Personal history of malignant neoplasm of breast: Secondary | ICD-10-CM | POA: Insufficient documentation

## 2017-12-21 DIAGNOSIS — F0280 Dementia in other diseases classified elsewhere without behavioral disturbance: Secondary | ICD-10-CM | POA: Diagnosis not present

## 2017-12-21 DIAGNOSIS — I129 Hypertensive chronic kidney disease with stage 1 through stage 4 chronic kidney disease, or unspecified chronic kidney disease: Secondary | ICD-10-CM | POA: Diagnosis not present

## 2017-12-21 DIAGNOSIS — G309 Alzheimer's disease, unspecified: Secondary | ICD-10-CM | POA: Diagnosis not present

## 2017-12-21 DIAGNOSIS — N183 Chronic kidney disease, stage 3 (moderate): Secondary | ICD-10-CM | POA: Diagnosis not present

## 2017-12-21 DIAGNOSIS — Z452 Encounter for adjustment and management of vascular access device: Secondary | ICD-10-CM | POA: Diagnosis not present

## 2017-12-21 DIAGNOSIS — R296 Repeated falls: Secondary | ICD-10-CM | POA: Diagnosis not present

## 2017-12-21 DIAGNOSIS — R2689 Other abnormalities of gait and mobility: Secondary | ICD-10-CM | POA: Diagnosis not present

## 2017-12-21 MED ORDER — HEPARIN SOD (PORK) LOCK FLUSH 100 UNIT/ML IV SOLN
500.0000 [IU] | Freq: Once | INTRAVENOUS | Status: AC
  Administered 2017-12-21: 500 [IU] via INTRAVENOUS

## 2017-12-21 MED ORDER — SODIUM CHLORIDE 0.9% FLUSH
10.0000 mL | INTRAVENOUS | Status: DC | PRN
  Administered 2017-12-21: 10 mL via INTRAVENOUS
  Filled 2017-12-21: qty 10

## 2017-12-21 NOTE — Progress Notes (Signed)
Emma Dawson presented for Portacath access and flush.  Proper placement of portacath confirmed by CXR.  Portacath located left chest wall accessed with  H 20 needle.  Good blood return present. Portacath flushed with 24ml NS and 500U/73ml Heparin and needle removed intact.  Procedure tolerated well and without incident.  Discharged ambulatory in c/o caregiver.

## 2017-12-22 ENCOUNTER — Telehealth: Payer: Self-pay | Admitting: Family Medicine

## 2017-12-22 NOTE — Telephone Encounter (Signed)
Pt admitted to [facility] see if her new PCP is the Doc there . If this is the case no need to do anything  If I am still her PCP , then need to stop losartan 100 mg once daily and start Benicar 40 mg once daily and will need an appointment scheduled here with me in March  Please give me feedback,  Thanks

## 2017-12-25 DIAGNOSIS — F0280 Dementia in other diseases classified elsewhere without behavioral disturbance: Secondary | ICD-10-CM | POA: Diagnosis not present

## 2017-12-25 DIAGNOSIS — R296 Repeated falls: Secondary | ICD-10-CM | POA: Diagnosis not present

## 2017-12-25 DIAGNOSIS — I129 Hypertensive chronic kidney disease with stage 1 through stage 4 chronic kidney disease, or unspecified chronic kidney disease: Secondary | ICD-10-CM | POA: Diagnosis not present

## 2017-12-25 DIAGNOSIS — G309 Alzheimer's disease, unspecified: Secondary | ICD-10-CM | POA: Diagnosis not present

## 2017-12-25 DIAGNOSIS — N183 Chronic kidney disease, stage 3 (moderate): Secondary | ICD-10-CM | POA: Diagnosis not present

## 2017-12-25 DIAGNOSIS — R2689 Other abnormalities of gait and mobility: Secondary | ICD-10-CM | POA: Diagnosis not present

## 2017-12-26 ENCOUNTER — Telehealth: Payer: Self-pay | Admitting: Family Medicine

## 2017-12-26 ENCOUNTER — Other Ambulatory Visit: Payer: Self-pay | Admitting: Family Medicine

## 2017-12-26 MED ORDER — AMLODIPINE BESYLATE 10 MG PO TABS: 10.0000 mg | ORAL_TABLET | Freq: Every day | ORAL | 3 refills | Status: DC

## 2017-12-26 NOTE — Telephone Encounter (Signed)
I had sent a message a few days back . If they have changed to the facility Doc I will not prescribe. If they have not, then her bP med needs to be changed plks let me know

## 2017-12-26 NOTE — Telephone Encounter (Signed)
Needs March appointment with me as hospital follow up. Needs to d/c losartan 100 mg  Due to medication problem , p[ls let the facility and pharmacy know and schedule appt.  I have entered the changes  Yes she may have a wheelchair ordered. ( I thought that there was a Dr there)

## 2017-12-26 NOTE — Progress Notes (Signed)
Amlodipine increased to 10 mg daily and losartan discontinued, needs March appt

## 2017-12-26 NOTE — Telephone Encounter (Signed)
Facility does not have a dr

## 2017-12-26 NOTE — Telephone Encounter (Signed)
[name]- is calling asking for a Wheelchair script be sent to [facility]. Thanks

## 2017-12-26 NOTE — Telephone Encounter (Signed)
Wants script for regular wheelchair sent to [facility]. Ok to send?

## 2017-12-27 DIAGNOSIS — G309 Alzheimer's disease, unspecified: Secondary | ICD-10-CM | POA: Diagnosis not present

## 2017-12-27 DIAGNOSIS — R296 Repeated falls: Secondary | ICD-10-CM | POA: Diagnosis not present

## 2017-12-27 DIAGNOSIS — R2689 Other abnormalities of gait and mobility: Secondary | ICD-10-CM | POA: Diagnosis not present

## 2017-12-27 DIAGNOSIS — N183 Chronic kidney disease, stage 3 (moderate): Secondary | ICD-10-CM | POA: Diagnosis not present

## 2017-12-27 DIAGNOSIS — F0280 Dementia in other diseases classified elsewhere without behavioral disturbance: Secondary | ICD-10-CM | POA: Diagnosis not present

## 2017-12-27 DIAGNOSIS — I129 Hypertensive chronic kidney disease with stage 1 through stage 4 chronic kidney disease, or unspecified chronic kidney disease: Secondary | ICD-10-CM | POA: Diagnosis not present

## 2017-12-28 ENCOUNTER — Other Ambulatory Visit: Payer: Self-pay

## 2017-12-28 MED ORDER — OLMESARTAN MEDOXOMIL 40 MG PO TABS: 40.0000 mg | ORAL_TABLET | Freq: Every day | ORAL | 3 refills | Status: DC

## 2017-12-28 NOTE — Telephone Encounter (Signed)
Facility aware.

## 2017-12-29 ENCOUNTER — Telehealth: Payer: Self-pay | Admitting: Family Medicine

## 2017-12-29 ENCOUNTER — Other Ambulatory Visit: Payer: Self-pay

## 2017-12-29 ENCOUNTER — Telehealth: Payer: Self-pay

## 2017-12-29 DIAGNOSIS — R928 Other abnormal and inconclusive findings on diagnostic imaging of breast: Secondary | ICD-10-CM

## 2017-12-29 MED ORDER — UNABLE TO FIND: 0 refills | Status: DC

## 2017-12-29 NOTE — Telephone Encounter (Signed)
Please call Kim @ [facility], wants to talk to you about Emma Dawson, There is Dr and NP that comes in house.-- Please call [phone]

## 2017-12-29 NOTE — Telephone Encounter (Signed)
[employer] [employer] received a prescription for a wheel chair but she currently has one through [employer]. It is in the 10th month rental so in 3 months she will own. Cb#: [phone] Tammy- or doris   She will contact family

## 2018-01-01 DIAGNOSIS — R296 Repeated falls: Secondary | ICD-10-CM | POA: Diagnosis not present

## 2018-01-01 DIAGNOSIS — F0280 Dementia in other diseases classified elsewhere without behavioral disturbance: Secondary | ICD-10-CM | POA: Diagnosis not present

## 2018-01-01 DIAGNOSIS — R2689 Other abnormalities of gait and mobility: Secondary | ICD-10-CM | POA: Diagnosis not present

## 2018-01-01 DIAGNOSIS — G309 Alzheimer's disease, unspecified: Secondary | ICD-10-CM | POA: Diagnosis not present

## 2018-01-01 DIAGNOSIS — I129 Hypertensive chronic kidney disease with stage 1 through stage 4 chronic kidney disease, or unspecified chronic kidney disease: Secondary | ICD-10-CM | POA: Diagnosis not present

## 2018-01-01 DIAGNOSIS — N183 Chronic kidney disease, stage 3 (moderate): Secondary | ICD-10-CM | POA: Diagnosis not present

## 2018-01-01 NOTE — Telephone Encounter (Signed)
When I spoke to someone there Friday they told me they did NOT have a dr there. Will call back to clarify the confusion

## 2018-01-01 NOTE — Telephone Encounter (Signed)
The message I received was asking for a wheelchair so if this is the case, will disregard

## 2018-01-02 DIAGNOSIS — Z9181 History of falling: Secondary | ICD-10-CM | POA: Diagnosis not present

## 2018-01-02 DIAGNOSIS — K5900 Constipation, unspecified: Secondary | ICD-10-CM | POA: Diagnosis not present

## 2018-01-02 DIAGNOSIS — N183 Chronic kidney disease, stage 3 (moderate): Secondary | ICD-10-CM | POA: Diagnosis not present

## 2018-01-02 DIAGNOSIS — I10 Essential (primary) hypertension: Secondary | ICD-10-CM | POA: Diagnosis not present

## 2018-01-02 NOTE — Telephone Encounter (Signed)
Kim confirmed there IS a NP at the practice now and he will handle her care

## 2018-01-02 NOTE — Telephone Encounter (Signed)
Noted, thanks , so I will not be addressing the change in the blood pressure medication and the wheelchair order should be from the Provider htere

## 2018-01-03 DIAGNOSIS — I129 Hypertensive chronic kidney disease with stage 1 through stage 4 chronic kidney disease, or unspecified chronic kidney disease: Secondary | ICD-10-CM | POA: Diagnosis not present

## 2018-01-03 DIAGNOSIS — N183 Chronic kidney disease, stage 3 (moderate): Secondary | ICD-10-CM | POA: Diagnosis not present

## 2018-01-03 DIAGNOSIS — R2689 Other abnormalities of gait and mobility: Secondary | ICD-10-CM | POA: Diagnosis not present

## 2018-01-03 DIAGNOSIS — G309 Alzheimer's disease, unspecified: Secondary | ICD-10-CM | POA: Diagnosis not present

## 2018-01-03 DIAGNOSIS — F0280 Dementia in other diseases classified elsewhere without behavioral disturbance: Secondary | ICD-10-CM | POA: Diagnosis not present

## 2018-01-03 DIAGNOSIS — R296 Repeated falls: Secondary | ICD-10-CM | POA: Diagnosis not present

## 2018-01-03 NOTE — Telephone Encounter (Signed)
Dr available at [facility] who will take over care

## 2018-01-03 NOTE — Telephone Encounter (Signed)
Noted  

## 2018-01-08 DIAGNOSIS — N183 Chronic kidney disease, stage 3 (moderate): Secondary | ICD-10-CM | POA: Diagnosis not present

## 2018-01-08 DIAGNOSIS — R2689 Other abnormalities of gait and mobility: Secondary | ICD-10-CM | POA: Diagnosis not present

## 2018-01-08 DIAGNOSIS — I129 Hypertensive chronic kidney disease with stage 1 through stage 4 chronic kidney disease, or unspecified chronic kidney disease: Secondary | ICD-10-CM | POA: Diagnosis not present

## 2018-01-08 DIAGNOSIS — R296 Repeated falls: Secondary | ICD-10-CM | POA: Diagnosis not present

## 2018-01-08 DIAGNOSIS — F0280 Dementia in other diseases classified elsewhere without behavioral disturbance: Secondary | ICD-10-CM | POA: Diagnosis not present

## 2018-01-08 DIAGNOSIS — G309 Alzheimer's disease, unspecified: Secondary | ICD-10-CM | POA: Diagnosis not present

## 2018-01-09 DIAGNOSIS — I6529 Occlusion and stenosis of unspecified carotid artery: Secondary | ICD-10-CM | POA: Diagnosis not present

## 2018-01-09 DIAGNOSIS — I10 Essential (primary) hypertension: Secondary | ICD-10-CM | POA: Diagnosis not present

## 2018-01-09 DIAGNOSIS — K5900 Constipation, unspecified: Secondary | ICD-10-CM | POA: Diagnosis not present

## 2018-01-12 DIAGNOSIS — F0280 Dementia in other diseases classified elsewhere without behavioral disturbance: Secondary | ICD-10-CM | POA: Diagnosis not present

## 2018-01-12 DIAGNOSIS — N183 Chronic kidney disease, stage 3 (moderate): Secondary | ICD-10-CM | POA: Diagnosis not present

## 2018-01-12 DIAGNOSIS — G309 Alzheimer's disease, unspecified: Secondary | ICD-10-CM | POA: Diagnosis not present

## 2018-01-12 DIAGNOSIS — R2689 Other abnormalities of gait and mobility: Secondary | ICD-10-CM | POA: Diagnosis not present

## 2018-01-12 DIAGNOSIS — I129 Hypertensive chronic kidney disease with stage 1 through stage 4 chronic kidney disease, or unspecified chronic kidney disease: Secondary | ICD-10-CM | POA: Diagnosis not present

## 2018-01-12 DIAGNOSIS — R296 Repeated falls: Secondary | ICD-10-CM | POA: Diagnosis not present

## 2018-01-15 DIAGNOSIS — F0280 Dementia in other diseases classified elsewhere without behavioral disturbance: Secondary | ICD-10-CM | POA: Diagnosis not present

## 2018-01-15 DIAGNOSIS — R296 Repeated falls: Secondary | ICD-10-CM | POA: Diagnosis not present

## 2018-01-15 DIAGNOSIS — N183 Chronic kidney disease, stage 3 (moderate): Secondary | ICD-10-CM | POA: Diagnosis not present

## 2018-01-15 DIAGNOSIS — G309 Alzheimer's disease, unspecified: Secondary | ICD-10-CM | POA: Diagnosis not present

## 2018-01-15 DIAGNOSIS — R2689 Other abnormalities of gait and mobility: Secondary | ICD-10-CM | POA: Diagnosis not present

## 2018-01-15 DIAGNOSIS — I129 Hypertensive chronic kidney disease with stage 1 through stage 4 chronic kidney disease, or unspecified chronic kidney disease: Secondary | ICD-10-CM | POA: Diagnosis not present

## 2018-01-16 ENCOUNTER — Telehealth: Payer: Self-pay | Admitting: Family Medicine

## 2018-01-16 DIAGNOSIS — I10 Essential (primary) hypertension: Secondary | ICD-10-CM | POA: Diagnosis not present

## 2018-01-16 DIAGNOSIS — Z9181 History of falling: Secondary | ICD-10-CM | POA: Diagnosis not present

## 2018-01-16 DIAGNOSIS — N183 Chronic kidney disease, stage 3 (moderate): Secondary | ICD-10-CM | POA: Diagnosis not present

## 2018-01-16 DIAGNOSIS — F0390 Unspecified dementia without behavioral disturbance: Secondary | ICD-10-CM | POA: Diagnosis not present

## 2018-01-16 NOTE — Telephone Encounter (Signed)
Faxed over

## 2018-01-16 NOTE — Telephone Encounter (Signed)
[facility] calling to ask if patient has had her flu or pneomia shot. I dont see it. Please fax copy if so Cb#: [phone] Maudie Mercury) Fax#:  [phone]

## 2018-01-19 DIAGNOSIS — R2689 Other abnormalities of gait and mobility: Secondary | ICD-10-CM | POA: Diagnosis not present

## 2018-01-19 DIAGNOSIS — F0280 Dementia in other diseases classified elsewhere without behavioral disturbance: Secondary | ICD-10-CM | POA: Diagnosis not present

## 2018-01-19 DIAGNOSIS — R296 Repeated falls: Secondary | ICD-10-CM | POA: Diagnosis not present

## 2018-01-19 DIAGNOSIS — I129 Hypertensive chronic kidney disease with stage 1 through stage 4 chronic kidney disease, or unspecified chronic kidney disease: Secondary | ICD-10-CM | POA: Diagnosis not present

## 2018-01-19 DIAGNOSIS — G309 Alzheimer's disease, unspecified: Secondary | ICD-10-CM | POA: Diagnosis not present

## 2018-01-19 DIAGNOSIS — N183 Chronic kidney disease, stage 3 (moderate): Secondary | ICD-10-CM | POA: Diagnosis not present

## 2018-01-22 DIAGNOSIS — R2689 Other abnormalities of gait and mobility: Secondary | ICD-10-CM | POA: Diagnosis not present

## 2018-01-22 DIAGNOSIS — I129 Hypertensive chronic kidney disease with stage 1 through stage 4 chronic kidney disease, or unspecified chronic kidney disease: Secondary | ICD-10-CM | POA: Diagnosis not present

## 2018-01-22 DIAGNOSIS — R296 Repeated falls: Secondary | ICD-10-CM | POA: Diagnosis not present

## 2018-01-22 DIAGNOSIS — F0280 Dementia in other diseases classified elsewhere without behavioral disturbance: Secondary | ICD-10-CM | POA: Diagnosis not present

## 2018-01-22 DIAGNOSIS — N183 Chronic kidney disease, stage 3 (moderate): Secondary | ICD-10-CM | POA: Diagnosis not present

## 2018-01-22 DIAGNOSIS — G309 Alzheimer's disease, unspecified: Secondary | ICD-10-CM | POA: Diagnosis not present

## 2018-01-26 DIAGNOSIS — M2012 Hallux valgus (acquired), left foot: Secondary | ICD-10-CM | POA: Diagnosis not present

## 2018-01-26 DIAGNOSIS — G309 Alzheimer's disease, unspecified: Secondary | ICD-10-CM | POA: Diagnosis not present

## 2018-01-26 DIAGNOSIS — F0280 Dementia in other diseases classified elsewhere without behavioral disturbance: Secondary | ICD-10-CM | POA: Diagnosis not present

## 2018-01-26 DIAGNOSIS — I129 Hypertensive chronic kidney disease with stage 1 through stage 4 chronic kidney disease, or unspecified chronic kidney disease: Secondary | ICD-10-CM | POA: Diagnosis not present

## 2018-01-26 DIAGNOSIS — R296 Repeated falls: Secondary | ICD-10-CM | POA: Diagnosis not present

## 2018-01-26 DIAGNOSIS — B351 Tinea unguium: Secondary | ICD-10-CM | POA: Diagnosis not present

## 2018-01-26 DIAGNOSIS — N189 Chronic kidney disease, unspecified: Secondary | ICD-10-CM | POA: Diagnosis not present

## 2018-01-26 DIAGNOSIS — R2689 Other abnormalities of gait and mobility: Secondary | ICD-10-CM | POA: Diagnosis not present

## 2018-01-26 DIAGNOSIS — L84 Corns and callosities: Secondary | ICD-10-CM | POA: Diagnosis not present

## 2018-01-26 DIAGNOSIS — N183 Chronic kidney disease, stage 3 (moderate): Secondary | ICD-10-CM | POA: Diagnosis not present

## 2018-01-26 DIAGNOSIS — M79675 Pain in left toe(s): Secondary | ICD-10-CM | POA: Diagnosis not present

## 2018-01-29 DIAGNOSIS — R296 Repeated falls: Secondary | ICD-10-CM | POA: Diagnosis not present

## 2018-01-29 DIAGNOSIS — R2689 Other abnormalities of gait and mobility: Secondary | ICD-10-CM | POA: Diagnosis not present

## 2018-01-29 DIAGNOSIS — I129 Hypertensive chronic kidney disease with stage 1 through stage 4 chronic kidney disease, or unspecified chronic kidney disease: Secondary | ICD-10-CM | POA: Diagnosis not present

## 2018-01-29 DIAGNOSIS — G309 Alzheimer's disease, unspecified: Secondary | ICD-10-CM | POA: Diagnosis not present

## 2018-01-29 DIAGNOSIS — F0280 Dementia in other diseases classified elsewhere without behavioral disturbance: Secondary | ICD-10-CM | POA: Diagnosis not present

## 2018-01-29 DIAGNOSIS — N183 Chronic kidney disease, stage 3 (moderate): Secondary | ICD-10-CM | POA: Diagnosis not present

## 2018-01-31 DIAGNOSIS — I10 Essential (primary) hypertension: Secondary | ICD-10-CM | POA: Diagnosis not present

## 2018-02-02 DIAGNOSIS — G309 Alzheimer's disease, unspecified: Secondary | ICD-10-CM | POA: Diagnosis not present

## 2018-02-02 DIAGNOSIS — I129 Hypertensive chronic kidney disease with stage 1 through stage 4 chronic kidney disease, or unspecified chronic kidney disease: Secondary | ICD-10-CM | POA: Diagnosis not present

## 2018-02-02 DIAGNOSIS — F0280 Dementia in other diseases classified elsewhere without behavioral disturbance: Secondary | ICD-10-CM | POA: Diagnosis not present

## 2018-02-02 DIAGNOSIS — R2689 Other abnormalities of gait and mobility: Secondary | ICD-10-CM | POA: Diagnosis not present

## 2018-02-02 DIAGNOSIS — N183 Chronic kidney disease, stage 3 (moderate): Secondary | ICD-10-CM | POA: Diagnosis not present

## 2018-02-02 DIAGNOSIS — R296 Repeated falls: Secondary | ICD-10-CM | POA: Diagnosis not present

## 2018-02-05 DIAGNOSIS — F0280 Dementia in other diseases classified elsewhere without behavioral disturbance: Secondary | ICD-10-CM | POA: Diagnosis not present

## 2018-02-05 DIAGNOSIS — R296 Repeated falls: Secondary | ICD-10-CM | POA: Diagnosis not present

## 2018-02-05 DIAGNOSIS — G4700 Insomnia, unspecified: Secondary | ICD-10-CM | POA: Diagnosis not present

## 2018-02-05 DIAGNOSIS — G309 Alzheimer's disease, unspecified: Secondary | ICD-10-CM | POA: Diagnosis not present

## 2018-02-05 DIAGNOSIS — I10 Essential (primary) hypertension: Secondary | ICD-10-CM | POA: Diagnosis not present

## 2018-02-05 DIAGNOSIS — I129 Hypertensive chronic kidney disease with stage 1 through stage 4 chronic kidney disease, or unspecified chronic kidney disease: Secondary | ICD-10-CM | POA: Diagnosis not present

## 2018-02-05 DIAGNOSIS — R2689 Other abnormalities of gait and mobility: Secondary | ICD-10-CM | POA: Diagnosis not present

## 2018-02-05 DIAGNOSIS — N183 Chronic kidney disease, stage 3 (moderate): Secondary | ICD-10-CM | POA: Diagnosis not present

## 2018-02-09 DIAGNOSIS — F0280 Dementia in other diseases classified elsewhere without behavioral disturbance: Secondary | ICD-10-CM | POA: Diagnosis not present

## 2018-02-09 DIAGNOSIS — R2689 Other abnormalities of gait and mobility: Secondary | ICD-10-CM | POA: Diagnosis not present

## 2018-02-09 DIAGNOSIS — G309 Alzheimer's disease, unspecified: Secondary | ICD-10-CM | POA: Diagnosis not present

## 2018-02-09 DIAGNOSIS — I129 Hypertensive chronic kidney disease with stage 1 through stage 4 chronic kidney disease, or unspecified chronic kidney disease: Secondary | ICD-10-CM | POA: Diagnosis not present

## 2018-02-09 DIAGNOSIS — N183 Chronic kidney disease, stage 3 (moderate): Secondary | ICD-10-CM | POA: Diagnosis not present

## 2018-02-09 DIAGNOSIS — R296 Repeated falls: Secondary | ICD-10-CM | POA: Diagnosis not present

## 2018-02-12 DIAGNOSIS — F0280 Dementia in other diseases classified elsewhere without behavioral disturbance: Secondary | ICD-10-CM | POA: Diagnosis not present

## 2018-02-12 DIAGNOSIS — R296 Repeated falls: Secondary | ICD-10-CM | POA: Diagnosis not present

## 2018-02-12 DIAGNOSIS — I129 Hypertensive chronic kidney disease with stage 1 through stage 4 chronic kidney disease, or unspecified chronic kidney disease: Secondary | ICD-10-CM | POA: Diagnosis not present

## 2018-02-12 DIAGNOSIS — R2689 Other abnormalities of gait and mobility: Secondary | ICD-10-CM | POA: Diagnosis not present

## 2018-02-12 DIAGNOSIS — G309 Alzheimer's disease, unspecified: Secondary | ICD-10-CM | POA: Diagnosis not present

## 2018-02-12 DIAGNOSIS — N183 Chronic kidney disease, stage 3 (moderate): Secondary | ICD-10-CM | POA: Diagnosis not present

## 2018-02-14 DIAGNOSIS — G309 Alzheimer's disease, unspecified: Secondary | ICD-10-CM | POA: Diagnosis not present

## 2018-02-14 DIAGNOSIS — F0280 Dementia in other diseases classified elsewhere without behavioral disturbance: Secondary | ICD-10-CM | POA: Diagnosis not present

## 2018-02-14 DIAGNOSIS — F0390 Unspecified dementia without behavioral disturbance: Secondary | ICD-10-CM | POA: Diagnosis not present

## 2018-02-14 DIAGNOSIS — Z79899 Other long term (current) drug therapy: Secondary | ICD-10-CM | POA: Diagnosis not present

## 2018-02-14 DIAGNOSIS — I129 Hypertensive chronic kidney disease with stage 1 through stage 4 chronic kidney disease, or unspecified chronic kidney disease: Secondary | ICD-10-CM | POA: Diagnosis not present

## 2018-02-14 DIAGNOSIS — I739 Peripheral vascular disease, unspecified: Secondary | ICD-10-CM | POA: Diagnosis not present

## 2018-02-14 DIAGNOSIS — G301 Alzheimer's disease with late onset: Secondary | ICD-10-CM | POA: Diagnosis not present

## 2018-02-14 DIAGNOSIS — Z86718 Personal history of other venous thrombosis and embolism: Secondary | ICD-10-CM | POA: Diagnosis not present

## 2018-02-14 DIAGNOSIS — Z8673 Personal history of transient ischemic attack (TIA), and cerebral infarction without residual deficits: Secondary | ICD-10-CM | POA: Diagnosis not present

## 2018-02-14 DIAGNOSIS — B009 Herpesviral infection, unspecified: Secondary | ICD-10-CM | POA: Diagnosis not present

## 2018-02-14 DIAGNOSIS — I872 Venous insufficiency (chronic) (peripheral): Secondary | ICD-10-CM | POA: Diagnosis not present

## 2018-02-14 DIAGNOSIS — N183 Chronic kidney disease, stage 3 (moderate): Secondary | ICD-10-CM | POA: Diagnosis not present

## 2018-02-14 DIAGNOSIS — J449 Chronic obstructive pulmonary disease, unspecified: Secondary | ICD-10-CM | POA: Diagnosis not present

## 2018-02-14 DIAGNOSIS — G4701 Insomnia due to medical condition: Secondary | ICD-10-CM | POA: Diagnosis not present

## 2018-02-14 DIAGNOSIS — D631 Anemia in chronic kidney disease: Secondary | ICD-10-CM | POA: Diagnosis not present

## 2018-02-14 DIAGNOSIS — I272 Pulmonary hypertension, unspecified: Secondary | ICD-10-CM | POA: Diagnosis not present

## 2018-02-14 DIAGNOSIS — R2689 Other abnormalities of gait and mobility: Secondary | ICD-10-CM | POA: Diagnosis not present

## 2018-02-14 DIAGNOSIS — I4891 Unspecified atrial fibrillation: Secondary | ICD-10-CM | POA: Diagnosis not present

## 2018-02-14 DIAGNOSIS — Z9181 History of falling: Secondary | ICD-10-CM | POA: Diagnosis not present

## 2018-02-14 DIAGNOSIS — I10 Essential (primary) hypertension: Secondary | ICD-10-CM | POA: Diagnosis not present

## 2018-02-16 DIAGNOSIS — G309 Alzheimer's disease, unspecified: Secondary | ICD-10-CM | POA: Diagnosis not present

## 2018-02-16 DIAGNOSIS — I129 Hypertensive chronic kidney disease with stage 1 through stage 4 chronic kidney disease, or unspecified chronic kidney disease: Secondary | ICD-10-CM | POA: Diagnosis not present

## 2018-02-16 DIAGNOSIS — R2689 Other abnormalities of gait and mobility: Secondary | ICD-10-CM | POA: Diagnosis not present

## 2018-02-16 DIAGNOSIS — D631 Anemia in chronic kidney disease: Secondary | ICD-10-CM | POA: Diagnosis not present

## 2018-02-16 DIAGNOSIS — F0280 Dementia in other diseases classified elsewhere without behavioral disturbance: Secondary | ICD-10-CM | POA: Diagnosis not present

## 2018-02-16 DIAGNOSIS — N183 Chronic kidney disease, stage 3 (moderate): Secondary | ICD-10-CM | POA: Diagnosis not present

## 2018-02-19 ENCOUNTER — Encounter (HOSPITAL_COMMUNITY): Payer: Self-pay

## 2018-02-19 ENCOUNTER — Inpatient Hospital Stay (HOSPITAL_COMMUNITY): Payer: Medicare Other | Attending: Internal Medicine

## 2018-02-19 DIAGNOSIS — F0280 Dementia in other diseases classified elsewhere without behavioral disturbance: Secondary | ICD-10-CM | POA: Diagnosis not present

## 2018-02-19 DIAGNOSIS — R2689 Other abnormalities of gait and mobility: Secondary | ICD-10-CM | POA: Diagnosis not present

## 2018-02-19 DIAGNOSIS — Z452 Encounter for adjustment and management of vascular access device: Secondary | ICD-10-CM | POA: Diagnosis not present

## 2018-02-19 DIAGNOSIS — N183 Chronic kidney disease, stage 3 (moderate): Secondary | ICD-10-CM | POA: Diagnosis not present

## 2018-02-19 DIAGNOSIS — D631 Anemia in chronic kidney disease: Secondary | ICD-10-CM | POA: Diagnosis not present

## 2018-02-19 DIAGNOSIS — I129 Hypertensive chronic kidney disease with stage 1 through stage 4 chronic kidney disease, or unspecified chronic kidney disease: Secondary | ICD-10-CM | POA: Diagnosis not present

## 2018-02-19 DIAGNOSIS — Z853 Personal history of malignant neoplasm of breast: Secondary | ICD-10-CM | POA: Diagnosis not present

## 2018-02-19 DIAGNOSIS — G309 Alzheimer's disease, unspecified: Secondary | ICD-10-CM | POA: Diagnosis not present

## 2018-02-19 MED ORDER — HEPARIN SOD (PORK) LOCK FLUSH 100 UNIT/ML IV SOLN
500.0000 [IU] | Freq: Once | INTRAVENOUS | Status: AC
Start: 2018-02-19 — End: 2018-02-19
  Administered 2018-02-19: 500 [IU] via INTRAVENOUS

## 2018-02-19 MED ORDER — SODIUM CHLORIDE 0.9% FLUSH
10.0000 mL | INTRAVENOUS | Status: DC | PRN
  Administered 2018-02-19: 10 mL via INTRAVENOUS
  Filled 2018-02-19: qty 10

## 2018-02-19 NOTE — Progress Notes (Signed)
Emma Dawson tolerated portacath flush well without complaints or incident. Port accessed with 20 gauge needle without blood return but flushed easily with 10 ml NS and 5 ml Heparin without discomfort or swelling then de-accessed. VSS Pt discharged via wheelchair in satisfactory condition accompanied by caregiver

## 2018-02-19 NOTE — Patient Instructions (Signed)
[facility] at [facility] Discharge Instructions  Portacath flushed per protocol today. Follow-up as scheduled. Call clinic for any questions or concerns   Thank you for choosing [facility] at [facility] to provide your oncology and hematology care.  To afford each patient quality time with our provider, please arrive at least 15 minutes before your scheduled appointment time.   If you have a lab appointment with the Cancer Center please come in thru the  Main Entrance and check in at the main information desk  You need to re-schedule your appointment should you arrive 10 or more minutes late.  We strive to give you quality time with our providers, and arriving late affects you and other patients whose appointments are after yours.  Also, if you no show three or more times for appointments you may be dismissed from the clinic at the providers discretion.     Again, thank you for choosing [facility].  Our hope is that these requests will decrease the amount of time that you wait before being seen by our physicians.       _____________________________________________________________  Should you have questions after your visit to [facility], please contact our office at [phone] between the hours of 8:30 a.m. and 4:30 p.m.  Voicemails left after 4:30 p.m. will not be returned until the following business day.  For prescription refill requests, have your pharmacy contact our office.       Resources For Cancer Patients and their Caregivers ? American Cancer Society: Can assist with transportation, wigs, general needs, runs Look Good Feel Better.        1-888-227-6333 ? Cancer Care: Provides financial assistance, online support groups, medication/co-pay assistance.  1-800-813-HOPE (4673) ? [facility] Assists [place] cancer patients and their families through emotional , educational and  financial support.  336-427-4357 ? [place] DSS Where to apply for food stamps, Medicaid and utility assistance. 336-342-1394 ? RCATS: Transportation to medical appointments. 336-347-2287 ? Social Security Administration: May apply for disability if have a Stage IV cancer. 336-342-7796 1-800-772-1213 ? [place] Aging, Disability and Transit Services: Assists with nutrition, care and transit needs. 336-349-2343  Cancer Center Support Programs:   > Cancer Support Group  2nd Tuesday of the month 1pm-2pm, Journey Room   > Creative Journey  3rd Tuesday of the month 1130am-1pm, Journey Room     

## 2018-02-23 DIAGNOSIS — R2689 Other abnormalities of gait and mobility: Secondary | ICD-10-CM | POA: Diagnosis not present

## 2018-02-23 DIAGNOSIS — D631 Anemia in chronic kidney disease: Secondary | ICD-10-CM | POA: Diagnosis not present

## 2018-02-23 DIAGNOSIS — G309 Alzheimer's disease, unspecified: Secondary | ICD-10-CM | POA: Diagnosis not present

## 2018-02-23 DIAGNOSIS — I129 Hypertensive chronic kidney disease with stage 1 through stage 4 chronic kidney disease, or unspecified chronic kidney disease: Secondary | ICD-10-CM | POA: Diagnosis not present

## 2018-02-23 DIAGNOSIS — N183 Chronic kidney disease, stage 3 (moderate): Secondary | ICD-10-CM | POA: Diagnosis not present

## 2018-02-23 DIAGNOSIS — F0280 Dementia in other diseases classified elsewhere without behavioral disturbance: Secondary | ICD-10-CM | POA: Diagnosis not present

## 2018-02-26 DIAGNOSIS — G309 Alzheimer's disease, unspecified: Secondary | ICD-10-CM | POA: Diagnosis not present

## 2018-02-26 DIAGNOSIS — F0280 Dementia in other diseases classified elsewhere without behavioral disturbance: Secondary | ICD-10-CM | POA: Diagnosis not present

## 2018-02-26 DIAGNOSIS — N183 Chronic kidney disease, stage 3 (moderate): Secondary | ICD-10-CM | POA: Diagnosis not present

## 2018-02-26 DIAGNOSIS — R2689 Other abnormalities of gait and mobility: Secondary | ICD-10-CM | POA: Diagnosis not present

## 2018-02-26 DIAGNOSIS — I129 Hypertensive chronic kidney disease with stage 1 through stage 4 chronic kidney disease, or unspecified chronic kidney disease: Secondary | ICD-10-CM | POA: Diagnosis not present

## 2018-02-26 DIAGNOSIS — D631 Anemia in chronic kidney disease: Secondary | ICD-10-CM | POA: Diagnosis not present

## 2018-03-02 DIAGNOSIS — D631 Anemia in chronic kidney disease: Secondary | ICD-10-CM | POA: Diagnosis not present

## 2018-03-02 DIAGNOSIS — R2689 Other abnormalities of gait and mobility: Secondary | ICD-10-CM | POA: Diagnosis not present

## 2018-03-02 DIAGNOSIS — G309 Alzheimer's disease, unspecified: Secondary | ICD-10-CM | POA: Diagnosis not present

## 2018-03-02 DIAGNOSIS — N183 Chronic kidney disease, stage 3 (moderate): Secondary | ICD-10-CM | POA: Diagnosis not present

## 2018-03-02 DIAGNOSIS — I129 Hypertensive chronic kidney disease with stage 1 through stage 4 chronic kidney disease, or unspecified chronic kidney disease: Secondary | ICD-10-CM | POA: Diagnosis not present

## 2018-03-02 DIAGNOSIS — F0280 Dementia in other diseases classified elsewhere without behavioral disturbance: Secondary | ICD-10-CM | POA: Diagnosis not present

## 2018-03-05 DIAGNOSIS — D631 Anemia in chronic kidney disease: Secondary | ICD-10-CM | POA: Diagnosis not present

## 2018-03-05 DIAGNOSIS — F0280 Dementia in other diseases classified elsewhere without behavioral disturbance: Secondary | ICD-10-CM | POA: Diagnosis not present

## 2018-03-05 DIAGNOSIS — G4700 Insomnia, unspecified: Secondary | ICD-10-CM | POA: Diagnosis not present

## 2018-03-05 DIAGNOSIS — N183 Chronic kidney disease, stage 3 (moderate): Secondary | ICD-10-CM | POA: Diagnosis not present

## 2018-03-05 DIAGNOSIS — I129 Hypertensive chronic kidney disease with stage 1 through stage 4 chronic kidney disease, or unspecified chronic kidney disease: Secondary | ICD-10-CM | POA: Diagnosis not present

## 2018-03-05 DIAGNOSIS — K5900 Constipation, unspecified: Secondary | ICD-10-CM | POA: Diagnosis not present

## 2018-03-05 DIAGNOSIS — I6529 Occlusion and stenosis of unspecified carotid artery: Secondary | ICD-10-CM | POA: Diagnosis not present

## 2018-03-05 DIAGNOSIS — G309 Alzheimer's disease, unspecified: Secondary | ICD-10-CM | POA: Diagnosis not present

## 2018-03-05 DIAGNOSIS — R2689 Other abnormalities of gait and mobility: Secondary | ICD-10-CM | POA: Diagnosis not present

## 2018-03-07 DIAGNOSIS — D631 Anemia in chronic kidney disease: Secondary | ICD-10-CM | POA: Diagnosis not present

## 2018-03-07 DIAGNOSIS — N183 Chronic kidney disease, stage 3 (moderate): Secondary | ICD-10-CM | POA: Diagnosis not present

## 2018-03-07 DIAGNOSIS — G309 Alzheimer's disease, unspecified: Secondary | ICD-10-CM | POA: Diagnosis not present

## 2018-03-07 DIAGNOSIS — F0280 Dementia in other diseases classified elsewhere without behavioral disturbance: Secondary | ICD-10-CM | POA: Diagnosis not present

## 2018-03-07 DIAGNOSIS — I129 Hypertensive chronic kidney disease with stage 1 through stage 4 chronic kidney disease, or unspecified chronic kidney disease: Secondary | ICD-10-CM | POA: Diagnosis not present

## 2018-03-07 DIAGNOSIS — R2689 Other abnormalities of gait and mobility: Secondary | ICD-10-CM | POA: Diagnosis not present

## 2018-03-12 DIAGNOSIS — I129 Hypertensive chronic kidney disease with stage 1 through stage 4 chronic kidney disease, or unspecified chronic kidney disease: Secondary | ICD-10-CM | POA: Diagnosis not present

## 2018-03-12 DIAGNOSIS — R2689 Other abnormalities of gait and mobility: Secondary | ICD-10-CM | POA: Diagnosis not present

## 2018-03-12 DIAGNOSIS — G309 Alzheimer's disease, unspecified: Secondary | ICD-10-CM | POA: Diagnosis not present

## 2018-03-12 DIAGNOSIS — N183 Chronic kidney disease, stage 3 (moderate): Secondary | ICD-10-CM | POA: Diagnosis not present

## 2018-03-12 DIAGNOSIS — F0280 Dementia in other diseases classified elsewhere without behavioral disturbance: Secondary | ICD-10-CM | POA: Diagnosis not present

## 2018-03-12 DIAGNOSIS — D631 Anemia in chronic kidney disease: Secondary | ICD-10-CM | POA: Diagnosis not present

## 2018-03-14 DIAGNOSIS — R2689 Other abnormalities of gait and mobility: Secondary | ICD-10-CM | POA: Diagnosis not present

## 2018-03-14 DIAGNOSIS — G309 Alzheimer's disease, unspecified: Secondary | ICD-10-CM | POA: Diagnosis not present

## 2018-03-14 DIAGNOSIS — I129 Hypertensive chronic kidney disease with stage 1 through stage 4 chronic kidney disease, or unspecified chronic kidney disease: Secondary | ICD-10-CM | POA: Diagnosis not present

## 2018-03-14 DIAGNOSIS — D631 Anemia in chronic kidney disease: Secondary | ICD-10-CM | POA: Diagnosis not present

## 2018-03-14 DIAGNOSIS — N183 Chronic kidney disease, stage 3 (moderate): Secondary | ICD-10-CM | POA: Diagnosis not present

## 2018-03-14 DIAGNOSIS — F0280 Dementia in other diseases classified elsewhere without behavioral disturbance: Secondary | ICD-10-CM | POA: Diagnosis not present

## 2018-03-17 DIAGNOSIS — G4700 Insomnia, unspecified: Secondary | ICD-10-CM | POA: Diagnosis not present

## 2018-03-17 DIAGNOSIS — R0602 Shortness of breath: Secondary | ICD-10-CM | POA: Diagnosis not present

## 2018-03-17 DIAGNOSIS — N183 Chronic kidney disease, stage 3 (moderate): Secondary | ICD-10-CM | POA: Diagnosis not present

## 2018-03-17 DIAGNOSIS — J449 Chronic obstructive pulmonary disease, unspecified: Secondary | ICD-10-CM | POA: Diagnosis not present

## 2018-03-17 DIAGNOSIS — K5900 Constipation, unspecified: Secondary | ICD-10-CM | POA: Diagnosis not present

## 2018-03-17 DIAGNOSIS — I10 Essential (primary) hypertension: Secondary | ICD-10-CM | POA: Diagnosis not present

## 2018-03-19 DIAGNOSIS — G309 Alzheimer's disease, unspecified: Secondary | ICD-10-CM | POA: Diagnosis not present

## 2018-03-19 DIAGNOSIS — N183 Chronic kidney disease, stage 3 (moderate): Secondary | ICD-10-CM | POA: Diagnosis not present

## 2018-03-19 DIAGNOSIS — D631 Anemia in chronic kidney disease: Secondary | ICD-10-CM | POA: Diagnosis not present

## 2018-03-19 DIAGNOSIS — I129 Hypertensive chronic kidney disease with stage 1 through stage 4 chronic kidney disease, or unspecified chronic kidney disease: Secondary | ICD-10-CM | POA: Diagnosis not present

## 2018-03-19 DIAGNOSIS — R2689 Other abnormalities of gait and mobility: Secondary | ICD-10-CM | POA: Diagnosis not present

## 2018-03-19 DIAGNOSIS — F0280 Dementia in other diseases classified elsewhere without behavioral disturbance: Secondary | ICD-10-CM | POA: Diagnosis not present

## 2018-03-23 DIAGNOSIS — F0280 Dementia in other diseases classified elsewhere without behavioral disturbance: Secondary | ICD-10-CM | POA: Diagnosis not present

## 2018-03-23 DIAGNOSIS — I129 Hypertensive chronic kidney disease with stage 1 through stage 4 chronic kidney disease, or unspecified chronic kidney disease: Secondary | ICD-10-CM | POA: Diagnosis not present

## 2018-03-23 DIAGNOSIS — N183 Chronic kidney disease, stage 3 (moderate): Secondary | ICD-10-CM | POA: Diagnosis not present

## 2018-03-23 DIAGNOSIS — G309 Alzheimer's disease, unspecified: Secondary | ICD-10-CM | POA: Diagnosis not present

## 2018-03-23 DIAGNOSIS — D631 Anemia in chronic kidney disease: Secondary | ICD-10-CM | POA: Diagnosis not present

## 2018-03-23 DIAGNOSIS — R2689 Other abnormalities of gait and mobility: Secondary | ICD-10-CM | POA: Diagnosis not present

## 2018-03-26 DIAGNOSIS — I129 Hypertensive chronic kidney disease with stage 1 through stage 4 chronic kidney disease, or unspecified chronic kidney disease: Secondary | ICD-10-CM | POA: Diagnosis not present

## 2018-03-26 DIAGNOSIS — D631 Anemia in chronic kidney disease: Secondary | ICD-10-CM | POA: Diagnosis not present

## 2018-03-26 DIAGNOSIS — N183 Chronic kidney disease, stage 3 (moderate): Secondary | ICD-10-CM | POA: Diagnosis not present

## 2018-03-26 DIAGNOSIS — F0280 Dementia in other diseases classified elsewhere without behavioral disturbance: Secondary | ICD-10-CM | POA: Diagnosis not present

## 2018-03-26 DIAGNOSIS — G309 Alzheimer's disease, unspecified: Secondary | ICD-10-CM | POA: Diagnosis not present

## 2018-03-26 DIAGNOSIS — R2689 Other abnormalities of gait and mobility: Secondary | ICD-10-CM | POA: Diagnosis not present

## 2018-03-30 DIAGNOSIS — N183 Chronic kidney disease, stage 3 (moderate): Secondary | ICD-10-CM | POA: Diagnosis not present

## 2018-03-30 DIAGNOSIS — F0280 Dementia in other diseases classified elsewhere without behavioral disturbance: Secondary | ICD-10-CM | POA: Diagnosis not present

## 2018-03-30 DIAGNOSIS — R2689 Other abnormalities of gait and mobility: Secondary | ICD-10-CM | POA: Diagnosis not present

## 2018-03-30 DIAGNOSIS — G309 Alzheimer's disease, unspecified: Secondary | ICD-10-CM | POA: Diagnosis not present

## 2018-03-30 DIAGNOSIS — D631 Anemia in chronic kidney disease: Secondary | ICD-10-CM | POA: Diagnosis not present

## 2018-03-30 DIAGNOSIS — I129 Hypertensive chronic kidney disease with stage 1 through stage 4 chronic kidney disease, or unspecified chronic kidney disease: Secondary | ICD-10-CM | POA: Diagnosis not present

## 2018-04-02 ENCOUNTER — Ambulatory Visit: Payer: Medicare Other

## 2018-04-02 DIAGNOSIS — G309 Alzheimer's disease, unspecified: Secondary | ICD-10-CM | POA: Diagnosis not present

## 2018-04-02 DIAGNOSIS — I129 Hypertensive chronic kidney disease with stage 1 through stage 4 chronic kidney disease, or unspecified chronic kidney disease: Secondary | ICD-10-CM | POA: Diagnosis not present

## 2018-04-02 DIAGNOSIS — F0280 Dementia in other diseases classified elsewhere without behavioral disturbance: Secondary | ICD-10-CM | POA: Diagnosis not present

## 2018-04-02 DIAGNOSIS — N183 Chronic kidney disease, stage 3 (moderate): Secondary | ICD-10-CM | POA: Diagnosis not present

## 2018-04-02 DIAGNOSIS — I10 Essential (primary) hypertension: Secondary | ICD-10-CM | POA: Diagnosis not present

## 2018-04-02 DIAGNOSIS — D631 Anemia in chronic kidney disease: Secondary | ICD-10-CM | POA: Diagnosis not present

## 2018-04-02 DIAGNOSIS — R2689 Other abnormalities of gait and mobility: Secondary | ICD-10-CM | POA: Diagnosis not present

## 2018-04-06 DIAGNOSIS — D631 Anemia in chronic kidney disease: Secondary | ICD-10-CM | POA: Diagnosis not present

## 2018-04-06 DIAGNOSIS — N183 Chronic kidney disease, stage 3 (moderate): Secondary | ICD-10-CM | POA: Diagnosis not present

## 2018-04-06 DIAGNOSIS — I129 Hypertensive chronic kidney disease with stage 1 through stage 4 chronic kidney disease, or unspecified chronic kidney disease: Secondary | ICD-10-CM | POA: Diagnosis not present

## 2018-04-06 DIAGNOSIS — R2689 Other abnormalities of gait and mobility: Secondary | ICD-10-CM | POA: Diagnosis not present

## 2018-04-06 DIAGNOSIS — G309 Alzheimer's disease, unspecified: Secondary | ICD-10-CM | POA: Diagnosis not present

## 2018-04-06 DIAGNOSIS — F0280 Dementia in other diseases classified elsewhere without behavioral disturbance: Secondary | ICD-10-CM | POA: Diagnosis not present

## 2018-04-09 DIAGNOSIS — G309 Alzheimer's disease, unspecified: Secondary | ICD-10-CM | POA: Diagnosis not present

## 2018-04-09 DIAGNOSIS — F0280 Dementia in other diseases classified elsewhere without behavioral disturbance: Secondary | ICD-10-CM | POA: Diagnosis not present

## 2018-04-09 DIAGNOSIS — R2689 Other abnormalities of gait and mobility: Secondary | ICD-10-CM | POA: Diagnosis not present

## 2018-04-09 DIAGNOSIS — N183 Chronic kidney disease, stage 3 (moderate): Secondary | ICD-10-CM | POA: Diagnosis not present

## 2018-04-09 DIAGNOSIS — I129 Hypertensive chronic kidney disease with stage 1 through stage 4 chronic kidney disease, or unspecified chronic kidney disease: Secondary | ICD-10-CM | POA: Diagnosis not present

## 2018-04-09 DIAGNOSIS — D631 Anemia in chronic kidney disease: Secondary | ICD-10-CM | POA: Diagnosis not present

## 2018-04-11 DIAGNOSIS — K5900 Constipation, unspecified: Secondary | ICD-10-CM | POA: Diagnosis not present

## 2018-04-11 DIAGNOSIS — F0280 Dementia in other diseases classified elsewhere without behavioral disturbance: Secondary | ICD-10-CM | POA: Diagnosis not present

## 2018-04-11 DIAGNOSIS — N183 Chronic kidney disease, stage 3 (moderate): Secondary | ICD-10-CM | POA: Diagnosis not present

## 2018-04-11 DIAGNOSIS — G309 Alzheimer's disease, unspecified: Secondary | ICD-10-CM | POA: Diagnosis not present

## 2018-04-11 DIAGNOSIS — I10 Essential (primary) hypertension: Secondary | ICD-10-CM | POA: Diagnosis not present

## 2018-04-11 DIAGNOSIS — R2689 Other abnormalities of gait and mobility: Secondary | ICD-10-CM | POA: Diagnosis not present

## 2018-04-11 DIAGNOSIS — F0390 Unspecified dementia without behavioral disturbance: Secondary | ICD-10-CM | POA: Diagnosis not present

## 2018-04-11 DIAGNOSIS — D631 Anemia in chronic kidney disease: Secondary | ICD-10-CM | POA: Diagnosis not present

## 2018-04-11 DIAGNOSIS — I129 Hypertensive chronic kidney disease with stage 1 through stage 4 chronic kidney disease, or unspecified chronic kidney disease: Secondary | ICD-10-CM | POA: Diagnosis not present

## 2018-04-13 ENCOUNTER — Ambulatory Visit: Payer: Medicare Other

## 2018-04-15 DIAGNOSIS — G309 Alzheimer's disease, unspecified: Secondary | ICD-10-CM | POA: Diagnosis not present

## 2018-04-15 DIAGNOSIS — I272 Pulmonary hypertension, unspecified: Secondary | ICD-10-CM | POA: Diagnosis not present

## 2018-04-15 DIAGNOSIS — N183 Chronic kidney disease, stage 3 (moderate): Secondary | ICD-10-CM | POA: Diagnosis not present

## 2018-04-15 DIAGNOSIS — D631 Anemia in chronic kidney disease: Secondary | ICD-10-CM | POA: Diagnosis not present

## 2018-04-15 DIAGNOSIS — I129 Hypertensive chronic kidney disease with stage 1 through stage 4 chronic kidney disease, or unspecified chronic kidney disease: Secondary | ICD-10-CM | POA: Diagnosis not present

## 2018-04-15 DIAGNOSIS — Z86718 Personal history of other venous thrombosis and embolism: Secondary | ICD-10-CM | POA: Diagnosis not present

## 2018-04-15 DIAGNOSIS — I4891 Unspecified atrial fibrillation: Secondary | ICD-10-CM | POA: Diagnosis not present

## 2018-04-15 DIAGNOSIS — F0280 Dementia in other diseases classified elsewhere without behavioral disturbance: Secondary | ICD-10-CM | POA: Diagnosis not present

## 2018-04-15 DIAGNOSIS — I872 Venous insufficiency (chronic) (peripheral): Secondary | ICD-10-CM | POA: Diagnosis not present

## 2018-04-15 DIAGNOSIS — J449 Chronic obstructive pulmonary disease, unspecified: Secondary | ICD-10-CM | POA: Diagnosis not present

## 2018-04-15 DIAGNOSIS — I739 Peripheral vascular disease, unspecified: Secondary | ICD-10-CM | POA: Diagnosis not present

## 2018-04-15 DIAGNOSIS — Z9181 History of falling: Secondary | ICD-10-CM | POA: Diagnosis not present

## 2018-04-15 DIAGNOSIS — R2689 Other abnormalities of gait and mobility: Secondary | ICD-10-CM | POA: Diagnosis not present

## 2018-04-15 DIAGNOSIS — Z8673 Personal history of transient ischemic attack (TIA), and cerebral infarction without residual deficits: Secondary | ICD-10-CM | POA: Diagnosis not present

## 2018-04-16 DIAGNOSIS — N183 Chronic kidney disease, stage 3 (moderate): Secondary | ICD-10-CM | POA: Diagnosis not present

## 2018-04-16 DIAGNOSIS — G309 Alzheimer's disease, unspecified: Secondary | ICD-10-CM | POA: Diagnosis not present

## 2018-04-16 DIAGNOSIS — R2689 Other abnormalities of gait and mobility: Secondary | ICD-10-CM | POA: Diagnosis not present

## 2018-04-16 DIAGNOSIS — I129 Hypertensive chronic kidney disease with stage 1 through stage 4 chronic kidney disease, or unspecified chronic kidney disease: Secondary | ICD-10-CM | POA: Diagnosis not present

## 2018-04-16 DIAGNOSIS — F0280 Dementia in other diseases classified elsewhere without behavioral disturbance: Secondary | ICD-10-CM | POA: Diagnosis not present

## 2018-04-16 DIAGNOSIS — I872 Venous insufficiency (chronic) (peripheral): Secondary | ICD-10-CM | POA: Diagnosis not present

## 2018-04-19 ENCOUNTER — Encounter (HOSPITAL_COMMUNITY): Payer: Self-pay | Admitting: Internal Medicine

## 2018-04-19 ENCOUNTER — Inpatient Hospital Stay (HOSPITAL_COMMUNITY): Payer: Medicare Other | Attending: Internal Medicine | Admitting: Internal Medicine

## 2018-04-19 VITALS — BP 169/66 | HR 81 | Temp 98.4°F | Resp 16 | Wt 156.7 lb

## 2018-04-19 DIAGNOSIS — N183 Chronic kidney disease, stage 3 (moderate): Secondary | ICD-10-CM | POA: Insufficient documentation

## 2018-04-19 DIAGNOSIS — I129 Hypertensive chronic kidney disease with stage 1 through stage 4 chronic kidney disease, or unspecified chronic kidney disease: Secondary | ICD-10-CM

## 2018-04-19 DIAGNOSIS — C50911 Malignant neoplasm of unspecified site of right female breast: Secondary | ICD-10-CM

## 2018-04-19 DIAGNOSIS — Z853 Personal history of malignant neoplasm of breast: Secondary | ICD-10-CM

## 2018-04-19 MED ORDER — MISC. DEVICES MISC: 0 refills | Status: DC

## 2018-04-19 NOTE — Progress Notes (Signed)
Per Dr. Walden Field, patient needs prescription for mastectomy bras and prosthesis. Prescription printed and signed by MD. Virgel Gess to patients assisted living facility.

## 2018-04-19 NOTE — Patient Instructions (Signed)
[place] at [facility] Discharge Instructions  You saw Dr. Walden Field Today   Thank you for choosing [place] at [facility] to provide your oncology and hematology care.  To afford each patient quality time with our provider, please arrive at least 15 minutes before your scheduled appointment time.   If you have a lab appointment with the [facility] please come in thru the  Main Entrance and check in at the main information desk  You need to re-schedule your appointment should you arrive 10 or more minutes late.  We strive to give you quality time with our providers, and arriving late affects you and other patients whose appointments are after yours.  Also, if you no show three or more times for appointments you may be dismissed from the clinic at the providers discretion.     Again, thank you for choosing [facility].  Our hope is that these requests will decrease the amount of time that you wait before being seen by our physicians.       _____________________________________________________________  Should you have questions after your visit to [facility], please contact our office at [phone] between the hours of 8:30 a.m. and 4:30 p.m.  Voicemails left after 4:30 p.m. will not be returned until the following business day.  For prescription refill requests, have your pharmacy contact our office.       Resources For Cancer Patients and their Caregivers ? American Cancer Society: Can assist with transportation, wigs, general needs, runs Look Good Feel Better.        (631)704-7586 ? Cancer Care: Provides financial assistance, online support groups, medication/co-pay assistance.  1-800-813-HOPE 563-092-5337) ? [facility] Assists [place] cancer patients and their families through emotional , educational and financial support.  319 521 9719 ? [place] DSS Where to apply for food  stamps, Medicaid and utility assistance. 608-880-4188 ? RCATS: Transportation to medical appointments. (972) 293-8684 ? Social Security Administration: May apply for disability if have a Stage IV cancer. 770-354-4055 (606)246-6586 ? [employer], Disability and Transit Services: Assists with nutrition, care and transit needs. [place] Support Programs:   > Cancer Support Group  2nd Tuesday of the month 1pm-2pm, Journey Room   > Creative Journey  3rd Tuesday of the month 1130am-1pm, Journey Room

## 2018-04-19 NOTE — Progress Notes (Signed)
Diagnosis Invasive ductal carcinoma of breast, right ([place]) - Plan: CBC with Differential/Platelet, Comprehensive metabolic panel, Lactate dehydrogenase  Staging Cancer Staging Invasive ductal carcinoma of breast ([place]) Staging form: Breast, AJCC 6th Edition - Clinical: Stage IIIB - Signed by Baird Cancer, PA on 03/23/2012   Assessment and Plan:  1.  Stage IIIB invasive ductal carcinoma of right breast, ER-/PR-/HER2+.  Pt was diagnosed in 04/2003. Treated with right mastectomy, followed by Adriamycin/Cytoxan x 6 cycles, followed by Taxotere x 4 cycles, then Herceptin x 48 weeks. She no longer gets mammograms. She is requesting mastectomy bras and script will be sent.  She will RTC in 1 year for follow-up and labs.  She is advised to notify the office if any problems prior to her next visit.    2.  HTN.  BP is 169/66.  Follow-up with PCP.    3.  CKD.  Pt has recent labs done 11/2017 that showed Cr of 0.88.    4.  PAC.  Remains in place.  She should continue port flushes as recommended.    Current Status:  Pt is seen today for follow-up.  She is requesting mastectomy bras.    Problem List Patient Active Problem List   Diagnosis Date Noted  . CKD (chronic kidney disease) stage 3, GFR 30-59 ml/min (HCC) [N18.3] 12/07/2017  . Generalized weakness [R53.1] 12/07/2017  . Edema of right lower extremity [R60.0] 12/06/2017  . Alzheimer's dementia [G30.9, F02.80] 12/06/2017  . Incontinence in female [R32] 01/26/2017  . Knee pain, bilateral [M25.561, M25.562] 12/16/2016  . Need for assistance due to unsteady gait [R26.89] 12/16/2016  . Recurrent falls while walking [R29.6] 12/16/2016  . Memory loss [R41.3] 06/02/2015  . Personal history of noncompliance with medical treatment, presenting hazards to health [Z91.19] 09/27/2013  . Genital herpes [A60.00] 05/02/2008  . CAROTID ARTERY STENOSIS, LEFT [I65.29] 05/02/2008  . SYNCOPE, VASOVAGAL [R55] 05/02/2008  . CARDIAC MURMUR [R01.1] 05/02/2008  .  Anemia [D64.9] 10/03/2007  . Hyperlipidemia LDL goal <100 [E78.5] 09/27/2006  . Essential hypertension [I10] 09/27/2006  . Allergic rhinitis [J30.9] 09/27/2006  . Osteoarthritis [M19.90] 09/27/2006  . Invasive ductal carcinoma of breast ([place]) [C50.919] 09/27/2006    Past Medical History Past Medical History:  Diagnosis Date  . Allergic rhinitis   . Alzheimer's dementia   . Cancer ([place]) 03/2003   radiation and chemo ND mrm  . History of breast cancer   . History of chemotherapy    And radiation secondary to breast cancer  . Hyperlipidemia   . Hypertension   . IGT (impaired glucose tolerance) 2014   DIET MANAGED  . Normocytic anemia   . Osteoarthritis   . Port catheter in place 03/05/2013    Past Surgical History Past Surgical History:  Procedure Laterality Date  . ABDOMINAL HYSTERECTOMY  1970   Fibroid tumors, benign  . BREAST SURGERY Right 05/06/2003   MRM  . MASTECTOMY  2004   Right  . PORTACATH PLACEMENT      Family History Family History  Problem Relation Age of Onset  . Stroke Mother   . Heart attack Father   . Coronary artery disease Father   . Coronary artery disease Brother   . Heart attack Sister   . Heart disease Sister   . Arthritis Sister   . Heart disease Sister   . Dementia Sister   . Lung cancer Brother   . Prostate cancer Brother      Social History  reports that she has never  smoked. She has never used smokeless tobacco. She reports that she does not drink alcohol or use drugs.  Medications  Current Outpatient Medications:  .  acetaminophen (TYLENOL) 500 MG tablet, One tablet twice daily as needed for knee pain (Patient taking differently: Take 500 mg by mouth every 8 (eight) hours as needed for mild pain or moderate pain. ), Disp: 60 tablet, Rfl: 2 .  acyclovir (ZOVIRAX) 400 MG tablet, Take 400 mg by mouth 3 (three) times daily. X 5 days, Disp: , Rfl: 0 .  amLODipine (NORVASC) 10 MG tablet, Take 1 tablet (10 mg total) by mouth daily.,  Disp: 90 tablet, Rfl: 3 .  aspirin 81 MG tablet, Take 81 mg by mouth daily. , Disp: , Rfl:  .  atorvastatin (LIPITOR) 40 MG tablet, Take 1 tablet (40 mg total) by mouth daily., Disp: 30 tablet, Rfl: 0 .  cefdinir (OMNICEF) 300 MG capsule, Take 1 capsule (300 mg total) by mouth 2 (two) times daily., Disp: 7 capsule, Rfl: 0 .  docusate sodium (COLACE) 100 MG capsule, Take 100 mg by mouth daily as needed for mild constipation., Disp: , Rfl:  .  donepezil (ARICEPT) 10 MG tablet, Take 1 tablet (10 mg total) by mouth daily., Disp: 30 tablet, Rfl: 0 .  feeding supplement, ENSURE COMPLETE, (ENSURE COMPLETE) LIQD, Take 237 mLs by mouth 2 (two) times daily between meals., Disp: 60 Bottle, Rfl: 11 .  memantine (NAMENDA) 5 MG tablet, Take 1 tablet (5 mg total) by mouth 2 (two) times daily., Disp: 60 tablet, Rfl: 0 .  Multiple Vitamin (MULTIVITAMIN WITH MINERALS) TABS tablet, Take 1 tablet by mouth daily., Disp: , Rfl:  .  olmesartan (BENICAR) 40 MG tablet, Take 1 tablet (40 mg total) by mouth daily., Disp: 30 tablet, Rfl: 3 .  UNABLE TO FIND, Prevail Pads BC013 11" 6 bags/96 pads per month Dx: urinary incontinence, Disp: 96 each, Rfl: 11 .  UNABLE TO FIND, Wheelchair x 1 Dx R29.6, M25.561, Disp: 1 each, Rfl: 0 .  Vitamin D, Ergocalciferol, (DRISDOL) 50000 units CAPS capsule, Take 1 capsule (50,000 Units total) by mouth every 7 (seven) days., Disp: 4 capsule, Rfl: 0 .  zolpidem (AMBIEN) 5 MG tablet, Take 1 tablet (5 mg total) by mouth at bedtime., Disp: 7 tablet, Rfl: 0 No current facility-administered medications for this visit.   Facility-Administered Medications Ordered in Other Visits:  .  sodium chloride 0.9 % injection 10 mL, 10 mL, Intravenous, PRN, Baird Cancer, PA-C, 10 mL at 03/23/12 0919  Allergies Aricept [donepezil hcl]  Review of Systems Review of Systems - Oncology ROS as per HPI otherwise 12 point ROS is negative.   Physical Exam  Vitals Wt Readings from Last 3 Encounters:   04/19/18 156 lb 11.2 oz (71.1 kg)  12/09/17 146 lb 2.6 oz (66.3 kg)  11/26/17 130 lb (59 kg)   Temp Readings from Last 3 Encounters:  04/19/18 98.4 F (36.9 C) (Oral)  02/19/18 97.9 F (36.6 C) (Oral)  12/21/17 98.3 F (36.8 C) (Oral)   BP Readings from Last 3 Encounters:  04/19/18 (!) 169/66  02/19/18 (!) 149/97  12/21/17 (!) 151/51   Pulse Readings from Last 3 Encounters:  04/19/18 81  02/19/18 72  12/21/17 67   Constitutional: Well-developed, well-nourished, and in no distress.   HENT: Head: Normocephalic and atraumatic.  Mouth/Throat: No oropharyngeal exudate. Mucosa moist. Eyes: Pupils are equal, round, and reactive to light. Conjunctivae are normal. No scleral icterus.  Neck: Normal range of  motion. Neck supple. No JVD present.  Cardiovascular: Normal rate, regular rhythm and normal heart sounds.  Exam reveals no gallop and no friction rub.   No murmur heard. Pulmonary/Chest: Effort normal and breath sounds normal. No respiratory distress. No wheezes.No rales.  Abdominal: Soft. Bowel sounds are normal. No distension. There is no tenderness. There is no guarding.  Musculoskeletal: No edema or tenderness.  Lymphadenopathy: No cervical, axillary or supraclavicular adenopathy.  Neurological: Alert and oriented to person, place, and time. No cranial nerve deficit.  Skin: Skin is warm and dry. No rash noted. No erythema. No pallor.  Psychiatric: Affect and judgment normal.  Bilateral breast exam.  Chaperone present.  Right mastectomy.  No palpable signs of chest wall recurrence.  Left breast shows no dominant masses  PAC noted near 1 O'clock position of left breast.    Labs No visits with results within 3 Day(s) from this visit.  Latest known visit with results is:  Admission on 12/06/2017, Discharged on 12/09/2017  Component Date Value Ref Range Status  . WBC 12/06/2017 8.3  4.0 - 10.5 K/uL Final  . RBC 12/06/2017 3.97  3.87 - 5.11 MIL/uL Final  . Hemoglobin 12/06/2017  10.3* 12.0 - 15.0 g/dL Final  . HCT 12/06/2017 32.9* 36.0 - 46.0 % Final  . MCV 12/06/2017 82.9  78.0 - 100.0 fL Final  . MCH 12/06/2017 25.9* 26.0 - 34.0 pg Final  . MCHC 12/06/2017 31.3  30.0 - 36.0 g/dL Final  . RDW 12/06/2017 14.7  11.5 - 15.5 % Final  . Platelets 12/06/2017 328  150 - 400 K/uL Final  . Neutrophils Relative % 12/06/2017 59  % Final  . Neutro Abs 12/06/2017 4.9  1.7 - 7.7 K/uL Final  . Lymphocytes Relative 12/06/2017 25  % Final  . Lymphs Abs 12/06/2017 2.1  0.7 - 4.0 K/uL Final  . Monocytes Relative 12/06/2017 9  % Final  . Monocytes Absolute 12/06/2017 0.8  0.1 - 1.0 K/uL Final  . Eosinophils Relative 12/06/2017 6  % Final  . Eosinophils Absolute 12/06/2017 0.5  0.0 - 0.7 K/uL Final  . Basophils Relative 12/06/2017 1  % Final  . Basophils Absolute 12/06/2017 0.1  0.0 - 0.1 K/uL Final  . Sodium 12/06/2017 138  135 - 145 mmol/L Final  . Potassium 12/06/2017 3.9  3.5 - 5.1 mmol/L Final  . Chloride 12/06/2017 101  101 - 111 mmol/L Final  . CO2 12/06/2017 27  22 - 32 mmol/L Final  . Glucose, Bld 12/06/2017 111* 65 - 99 mg/dL Final  . BUN 12/06/2017 20  6 - 20 mg/dL Final  . Creatinine, Ser 12/06/2017 1.33* 0.44 - 1.00 mg/dL Final  . Calcium 12/06/2017 9.3  8.9 - 10.3 mg/dL Final  . Total Protein 12/06/2017 7.4  6.5 - 8.1 g/dL Final  . Albumin 12/06/2017 3.6  3.5 - 5.0 g/dL Final  . AST 12/06/2017 37  15 - 41 U/L Final  . ALT 12/06/2017 29  14 - 54 U/L Final  . Alkaline Phosphatase 12/06/2017 69  38 - 126 U/L Final  . Total Bilirubin 12/06/2017 0.3  0.3 - 1.2 mg/dL Final  . GFR calc non Af Amer 12/06/2017 36* >60 mL/min Final  . GFR calc Af Amer 12/06/2017 42* >60 mL/min Final   Comment: (NOTE) The eGFR has been calculated using the CKD EPI equation. This calculation has not been validated in all clinical situations. eGFR's persistently <60 mL/min signify possible Chronic Kidney Disease.   Georgiann Hahn gap 12/06/2017  10  5 - 15 Final  . Troponin I 12/06/2017 <0.03   <0.03 ng/mL Final  . MRSA by PCR 12/07/2017 NEGATIVE  NEGATIVE Final   Comment:        The GeneXpert MRSA Assay (FDA approved for NASAL specimens only), is one component of a comprehensive MRSA colonization surveillance program. It is not intended to diagnose MRSA infection nor to guide or monitor treatment for MRSA infections.   . WBC 12/07/2017 8.0  4.0 - 10.5 K/uL Final  . RBC 12/07/2017 3.59* 3.87 - 5.11 MIL/uL Final  . Hemoglobin 12/07/2017 9.3* 12.0 - 15.0 g/dL Final  . HCT 12/07/2017 29.8* 36.0 - 46.0 % Final  . MCV 12/07/2017 83.0  78.0 - 100.0 fL Final  . MCH 12/07/2017 25.9* 26.0 - 34.0 pg Final  . MCHC 12/07/2017 31.2  30.0 - 36.0 g/dL Final  . RDW 12/07/2017 14.9  11.5 - 15.5 % Final  . Platelets 12/07/2017 310  150 - 400 K/uL Final  . Neutrophils Relative % 12/07/2017 56  % Final  . Neutro Abs 12/07/2017 4.5  1.7 - 7.7 K/uL Final  . Lymphocytes Relative 12/07/2017 27  % Final  . Lymphs Abs 12/07/2017 2.2  0.7 - 4.0 K/uL Final  . Monocytes Relative 12/07/2017 9  % Final  . Monocytes Absolute 12/07/2017 0.7  0.1 - 1.0 K/uL Final  . Eosinophils Relative 12/07/2017 7  % Final  . Eosinophils Absolute 12/07/2017 0.6  0.0 - 0.7 K/uL Final  . Basophils Relative 12/07/2017 1  % Final  . Basophils Absolute 12/07/2017 0.0  0.0 - 0.1 K/uL Final  . Sodium 12/07/2017 138  135 - 145 mmol/L Final  . Potassium 12/07/2017 4.0  3.5 - 5.1 mmol/L Final  . Chloride 12/07/2017 105  101 - 111 mmol/L Final  . CO2 12/07/2017 25  22 - 32 mmol/L Final  . Glucose, Bld 12/07/2017 88  65 - 99 mg/dL Final  . BUN 12/07/2017 17  6 - 20 mg/dL Final  . Creatinine, Ser 12/07/2017 1.10* 0.44 - 1.00 mg/dL Final  . Calcium 12/07/2017 9.0  8.9 - 10.3 mg/dL Final  . GFR calc non Af Amer 12/07/2017 45* >60 mL/min Final  . GFR calc Af Amer 12/07/2017 52* >60 mL/min Final   Comment: (NOTE) The eGFR has been calculated using the CKD EPI equation. This calculation has not been validated in all clinical  situations. eGFR's persistently <60 mL/min signify possible Chronic Kidney Disease.   . Anion gap 12/07/2017 8  5 - 15 Final  . Color, Urine 12/07/2017 YELLOW  YELLOW Final  . APPearance 12/07/2017 HAZY* CLEAR Final  . Specific Gravity, Urine 12/07/2017 1.017  1.005 - 1.030 Final  . pH 12/07/2017 6.0  5.0 - 8.0 Final  . Glucose, UA 12/07/2017 NEGATIVE  NEGATIVE mg/dL Final  . Hgb urine dipstick 12/07/2017 SMALL* NEGATIVE Final  . Bilirubin Urine 12/07/2017 NEGATIVE  NEGATIVE Final  . Ketones, ur 12/07/2017 NEGATIVE  NEGATIVE mg/dL Final  . Protein, ur 12/07/2017 NEGATIVE  NEGATIVE mg/dL Final  . Nitrite 12/07/2017 NEGATIVE  NEGATIVE Final  . Leukocytes, UA 12/07/2017 LARGE* NEGATIVE Final  . RBC / HPF 12/07/2017 6-30  0 - 5 RBC/hpf Final  . WBC, UA 12/07/2017 TOO NUMEROUS TO COUNT  0 - 5 WBC/hpf Final  . Bacteria, UA 12/07/2017 RARE* NONE SEEN Final  . Squamous Epithelial / LPF 12/07/2017 0-5* NONE SEEN Final  . WBC Clumps 12/07/2017 PRESENT   Final  . Specimen Description 12/07/2017 URINE, CLEAN  CATCH   Final  . Special Requests 12/07/2017 NONE   Final  . Culture 12/07/2017 *  Final                   Value:<10,000 COLONIES/mL INSIGNIFICANT GROWTH Performed at [facility] [address], [place], [place] [address]   . Report Status 12/07/2017 12/10/2017 FINAL   Final  . Vitamin B-12 12/07/2017 450  180 - 914 pg/mL Final   Comment: (NOTE) This assay is not validated for testing neonatal or myeloproliferative syndrome specimens for Vitamin B12 levels. Performed at [facility], [place] [address] [place], [place] [address]   . TSH 12/07/2017 1.334  0.350 - 4.500 uIU/mL Final   Performed by a 3rd Generation assay with a functional sensitivity of <=0.01 uIU/mL.  . Free T4 12/07/2017 1.00  0.61 - 1.12 ng/dL Final   Comment: (NOTE) Biotin ingestion may interfere with free T4 tests. If the results are inconsistent with the TSH level, previous test results, or the clinical  presentation, then consider biotin interference. If needed, order repeat testing after stopping biotin. Performed at [facility], [place] [address], [place], [place] [address]   . Sodium 12/09/2017 139  135 - 145 mmol/L Final  . Potassium 12/09/2017 3.8  3.5 - 5.1 mmol/L Final  . Chloride 12/09/2017 106  101 - 111 mmol/L Final  . CO2 12/09/2017 26  22 - 32 mmol/L Final  . Glucose, Bld 12/09/2017 88  65 - 99 mg/dL Final  . BUN 12/09/2017 15  6 - 20 mg/dL Final  . Creatinine, Ser 12/09/2017 0.88  0.44 - 1.00 mg/dL Final  . Calcium 12/09/2017 9.0  8.9 - 10.3 mg/dL Final  . GFR calc non Af Amer 12/09/2017 59* >60 mL/min Final  . GFR calc Af Amer 12/09/2017 >60  >60 mL/min Final   Comment: (NOTE) The eGFR has been calculated using the CKD EPI equation. This calculation has not been validated in all clinical situations. eGFR's persistently <60 mL/min signify possible Chronic Kidney Disease.   . Anion gap 12/09/2017 7  5 - 15 Final  . Total CK 12/09/2017 94  38 - 234 U/L Final     Pathology Orders Placed This Encounter  Procedures  . CBC with Differential/Platelet    Standing Status:   Future    Standing Expiration Date:   04/19/2020  . Comprehensive metabolic panel    Standing Status:   Future    Standing Expiration Date:   04/19/2020  . Lactate dehydrogenase    Standing Status:   Future    Standing Expiration Date:   04/19/2020       Zoila Shutter MD

## 2018-04-19 NOTE — Addendum Note (Signed)
Addended by: Jaynie Collins R on: 04/19/2018 04:23 PM   Modules accepted: Orders

## 2018-04-20 ENCOUNTER — Ambulatory Visit (HOSPITAL_COMMUNITY): Payer: Medicare Other | Admitting: Internal Medicine

## 2018-04-20 ENCOUNTER — Ambulatory Visit (HOSPITAL_COMMUNITY): Payer: Medicare Other | Admitting: Adult Health

## 2018-04-20 ENCOUNTER — Encounter (HOSPITAL_COMMUNITY): Payer: Medicare Other

## 2018-04-20 DIAGNOSIS — I872 Venous insufficiency (chronic) (peripheral): Secondary | ICD-10-CM | POA: Diagnosis not present

## 2018-04-20 DIAGNOSIS — N183 Chronic kidney disease, stage 3 (moderate): Secondary | ICD-10-CM | POA: Diagnosis not present

## 2018-04-20 DIAGNOSIS — G309 Alzheimer's disease, unspecified: Secondary | ICD-10-CM | POA: Diagnosis not present

## 2018-04-20 DIAGNOSIS — I129 Hypertensive chronic kidney disease with stage 1 through stage 4 chronic kidney disease, or unspecified chronic kidney disease: Secondary | ICD-10-CM | POA: Diagnosis not present

## 2018-04-20 DIAGNOSIS — F0280 Dementia in other diseases classified elsewhere without behavioral disturbance: Secondary | ICD-10-CM | POA: Diagnosis not present

## 2018-04-20 DIAGNOSIS — R2689 Other abnormalities of gait and mobility: Secondary | ICD-10-CM | POA: Diagnosis not present

## 2018-04-23 DIAGNOSIS — N183 Chronic kidney disease, stage 3 (moderate): Secondary | ICD-10-CM | POA: Diagnosis not present

## 2018-04-23 DIAGNOSIS — R2689 Other abnormalities of gait and mobility: Secondary | ICD-10-CM | POA: Diagnosis not present

## 2018-04-23 DIAGNOSIS — F0280 Dementia in other diseases classified elsewhere without behavioral disturbance: Secondary | ICD-10-CM | POA: Diagnosis not present

## 2018-04-23 DIAGNOSIS — I129 Hypertensive chronic kidney disease with stage 1 through stage 4 chronic kidney disease, or unspecified chronic kidney disease: Secondary | ICD-10-CM | POA: Diagnosis not present

## 2018-04-23 DIAGNOSIS — G309 Alzheimer's disease, unspecified: Secondary | ICD-10-CM | POA: Diagnosis not present

## 2018-04-23 DIAGNOSIS — I872 Venous insufficiency (chronic) (peripheral): Secondary | ICD-10-CM | POA: Diagnosis not present

## 2018-04-25 DIAGNOSIS — R2689 Other abnormalities of gait and mobility: Secondary | ICD-10-CM | POA: Diagnosis not present

## 2018-04-25 DIAGNOSIS — N183 Chronic kidney disease, stage 3 (moderate): Secondary | ICD-10-CM | POA: Diagnosis not present

## 2018-04-25 DIAGNOSIS — G309 Alzheimer's disease, unspecified: Secondary | ICD-10-CM | POA: Diagnosis not present

## 2018-04-25 DIAGNOSIS — I872 Venous insufficiency (chronic) (peripheral): Secondary | ICD-10-CM | POA: Diagnosis not present

## 2018-04-25 DIAGNOSIS — F0280 Dementia in other diseases classified elsewhere without behavioral disturbance: Secondary | ICD-10-CM | POA: Diagnosis not present

## 2018-04-25 DIAGNOSIS — I129 Hypertensive chronic kidney disease with stage 1 through stage 4 chronic kidney disease, or unspecified chronic kidney disease: Secondary | ICD-10-CM | POA: Diagnosis not present

## 2018-04-30 DIAGNOSIS — N183 Chronic kidney disease, stage 3 (moderate): Secondary | ICD-10-CM | POA: Diagnosis not present

## 2018-04-30 DIAGNOSIS — R2689 Other abnormalities of gait and mobility: Secondary | ICD-10-CM | POA: Diagnosis not present

## 2018-04-30 DIAGNOSIS — I129 Hypertensive chronic kidney disease with stage 1 through stage 4 chronic kidney disease, or unspecified chronic kidney disease: Secondary | ICD-10-CM | POA: Diagnosis not present

## 2018-04-30 DIAGNOSIS — F0280 Dementia in other diseases classified elsewhere without behavioral disturbance: Secondary | ICD-10-CM | POA: Diagnosis not present

## 2018-04-30 DIAGNOSIS — I872 Venous insufficiency (chronic) (peripheral): Secondary | ICD-10-CM | POA: Diagnosis not present

## 2018-04-30 DIAGNOSIS — G309 Alzheimer's disease, unspecified: Secondary | ICD-10-CM | POA: Diagnosis not present

## 2018-05-03 DIAGNOSIS — I872 Venous insufficiency (chronic) (peripheral): Secondary | ICD-10-CM | POA: Diagnosis not present

## 2018-05-03 DIAGNOSIS — F0280 Dementia in other diseases classified elsewhere without behavioral disturbance: Secondary | ICD-10-CM | POA: Diagnosis not present

## 2018-05-03 DIAGNOSIS — I129 Hypertensive chronic kidney disease with stage 1 through stage 4 chronic kidney disease, or unspecified chronic kidney disease: Secondary | ICD-10-CM | POA: Diagnosis not present

## 2018-05-03 DIAGNOSIS — G309 Alzheimer's disease, unspecified: Secondary | ICD-10-CM | POA: Diagnosis not present

## 2018-05-03 DIAGNOSIS — N183 Chronic kidney disease, stage 3 (moderate): Secondary | ICD-10-CM | POA: Diagnosis not present

## 2018-05-03 DIAGNOSIS — R2689 Other abnormalities of gait and mobility: Secondary | ICD-10-CM | POA: Diagnosis not present

## 2018-05-07 DIAGNOSIS — I872 Venous insufficiency (chronic) (peripheral): Secondary | ICD-10-CM | POA: Diagnosis not present

## 2018-05-07 DIAGNOSIS — G309 Alzheimer's disease, unspecified: Secondary | ICD-10-CM | POA: Diagnosis not present

## 2018-05-07 DIAGNOSIS — R2689 Other abnormalities of gait and mobility: Secondary | ICD-10-CM | POA: Diagnosis not present

## 2018-05-07 DIAGNOSIS — I129 Hypertensive chronic kidney disease with stage 1 through stage 4 chronic kidney disease, or unspecified chronic kidney disease: Secondary | ICD-10-CM | POA: Diagnosis not present

## 2018-05-07 DIAGNOSIS — F0280 Dementia in other diseases classified elsewhere without behavioral disturbance: Secondary | ICD-10-CM | POA: Diagnosis not present

## 2018-05-07 DIAGNOSIS — N183 Chronic kidney disease, stage 3 (moderate): Secondary | ICD-10-CM | POA: Diagnosis not present

## 2018-05-08 ENCOUNTER — Emergency Department (HOSPITAL_COMMUNITY)
Admission: EM | Admit: 2018-05-08 | Discharge: 2018-05-08 | Disposition: A | Discharge status: Home / Self Care | Payer: Medicare Other | Attending: Emergency Medicine | Admitting: Emergency Medicine

## 2018-05-08 ENCOUNTER — Emergency Department (HOSPITAL_COMMUNITY): Payer: Medicare Other

## 2018-05-08 ENCOUNTER — Encounter (HOSPITAL_COMMUNITY): Payer: Self-pay | Admitting: Emergency Medicine

## 2018-05-08 ENCOUNTER — Other Ambulatory Visit: Payer: Self-pay

## 2018-05-08 DIAGNOSIS — G309 Alzheimer's disease, unspecified: Secondary | ICD-10-CM | POA: Diagnosis not present

## 2018-05-08 DIAGNOSIS — I129 Hypertensive chronic kidney disease with stage 1 through stage 4 chronic kidney disease, or unspecified chronic kidney disease: Secondary | ICD-10-CM | POA: Insufficient documentation

## 2018-05-08 DIAGNOSIS — R55 Syncope and collapse: Secondary | ICD-10-CM | POA: Diagnosis not present

## 2018-05-08 DIAGNOSIS — Z79899 Other long term (current) drug therapy: Secondary | ICD-10-CM | POA: Insufficient documentation

## 2018-05-08 DIAGNOSIS — K5909 Other constipation: Secondary | ICD-10-CM | POA: Insufficient documentation

## 2018-05-08 DIAGNOSIS — R4182 Altered mental status, unspecified: Secondary | ICD-10-CM | POA: Diagnosis not present

## 2018-05-08 DIAGNOSIS — R194 Change in bowel habit: Secondary | ICD-10-CM | POA: Diagnosis not present

## 2018-05-08 DIAGNOSIS — R9431 Abnormal electrocardiogram [ECG] [EKG]: Secondary | ICD-10-CM | POA: Diagnosis not present

## 2018-05-08 DIAGNOSIS — I10 Essential (primary) hypertension: Secondary | ICD-10-CM | POA: Diagnosis not present

## 2018-05-08 DIAGNOSIS — N183 Chronic kidney disease, stage 3 (moderate): Secondary | ICD-10-CM | POA: Insufficient documentation

## 2018-05-08 DIAGNOSIS — R404 Transient alteration of awareness: Secondary | ICD-10-CM

## 2018-05-08 LAB — CBC WITH DIFFERENTIAL/PLATELET
BASOS ABS: 0.1 10*3/uL (ref 0.0–0.1)
Basophils Relative: 1 %
EOS ABS: 0.4 10*3/uL (ref 0.0–0.7)
EOS PCT: 4 %
HCT: 33 % — ABNORMAL LOW (ref 36.0–46.0)
HEMOGLOBIN: 10.1 g/dL — AB (ref 12.0–15.0)
LYMPHS ABS: 1.3 10*3/uL (ref 0.7–4.0)
Lymphocytes Relative: 13 %
MCH: 25.6 pg — AB (ref 26.0–34.0)
MCHC: 30.6 g/dL (ref 30.0–36.0)
MCV: 83.5 fL (ref 78.0–100.0)
Monocytes Absolute: 0.7 10*3/uL (ref 0.1–1.0)
Monocytes Relative: 8 %
NEUTROS PCT: 74 %
Neutro Abs: 7.1 10*3/uL (ref 1.7–7.7)
PLATELETS: 269 10*3/uL (ref 150–400)
RBC: 3.95 MIL/uL (ref 3.87–5.11)
RDW: 14.4 % (ref 11.5–15.5)
WBC: 9.6 10*3/uL (ref 4.0–10.5)

## 2018-05-08 LAB — COMPREHENSIVE METABOLIC PANEL
ALBUMIN: 3.5 g/dL (ref 3.5–5.0)
ALT: 18 U/L (ref 14–54)
AST: 23 U/L (ref 15–41)
Alkaline Phosphatase: 70 U/L (ref 38–126)
Anion gap: 7 (ref 5–15)
BUN: 25 mg/dL — AB (ref 6–20)
CO2: 26 mmol/L (ref 22–32)
CREATININE: 1.07 mg/dL — AB (ref 0.44–1.00)
Calcium: 9.2 mg/dL (ref 8.9–10.3)
Chloride: 104 mmol/L (ref 101–111)
GFR calc non Af Amer: 46 mL/min — ABNORMAL LOW (ref >60)
GFR, EST AFRICAN AMERICAN: 54 mL/min — AB (ref >60)
GLUCOSE: 95 mg/dL (ref 65–99)
Potassium: 4 mmol/L (ref 3.5–5.1)
SODIUM: 137 mmol/L (ref 135–145)
Total Bilirubin: 0.4 mg/dL (ref 0.3–1.2)
Total Protein: 7.7 g/dL (ref 6.5–8.1)

## 2018-05-08 MED ORDER — POLYETHYLENE GLYCOL 3350 17 G PO PACK: 17.0000 g | PACK | Freq: Every day | ORAL | 0 refills | Status: AC

## 2018-05-08 NOTE — ED Notes (Signed)
Lab at bedside

## 2018-05-08 NOTE — ED Provider Notes (Signed)
[facility] EMERGENCY DEPARTMENT Provider Note   CSN: [id number] Arrival date & time: 05/08/18  0453     History   Chief Complaint Chief Complaint  Patient presents with  . Loss of Consciousness    HPI Emma Dawson is a 84 y.o. female.  HPI  This is an 84 year old female with a history of breast cancer, hypertension, hyperlipidemia who presents with reported loss of consciousness.  Per EMS, they were called out for loss of consciousness.  Patient's living facility states that they found her unconscious in her wheelchair.  Patient adamantly denies this.  She states "I was awake the whole time."  She states she was getting ready to get some coffee.  She has been consistent with the story.  She reports that she woke up at 3:30 AM.  She has no physical complaints.  She does report constipation did not have a bowel movement in 2 to 3 days.  She denies any chest pain, abdominal pain, nausea, vomiting, headache.  Her nephew is at the bedside.  Patient is alert and oriented able to provide history.  Past Medical History:  Diagnosis Date  . Allergic rhinitis   . Alzheimer's dementia   . Cancer ([place]) 03/2003   radiation and chemo ND [facility]rm  . History of breast cancer   . History of chemotherapy    And radiation secondary to breast cancer  . Hyperlipidemia   . Hypertension   . IGT (impaired glucose tolerance) 2014   DIET MANAGED  . Normocytic anemia   . Osteoarthritis   . Port catheter in place 03/05/2013    Patient Active Problem List   Diagnosis Date Noted  . CKD (chronic kidney disease) stage 3, GFR 30-59 ml/min (HCC) 12/07/2017  . Generalized weakness 12/07/2017  . Edema of right lower extremity 12/06/2017  . Alzheimer's dementia 12/06/2017  . Incontinence in female 01/26/2017  . Knee pain, bilateral 12/16/2016  . Need for assistance due to unsteady gait 12/16/2016  . Recurrent falls while walking 12/16/2016  . Memory loss 06/02/2015  . Personal history of noncompliance with  medical treatment, presenting hazards to health 09/27/2013  . Genital herpes 05/02/2008  . CAROTID ARTERY STENOSIS, LEFT 05/02/2008  . SYNCOPE, VASOVAGAL 05/02/2008  . CARDIAC MURMUR 05/02/2008  . Anemia 10/03/2007  . Hyperlipidemia LDL goal <100 09/27/2006  . Essential hypertension 09/27/2006  . Allergic rhinitis 09/27/2006  . Osteoarthritis 09/27/2006  . Invasive ductal carcinoma of breast ([place]) 09/27/2006    Past Surgical History:  Procedure Laterality Date  . ABDOMINAL HYSTERECTOMY  1970   Fibroid tumors, benign  . BREAST SURGERY Right 05/06/2003   MRM  . MASTECTOMY  2004   Right  . PORTACATH PLACEMENT       OB History   None      Home Medications    Prior to Admission medications   Medication Sig Start Date End Date Taking? Authorizing Provider  acetaminophen (TYLENOL) 500 MG tablet One tablet twice daily as needed for knee pain Patient taking differently: Take 500 mg by mouth every 8 (eight) hours as needed for mild pain or moderate pain.  05/08/12   Fayrene Helper, MD  acyclovir (ZOVIRAX) 400 MG tablet Take 400 mg by mouth 3 (three) times daily. X 5 days 12/19/17   [provider]  amLODipine (NORVASC) 10 MG tablet Take 1 tablet (10 mg total) by mouth daily. 12/26/17   Fayrene Helper, MD  aspirin 81 MG tablet Take 81 mg by mouth daily.  [provider]  atorvastatin (LIPITOR) 40 MG tablet Take 1 tablet (40 mg total) by mouth daily. 12/09/17   Orson Eva, MD  cefdinir (OMNICEF) 300 MG capsule Take 1 capsule (300 mg total) by mouth 2 (two) times daily. 12/09/17   Orson Eva, MD  docusate sodium (COLACE) 100 MG capsule Take 100 mg by mouth daily as needed for mild constipation.    [provider]  donepezil (ARICEPT) 10 MG tablet Take 1 tablet (10 mg total) by mouth daily. 12/09/17   Orson Eva, MD  feeding supplement, ENSURE COMPLETE, (ENSURE COMPLETE) LIQD Take 237 mLs by mouth 2 (two) times daily between meals. 01/25/17   Fayrene Helper, MD  memantine (NAMENDA) 5 MG tablet Take 1 tablet (5 mg total) by mouth 2 (two) times daily. 12/09/17   Orson Eva, MD  Misc. Devices MISC Please provide patient with mastectomy bras and prosthesis. She has had a right mastectomy. She is african-american. 04/19/18   Higgs, Mathis Dad, MD  Multiple Vitamin (MULTIVITAMIN WITH MINERALS) TABS tablet Take 1 tablet by mouth daily.    [provider]  olmesartan (BENICAR) 40 MG tablet Take 1 tablet (40 mg total) by mouth daily. 12/28/17   Fayrene Helper, MD  polyethylene glycol [facility]) packet Take 17 g by mouth daily. 05/08/18   Merryl Hacker, MD  UNABLE TO FIND Prevail Pads [phone] 11" 6 bags/96 pads per month Dx: urinary incontinence 06/29/17   Fayrene Helper, MD  UNABLE TO FIND Wheelchair x 1 Dx R29.6, C16.606 12/29/17   Fayrene Helper, MD  Vitamin D, Ergocalciferol, (DRISDOL) 50000 units CAPS capsule Take 1 capsule (50,000 Units total) by mouth every 7 (seven) days. 12/14/17   Orson Eva, MD  zolpidem (AMBIEN) 5 MG tablet Take 1 tablet (5 mg total) by mouth at bedtime. 12/09/17   Orson Eva, MD    Family History Family History  Problem Relation Age of Onset  . Stroke Mother   . Heart attack Father   . Coronary artery disease Father   . Coronary artery disease Brother   . Heart attack Sister   . Heart disease Sister   . Arthritis Sister   . Heart disease Sister   . Dementia Sister   . Lung cancer Brother   . Prostate cancer Brother     Social History Social History   Tobacco Use  . Smoking status: Never Smoker  . Smokeless tobacco: Never Used  Substance Use Topics  . Alcohol use: No  . Drug use: No     Allergies   Aricept [donepezil hcl]   Review of Systems Review of Systems  Constitutional: Negative for fever.  Respiratory: Negative for shortness of breath.   Cardiovascular: Negative for chest pain.  Gastrointestinal: Positive for constipation. Negative for abdominal pain, diarrhea and  vomiting.  Neurological: Negative for weakness, light-headedness and headaches.       Reported syncope  All other systems reviewed and are negative.    Physical Exam Updated Vital Signs BP (!) 155/65 (BP Location: Left Arm)   Pulse 83   Temp 98 F (36.7 C) (Oral)   Resp 17   SpO2 100%   Physical Exam  Constitutional: She is oriented to person, place, and time. She appears well-developed and well-nourished. No distress.  HENT:  Head: Normocephalic and atraumatic.  Mouth/Throat: Oropharynx is clear and moist.  Eyes: Pupils are equal, round, and reactive to light.  2 mm reactive bilaterally  Neck: Normal range of motion.  Neck supple.  Cardiovascular: Normal rate, regular rhythm and normal heart sounds.  Pulmonary/Chest: Effort normal and breath sounds normal. No respiratory distress. She has no wheezes.  Abdominal: Soft. Bowel sounds are normal.  Neurological: She is alert and oriented to person, place, and time.  Cranial nerves II through XII intact, 5 out of 5 strength in all 4 extremities, no dysmetria to finger-nose-finger  Skin: Skin is warm and dry.  Psychiatric: She has a normal mood and affect.  Nursing note and vitals reviewed.    ED Treatments / Results  Labs (all labs ordered are listed, but only abnormal results are displayed) Labs Reviewed  CBC WITH DIFFERENTIAL/PLATELET - Abnormal; Notable for the following components:      Result Value   Hemoglobin 10.1 (*)    HCT 33.0 (*)    MCH 25.6 (*)    All other components within normal limits  COMPREHENSIVE METABOLIC PANEL - Abnormal; Notable for the following components:   BUN 25 (*)    Creatinine, Ser 1.07 (*)    GFR calc non Af Amer 46 (*)    GFR calc Af Amer 54 (*)    All other components within normal limits    EKG EKG Interpretation  Date/Time:  Tuesday May 08 2018 04:58:53 EDT Ventricular Rate:  62 PR Interval:    QRS Duration: 107 QT Interval:  437 QTC Calculation: 444 R Axis:   35 Text  Interpretation:  NSR  First degree A-V block Abnormal R-wave progression, early transition No significant change since last tracing Confirmed by Thayer Jew [phone]) on 05/08/2018 5:03:39 AM   Radiology Dg Abdomen Acute W/chest  Result Date: 05/08/2018 CLINICAL DATA:  84 year old hypertensive female without bowel movement in 4 days. History of breast cancer post surgery. No abdominal pain. Initial encounter. EXAM: DG ABDOMEN ACUTE W/ 1V CHEST COMPARISON:  12/06/2017, 01/23/2017 and 04/10/2010 chest x-ray. FINDINGS: Left central line tip proximal superior vena cava level. Post breast surgery and axillary lymph node dissection bilaterally. Calcifications left breast region. Scarring right lung apex stable. Cardiomegaly. Central pulmonary vascular prominence without pulmonary edema. No segmental consolidation or pneumothorax. Calcified minimally tortuous aorta. Gas distended right colon and proximal to mid transverse colon measuring up to 7.9 cm. Moderate stool distal transverse colon and proximal descending colon. No gas distended small bowel loops or free intraperitoneal air. No acute osseous abnormality. IMPRESSION: Gas distended right colon and proximal to mid transverse colon measuring up to 7.9 cm. Moderate stool distal transverse colon and proximal descending colon. No gas distended small bowel loops or free intraperitoneal air. Cardiomegaly. Aortic Atherosclerosis (ICD10-I70.0). No acute pulmonary abnormality. Electronically Signed   By: Genia Del M.D.   On: 05/08/2018 06:39    Procedures Procedures (including critical care time)  Medications Ordered in ED Medications - No data to display   Initial Impression / Assessment and Plan / ED Course  I have reviewed the triage vital signs and the nursing notes.  Pertinent labs & imaging results that were available during my care of the patient were reviewed by me and considered in my medical decision making (see chart for details).      Patient presents with reported altered level of consciousness.  She is awake, alert, oriented.  She does have history of dementia.  However, she is adamant that she was conscious.  Neurologic exam is reassuring.  Vital signs largely unremarkable.  Her only complaint is constipation.  She denies any abdominal pain.  Abdominal exam is benign.  She  is good bowel sounds.  Screening EKG shows no evidence of arrhythmia.  Basic lab work shows no evidence of anemia or significant metabolic derangement.  Acute abdominal series was obtained to assess for stool burden.  She does have moderate stool in the transverse and proximal descending colon.  She also has gas distention of the right colon.  No air-fluid levels to suggest bowel obstruction.  Patient was unable to provide a urine sample while in the emergency department.  Do not feel this is necessary for full evaluation.  Recommend taking MiraLAX once daily.  Patient's family is at bedside and reports that this is happened before and her work-up has been reassuring.  His questions were answered and she will be discharged back to her living facility.  After history, exam, and medical workup I feel the patient has been appropriately medically screened and is safe for discharge home. Pertinent diagnoses were discussed with the patient. Patient was given return precautions.   Final Clinical Impressions(s) / ED Diagnoses   Final diagnoses:  Altered level of consciousness  Other constipation    ED Discharge Orders        Ordered    polyethylene glycol (MIRALAX) packet  Daily     05/08/18 0656       Merryl Hacker, MD 05/08/18 2303

## 2018-05-08 NOTE — ED Triage Notes (Signed)
Pt comes from [place] states pt was unconscious in her wheelchair. When EMS arrived pt was AxO X 4. Pt has no complaints at this time.

## 2018-05-08 NOTE — Discharge Instructions (Signed)
You were seen today for reported altered level of consciousness.  Your work-up was reassuring.  Regarding your constipation, add MiraLAX daily.  Follow-up with your primary physician.

## 2018-05-09 DIAGNOSIS — G301 Alzheimer's disease with late onset: Secondary | ICD-10-CM | POA: Diagnosis not present

## 2018-05-09 DIAGNOSIS — I10 Essential (primary) hypertension: Secondary | ICD-10-CM | POA: Diagnosis not present

## 2018-05-09 DIAGNOSIS — G4701 Insomnia due to medical condition: Secondary | ICD-10-CM | POA: Diagnosis not present

## 2018-05-09 DIAGNOSIS — Z79899 Other long term (current) drug therapy: Secondary | ICD-10-CM | POA: Diagnosis not present

## 2018-05-10 DIAGNOSIS — L84 Corns and callosities: Secondary | ICD-10-CM | POA: Diagnosis not present

## 2018-05-10 DIAGNOSIS — B351 Tinea unguium: Secondary | ICD-10-CM | POA: Diagnosis not present

## 2018-05-10 DIAGNOSIS — M79675 Pain in left toe(s): Secondary | ICD-10-CM | POA: Diagnosis not present

## 2018-05-10 DIAGNOSIS — N189 Chronic kidney disease, unspecified: Secondary | ICD-10-CM | POA: Diagnosis not present

## 2018-05-10 DIAGNOSIS — M2012 Hallux valgus (acquired), left foot: Secondary | ICD-10-CM | POA: Diagnosis not present

## 2018-05-11 DIAGNOSIS — R2689 Other abnormalities of gait and mobility: Secondary | ICD-10-CM | POA: Diagnosis not present

## 2018-05-11 DIAGNOSIS — G309 Alzheimer's disease, unspecified: Secondary | ICD-10-CM | POA: Diagnosis not present

## 2018-05-11 DIAGNOSIS — I129 Hypertensive chronic kidney disease with stage 1 through stage 4 chronic kidney disease, or unspecified chronic kidney disease: Secondary | ICD-10-CM | POA: Diagnosis not present

## 2018-05-11 DIAGNOSIS — I872 Venous insufficiency (chronic) (peripheral): Secondary | ICD-10-CM | POA: Diagnosis not present

## 2018-05-11 DIAGNOSIS — F0280 Dementia in other diseases classified elsewhere without behavioral disturbance: Secondary | ICD-10-CM | POA: Diagnosis not present

## 2018-05-11 DIAGNOSIS — N183 Chronic kidney disease, stage 3 (moderate): Secondary | ICD-10-CM | POA: Diagnosis not present

## 2018-05-14 DIAGNOSIS — N183 Chronic kidney disease, stage 3 (moderate): Secondary | ICD-10-CM | POA: Diagnosis not present

## 2018-05-14 DIAGNOSIS — G309 Alzheimer's disease, unspecified: Secondary | ICD-10-CM | POA: Diagnosis not present

## 2018-05-14 DIAGNOSIS — R2689 Other abnormalities of gait and mobility: Secondary | ICD-10-CM | POA: Diagnosis not present

## 2018-05-14 DIAGNOSIS — F0280 Dementia in other diseases classified elsewhere without behavioral disturbance: Secondary | ICD-10-CM | POA: Diagnosis not present

## 2018-05-14 DIAGNOSIS — I872 Venous insufficiency (chronic) (peripheral): Secondary | ICD-10-CM | POA: Diagnosis not present

## 2018-05-14 DIAGNOSIS — I129 Hypertensive chronic kidney disease with stage 1 through stage 4 chronic kidney disease, or unspecified chronic kidney disease: Secondary | ICD-10-CM | POA: Diagnosis not present

## 2018-05-17 DIAGNOSIS — F0390 Unspecified dementia without behavioral disturbance: Secondary | ICD-10-CM | POA: Diagnosis not present

## 2018-05-17 DIAGNOSIS — F419 Anxiety disorder, unspecified: Secondary | ICD-10-CM | POA: Diagnosis not present

## 2018-05-18 DIAGNOSIS — G309 Alzheimer's disease, unspecified: Secondary | ICD-10-CM | POA: Diagnosis not present

## 2018-05-18 DIAGNOSIS — I129 Hypertensive chronic kidney disease with stage 1 through stage 4 chronic kidney disease, or unspecified chronic kidney disease: Secondary | ICD-10-CM | POA: Diagnosis not present

## 2018-05-18 DIAGNOSIS — N183 Chronic kidney disease, stage 3 (moderate): Secondary | ICD-10-CM | POA: Diagnosis not present

## 2018-05-18 DIAGNOSIS — R2689 Other abnormalities of gait and mobility: Secondary | ICD-10-CM | POA: Diagnosis not present

## 2018-05-18 DIAGNOSIS — F0280 Dementia in other diseases classified elsewhere without behavioral disturbance: Secondary | ICD-10-CM | POA: Diagnosis not present

## 2018-05-18 DIAGNOSIS — I872 Venous insufficiency (chronic) (peripheral): Secondary | ICD-10-CM | POA: Diagnosis not present

## 2018-05-19 DIAGNOSIS — F0390 Unspecified dementia without behavioral disturbance: Secondary | ICD-10-CM | POA: Diagnosis not present

## 2018-05-19 DIAGNOSIS — B009 Herpesviral infection, unspecified: Secondary | ICD-10-CM | POA: Diagnosis not present

## 2018-05-19 DIAGNOSIS — I10 Essential (primary) hypertension: Secondary | ICD-10-CM | POA: Diagnosis not present

## 2018-05-19 DIAGNOSIS — N183 Chronic kidney disease, stage 3 (moderate): Secondary | ICD-10-CM | POA: Diagnosis not present

## 2018-05-21 DIAGNOSIS — I129 Hypertensive chronic kidney disease with stage 1 through stage 4 chronic kidney disease, or unspecified chronic kidney disease: Secondary | ICD-10-CM | POA: Diagnosis not present

## 2018-05-21 DIAGNOSIS — G309 Alzheimer's disease, unspecified: Secondary | ICD-10-CM | POA: Diagnosis not present

## 2018-05-21 DIAGNOSIS — R2689 Other abnormalities of gait and mobility: Secondary | ICD-10-CM | POA: Diagnosis not present

## 2018-05-21 DIAGNOSIS — I872 Venous insufficiency (chronic) (peripheral): Secondary | ICD-10-CM | POA: Diagnosis not present

## 2018-05-21 DIAGNOSIS — F0280 Dementia in other diseases classified elsewhere without behavioral disturbance: Secondary | ICD-10-CM | POA: Diagnosis not present

## 2018-05-21 DIAGNOSIS — N183 Chronic kidney disease, stage 3 (moderate): Secondary | ICD-10-CM | POA: Diagnosis not present

## 2018-05-25 DIAGNOSIS — G309 Alzheimer's disease, unspecified: Secondary | ICD-10-CM | POA: Diagnosis not present

## 2018-05-25 DIAGNOSIS — R2689 Other abnormalities of gait and mobility: Secondary | ICD-10-CM | POA: Diagnosis not present

## 2018-05-25 DIAGNOSIS — I129 Hypertensive chronic kidney disease with stage 1 through stage 4 chronic kidney disease, or unspecified chronic kidney disease: Secondary | ICD-10-CM | POA: Diagnosis not present

## 2018-05-25 DIAGNOSIS — F0280 Dementia in other diseases classified elsewhere without behavioral disturbance: Secondary | ICD-10-CM | POA: Diagnosis not present

## 2018-05-25 DIAGNOSIS — I872 Venous insufficiency (chronic) (peripheral): Secondary | ICD-10-CM | POA: Diagnosis not present

## 2018-05-25 DIAGNOSIS — N183 Chronic kidney disease, stage 3 (moderate): Secondary | ICD-10-CM | POA: Diagnosis not present

## 2018-05-28 ENCOUNTER — Encounter (HOSPITAL_COMMUNITY): Payer: Self-pay

## 2018-05-28 ENCOUNTER — Other Ambulatory Visit: Payer: Self-pay

## 2018-05-28 ENCOUNTER — Inpatient Hospital Stay (HOSPITAL_COMMUNITY): Payer: Medicare Other | Attending: Internal Medicine

## 2018-05-28 DIAGNOSIS — N183 Chronic kidney disease, stage 3 (moderate): Secondary | ICD-10-CM | POA: Diagnosis not present

## 2018-05-28 DIAGNOSIS — I872 Venous insufficiency (chronic) (peripheral): Secondary | ICD-10-CM | POA: Diagnosis not present

## 2018-05-28 DIAGNOSIS — Z853 Personal history of malignant neoplasm of breast: Secondary | ICD-10-CM | POA: Insufficient documentation

## 2018-05-28 DIAGNOSIS — Z452 Encounter for adjustment and management of vascular access device: Secondary | ICD-10-CM | POA: Insufficient documentation

## 2018-05-28 DIAGNOSIS — F0280 Dementia in other diseases classified elsewhere without behavioral disturbance: Secondary | ICD-10-CM | POA: Diagnosis not present

## 2018-05-28 DIAGNOSIS — I129 Hypertensive chronic kidney disease with stage 1 through stage 4 chronic kidney disease, or unspecified chronic kidney disease: Secondary | ICD-10-CM | POA: Diagnosis not present

## 2018-05-28 DIAGNOSIS — G309 Alzheimer's disease, unspecified: Secondary | ICD-10-CM | POA: Diagnosis not present

## 2018-05-28 DIAGNOSIS — R2689 Other abnormalities of gait and mobility: Secondary | ICD-10-CM | POA: Diagnosis not present

## 2018-05-28 MED ORDER — HEPARIN SOD (PORK) LOCK FLUSH 100 UNIT/ML IV SOLN
500.0000 [IU] | Freq: Once | INTRAVENOUS | Status: AC
  Administered 2018-05-28: 500 [IU] via INTRAVENOUS

## 2018-05-28 MED ORDER — SODIUM CHLORIDE 0.9% FLUSH
10.0000 mL | INTRAVENOUS | Status: DC | PRN
  Administered 2018-05-28: 10 mL via INTRAVENOUS
  Filled 2018-05-28: qty 10

## 2018-05-28 NOTE — Progress Notes (Signed)
Emma Dawson presented for Portacath access and flush.  Portacath located left chest wall accessed with  H 20 needle.  No blood return - flushes w/o difficulty with no s/s infiltration at site. Portacath flushed with 72ml NS and 500U/17ml Heparin and needle removed intact.  Procedure tolerated well and without incident.  Discharged via wheelchair in c/o assisted living facility staff.

## 2018-06-01 DIAGNOSIS — I872 Venous insufficiency (chronic) (peripheral): Secondary | ICD-10-CM | POA: Diagnosis not present

## 2018-06-01 DIAGNOSIS — N183 Chronic kidney disease, stage 3 (moderate): Secondary | ICD-10-CM | POA: Diagnosis not present

## 2018-06-01 DIAGNOSIS — F0280 Dementia in other diseases classified elsewhere without behavioral disturbance: Secondary | ICD-10-CM | POA: Diagnosis not present

## 2018-06-01 DIAGNOSIS — R2689 Other abnormalities of gait and mobility: Secondary | ICD-10-CM | POA: Diagnosis not present

## 2018-06-01 DIAGNOSIS — I129 Hypertensive chronic kidney disease with stage 1 through stage 4 chronic kidney disease, or unspecified chronic kidney disease: Secondary | ICD-10-CM | POA: Diagnosis not present

## 2018-06-01 DIAGNOSIS — G309 Alzheimer's disease, unspecified: Secondary | ICD-10-CM | POA: Diagnosis not present

## 2018-06-04 DIAGNOSIS — R2689 Other abnormalities of gait and mobility: Secondary | ICD-10-CM | POA: Diagnosis not present

## 2018-06-04 DIAGNOSIS — F0280 Dementia in other diseases classified elsewhere without behavioral disturbance: Secondary | ICD-10-CM | POA: Diagnosis not present

## 2018-06-04 DIAGNOSIS — I129 Hypertensive chronic kidney disease with stage 1 through stage 4 chronic kidney disease, or unspecified chronic kidney disease: Secondary | ICD-10-CM | POA: Diagnosis not present

## 2018-06-04 DIAGNOSIS — N183 Chronic kidney disease, stage 3 (moderate): Secondary | ICD-10-CM | POA: Diagnosis not present

## 2018-06-04 DIAGNOSIS — G309 Alzheimer's disease, unspecified: Secondary | ICD-10-CM | POA: Diagnosis not present

## 2018-06-04 DIAGNOSIS — I872 Venous insufficiency (chronic) (peripheral): Secondary | ICD-10-CM | POA: Diagnosis not present

## 2018-06-08 DIAGNOSIS — F0280 Dementia in other diseases classified elsewhere without behavioral disturbance: Secondary | ICD-10-CM | POA: Diagnosis not present

## 2018-06-08 DIAGNOSIS — I872 Venous insufficiency (chronic) (peripheral): Secondary | ICD-10-CM | POA: Diagnosis not present

## 2018-06-08 DIAGNOSIS — R2689 Other abnormalities of gait and mobility: Secondary | ICD-10-CM | POA: Diagnosis not present

## 2018-06-08 DIAGNOSIS — G309 Alzheimer's disease, unspecified: Secondary | ICD-10-CM | POA: Diagnosis not present

## 2018-06-08 DIAGNOSIS — N183 Chronic kidney disease, stage 3 (moderate): Secondary | ICD-10-CM | POA: Diagnosis not present

## 2018-06-08 DIAGNOSIS — I129 Hypertensive chronic kidney disease with stage 1 through stage 4 chronic kidney disease, or unspecified chronic kidney disease: Secondary | ICD-10-CM | POA: Diagnosis not present

## 2018-06-14 DIAGNOSIS — F419 Anxiety disorder, unspecified: Secondary | ICD-10-CM | POA: Diagnosis not present

## 2018-06-14 DIAGNOSIS — F0390 Unspecified dementia without behavioral disturbance: Secondary | ICD-10-CM | POA: Diagnosis not present

## 2018-06-14 DIAGNOSIS — Z9181 History of falling: Secondary | ICD-10-CM | POA: Diagnosis not present

## 2018-06-19 DIAGNOSIS — I10 Essential (primary) hypertension: Secondary | ICD-10-CM | POA: Diagnosis not present

## 2018-06-19 DIAGNOSIS — N183 Chronic kidney disease, stage 3 (moderate): Secondary | ICD-10-CM | POA: Diagnosis not present

## 2018-06-19 DIAGNOSIS — F0390 Unspecified dementia without behavioral disturbance: Secondary | ICD-10-CM | POA: Diagnosis not present

## 2018-06-19 DIAGNOSIS — B009 Herpesviral infection, unspecified: Secondary | ICD-10-CM | POA: Diagnosis not present

## 2018-06-21 ENCOUNTER — Encounter (HOSPITAL_COMMUNITY): Payer: Medicare Other

## 2018-07-04 DIAGNOSIS — H25013 Cortical age-related cataract, bilateral: Secondary | ICD-10-CM | POA: Diagnosis not present

## 2018-07-04 DIAGNOSIS — H5010 Unspecified exotropia: Secondary | ICD-10-CM | POA: Diagnosis not present

## 2018-07-04 DIAGNOSIS — H2513 Age-related nuclear cataract, bilateral: Secondary | ICD-10-CM | POA: Diagnosis not present

## 2018-07-17 DIAGNOSIS — I10 Essential (primary) hypertension: Secondary | ICD-10-CM | POA: Diagnosis not present

## 2018-07-17 DIAGNOSIS — F0390 Unspecified dementia without behavioral disturbance: Secondary | ICD-10-CM | POA: Diagnosis not present

## 2018-07-17 DIAGNOSIS — K5900 Constipation, unspecified: Secondary | ICD-10-CM | POA: Diagnosis not present

## 2018-07-17 DIAGNOSIS — G4700 Insomnia, unspecified: Secondary | ICD-10-CM | POA: Diagnosis not present

## 2018-07-19 DIAGNOSIS — F0390 Unspecified dementia without behavioral disturbance: Secondary | ICD-10-CM | POA: Diagnosis not present

## 2018-07-19 DIAGNOSIS — F419 Anxiety disorder, unspecified: Secondary | ICD-10-CM | POA: Diagnosis not present

## 2018-07-19 DIAGNOSIS — Z9181 History of falling: Secondary | ICD-10-CM | POA: Diagnosis not present

## 2018-08-14 DIAGNOSIS — B009 Herpesviral infection, unspecified: Secondary | ICD-10-CM | POA: Diagnosis not present

## 2018-08-14 DIAGNOSIS — F0390 Unspecified dementia without behavioral disturbance: Secondary | ICD-10-CM | POA: Diagnosis not present

## 2018-08-14 DIAGNOSIS — I10 Essential (primary) hypertension: Secondary | ICD-10-CM | POA: Diagnosis not present

## 2018-08-14 DIAGNOSIS — K5900 Constipation, unspecified: Secondary | ICD-10-CM | POA: Diagnosis not present

## 2018-08-15 DIAGNOSIS — F0390 Unspecified dementia without behavioral disturbance: Secondary | ICD-10-CM | POA: Diagnosis not present

## 2018-08-15 DIAGNOSIS — F419 Anxiety disorder, unspecified: Secondary | ICD-10-CM | POA: Diagnosis not present

## 2018-08-15 DIAGNOSIS — Z9181 History of falling: Secondary | ICD-10-CM | POA: Diagnosis not present

## 2018-08-28 ENCOUNTER — Inpatient Hospital Stay (HOSPITAL_COMMUNITY): Payer: Medicare Other | Attending: Internal Medicine

## 2018-08-28 ENCOUNTER — Encounter (HOSPITAL_COMMUNITY): Payer: Self-pay

## 2018-08-28 DIAGNOSIS — Z853 Personal history of malignant neoplasm of breast: Secondary | ICD-10-CM | POA: Insufficient documentation

## 2018-08-28 DIAGNOSIS — Z452 Encounter for adjustment and management of vascular access device: Secondary | ICD-10-CM | POA: Diagnosis not present

## 2018-08-28 DIAGNOSIS — Z23 Encounter for immunization: Secondary | ICD-10-CM | POA: Insufficient documentation

## 2018-08-28 MED ORDER — SODIUM CHLORIDE 0.9% FLUSH
10.0000 mL | INTRAVENOUS | Status: DC | PRN
  Administered 2018-08-28: 10 mL via INTRAVENOUS
  Filled 2018-08-28: qty 10

## 2018-08-28 MED ORDER — INFLUENZA VAC SPLIT QUAD 0.5 ML IM SUSY
0.5000 mL | PREFILLED_SYRINGE | Freq: Once | INTRAMUSCULAR | Status: AC
  Administered 2018-08-28: 0.5 mL via INTRAMUSCULAR

## 2018-08-28 MED ORDER — INFLUENZA VAC SPLIT QUAD 0.5 ML IM SUSY
PREFILLED_SYRINGE | INTRAMUSCULAR | Status: AC
  Filled 2018-08-28: qty 0.5

## 2018-08-28 MED ORDER — HEPARIN SOD (PORK) LOCK FLUSH 100 UNIT/ML IV SOLN
500.0000 [IU] | Freq: Once | INTRAVENOUS | Status: AC
  Administered 2018-08-28: 500 [IU] via INTRAVENOUS

## 2018-08-28 NOTE — Patient Instructions (Signed)
[place] at [facility] Discharge Instructions  Portacath flushed per protocol and Influenza vaccine given today. Follow-up as scheduled. Call clinic for any questions or concerns   Thank you for choosing [place] at [facility] to provide your oncology and hematology care.  To afford each patient quality time with our provider, please arrive at least 15 minutes before your scheduled appointment time.   If you have a lab appointment with the [facility] please come in thru the  Main Entrance and check in at the main information desk  You need to re-schedule your appointment should you arrive 10 or more minutes late.  We strive to give you quality time with our providers, and arriving late affects you and other patients whose appointments are after yours.  Also, if you no show three or more times for appointments you may be dismissed from the clinic at the providers discretion.     Again, thank you for choosing [facility].  Our hope is that these requests will decrease the amount of time that you wait before being seen by our physicians.       _____________________________________________________________  Should you have questions after your visit to [facility], please contact our office at [phone] between the hours of 8:00 a.m. and 4:30 p.m.  Voicemails left after 4:00 p.m. will not be returned until the following business day.  For prescription refill requests, have your pharmacy contact our office and allow 72 hours.    Cancer Center Support Programs:   > Cancer Support Group  2nd Tuesday of the month 1pm-2pm, Journey Room

## 2018-08-28 NOTE — Progress Notes (Signed)
Emma Dawson tolerated Portacath flush and Influenza vaccine well without complaints or incident. VSS. Port accessed with 20 gauge needle without blood return but flushed easily with 10 ml NS and 5 ml Heparin per protocol then de-accessed. Pt discharged via wheelchair in satisfactory condition accompanied by caregiver

## 2018-09-12 DIAGNOSIS — B351 Tinea unguium: Secondary | ICD-10-CM | POA: Diagnosis not present

## 2018-09-12 DIAGNOSIS — M2012 Hallux valgus (acquired), left foot: Secondary | ICD-10-CM | POA: Diagnosis not present

## 2018-09-12 DIAGNOSIS — N189 Chronic kidney disease, unspecified: Secondary | ICD-10-CM | POA: Diagnosis not present

## 2018-09-12 DIAGNOSIS — M79675 Pain in left toe(s): Secondary | ICD-10-CM | POA: Diagnosis not present

## 2018-09-12 DIAGNOSIS — L84 Corns and callosities: Secondary | ICD-10-CM | POA: Diagnosis not present

## 2018-09-13 DIAGNOSIS — N183 Chronic kidney disease, stage 3 (moderate): Secondary | ICD-10-CM | POA: Diagnosis not present

## 2018-09-13 DIAGNOSIS — F0390 Unspecified dementia without behavioral disturbance: Secondary | ICD-10-CM | POA: Diagnosis not present

## 2018-09-13 DIAGNOSIS — K5900 Constipation, unspecified: Secondary | ICD-10-CM | POA: Diagnosis not present

## 2018-09-13 DIAGNOSIS — I10 Essential (primary) hypertension: Secondary | ICD-10-CM | POA: Diagnosis not present

## 2018-09-14 DIAGNOSIS — F419 Anxiety disorder, unspecified: Secondary | ICD-10-CM | POA: Diagnosis not present

## 2018-09-14 DIAGNOSIS — Z9181 History of falling: Secondary | ICD-10-CM | POA: Diagnosis not present

## 2018-09-14 DIAGNOSIS — F0390 Unspecified dementia without behavioral disturbance: Secondary | ICD-10-CM | POA: Diagnosis not present

## 2018-09-26 DIAGNOSIS — G301 Alzheimer's disease with late onset: Secondary | ICD-10-CM | POA: Diagnosis not present

## 2018-09-26 DIAGNOSIS — Z79899 Other long term (current) drug therapy: Secondary | ICD-10-CM | POA: Diagnosis not present

## 2018-09-26 DIAGNOSIS — I10 Essential (primary) hypertension: Secondary | ICD-10-CM | POA: Diagnosis not present

## 2018-09-26 DIAGNOSIS — G4701 Insomnia due to medical condition: Secondary | ICD-10-CM | POA: Diagnosis not present

## 2018-10-07 DIAGNOSIS — I10 Essential (primary) hypertension: Secondary | ICD-10-CM | POA: Diagnosis not present

## 2018-10-07 DIAGNOSIS — F0390 Unspecified dementia without behavioral disturbance: Secondary | ICD-10-CM | POA: Diagnosis not present

## 2018-10-07 DIAGNOSIS — K5900 Constipation, unspecified: Secondary | ICD-10-CM | POA: Diagnosis not present

## 2018-10-07 DIAGNOSIS — N183 Chronic kidney disease, stage 3 (moderate): Secondary | ICD-10-CM | POA: Diagnosis not present

## 2018-10-09 ENCOUNTER — Encounter: Payer: Self-pay | Admitting: *Deleted

## 2018-10-11 DIAGNOSIS — I10 Essential (primary) hypertension: Secondary | ICD-10-CM | POA: Diagnosis not present

## 2018-10-16 DIAGNOSIS — F0390 Unspecified dementia without behavioral disturbance: Secondary | ICD-10-CM | POA: Diagnosis not present

## 2018-10-16 DIAGNOSIS — F419 Anxiety disorder, unspecified: Secondary | ICD-10-CM | POA: Diagnosis not present

## 2018-11-01 ENCOUNTER — Encounter: Payer: Self-pay | Admitting: *Deleted

## 2018-11-05 DIAGNOSIS — K5900 Constipation, unspecified: Secondary | ICD-10-CM | POA: Diagnosis not present

## 2018-11-05 DIAGNOSIS — N183 Chronic kidney disease, stage 3 (moderate): Secondary | ICD-10-CM | POA: Diagnosis not present

## 2018-11-05 DIAGNOSIS — I10 Essential (primary) hypertension: Secondary | ICD-10-CM | POA: Diagnosis not present

## 2018-11-05 DIAGNOSIS — F0390 Unspecified dementia without behavioral disturbance: Secondary | ICD-10-CM | POA: Diagnosis not present

## 2018-11-19 DIAGNOSIS — F0390 Unspecified dementia without behavioral disturbance: Secondary | ICD-10-CM | POA: Diagnosis not present

## 2018-11-19 DIAGNOSIS — F419 Anxiety disorder, unspecified: Secondary | ICD-10-CM | POA: Diagnosis not present

## 2018-11-29 ENCOUNTER — Encounter (HOSPITAL_COMMUNITY): Payer: Medicare Other

## 2018-12-11 ENCOUNTER — Telehealth: Payer: Self-pay | Admitting: Family Medicine

## 2018-12-11 NOTE — Telephone Encounter (Signed)
Called Pt for AWV talked with Tonya her HCPOA and she said Emma Dawson is in assisted living at [facility] and she will not be having AWV at the office.

## 2018-12-26 ENCOUNTER — Encounter (HOSPITAL_COMMUNITY): Payer: Self-pay

## 2018-12-26 ENCOUNTER — Other Ambulatory Visit: Payer: Self-pay

## 2018-12-26 ENCOUNTER — Inpatient Hospital Stay (HOSPITAL_COMMUNITY): Payer: Medicare Other | Attending: Hematology

## 2018-12-26 DIAGNOSIS — Z853 Personal history of malignant neoplasm of breast: Secondary | ICD-10-CM | POA: Insufficient documentation

## 2018-12-26 DIAGNOSIS — Z452 Encounter for adjustment and management of vascular access device: Secondary | ICD-10-CM | POA: Insufficient documentation

## 2018-12-26 MED ORDER — HEPARIN SOD (PORK) LOCK FLUSH 100 UNIT/ML IV SOLN
500.0000 [IU] | Freq: Once | INTRAVENOUS | Status: AC
  Administered 2018-12-26: 500 [IU] via INTRAVENOUS

## 2018-12-26 MED ORDER — SODIUM CHLORIDE 0.9% FLUSH
10.0000 mL | INTRAVENOUS | Status: DC | PRN
  Administered 2018-12-26: 10 mL via INTRAVENOUS
  Filled 2018-12-26: qty 10

## 2018-12-26 NOTE — Progress Notes (Signed)
Emma Dawson presented for Portacath access and flush.  Proper placement of portacath confirmed by CXR.  Portacath located right chest wall accessed with  H 20 needle.  No blood return and  Portacath flushed with 54ml NS and 500U/61ml Heparin and needle removed intact.  Procedure tolerated well and without incident.   Pt d/c via wheelchair

## 2019-01-02 IMAGING — DX DG FOOT COMPLETE 3+V*R*
3 series · 3 of 3 positions shown · non-contrast
Comparison: None.

CLINICAL DATA: Right lower extremity pain and swelling

EXAM:
RIGHT FOOT COMPLETE - 3+ VIEW

[foot ap]
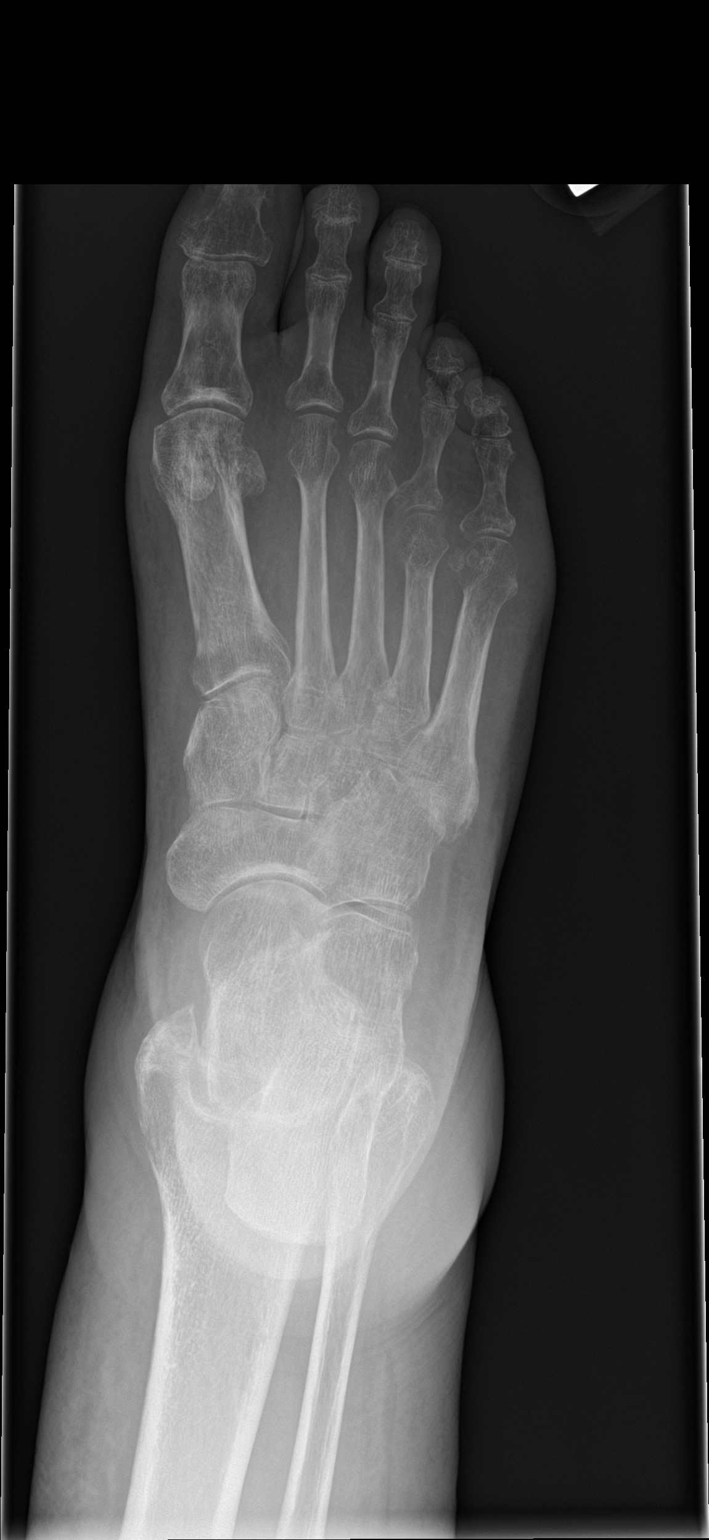

[foot obl]
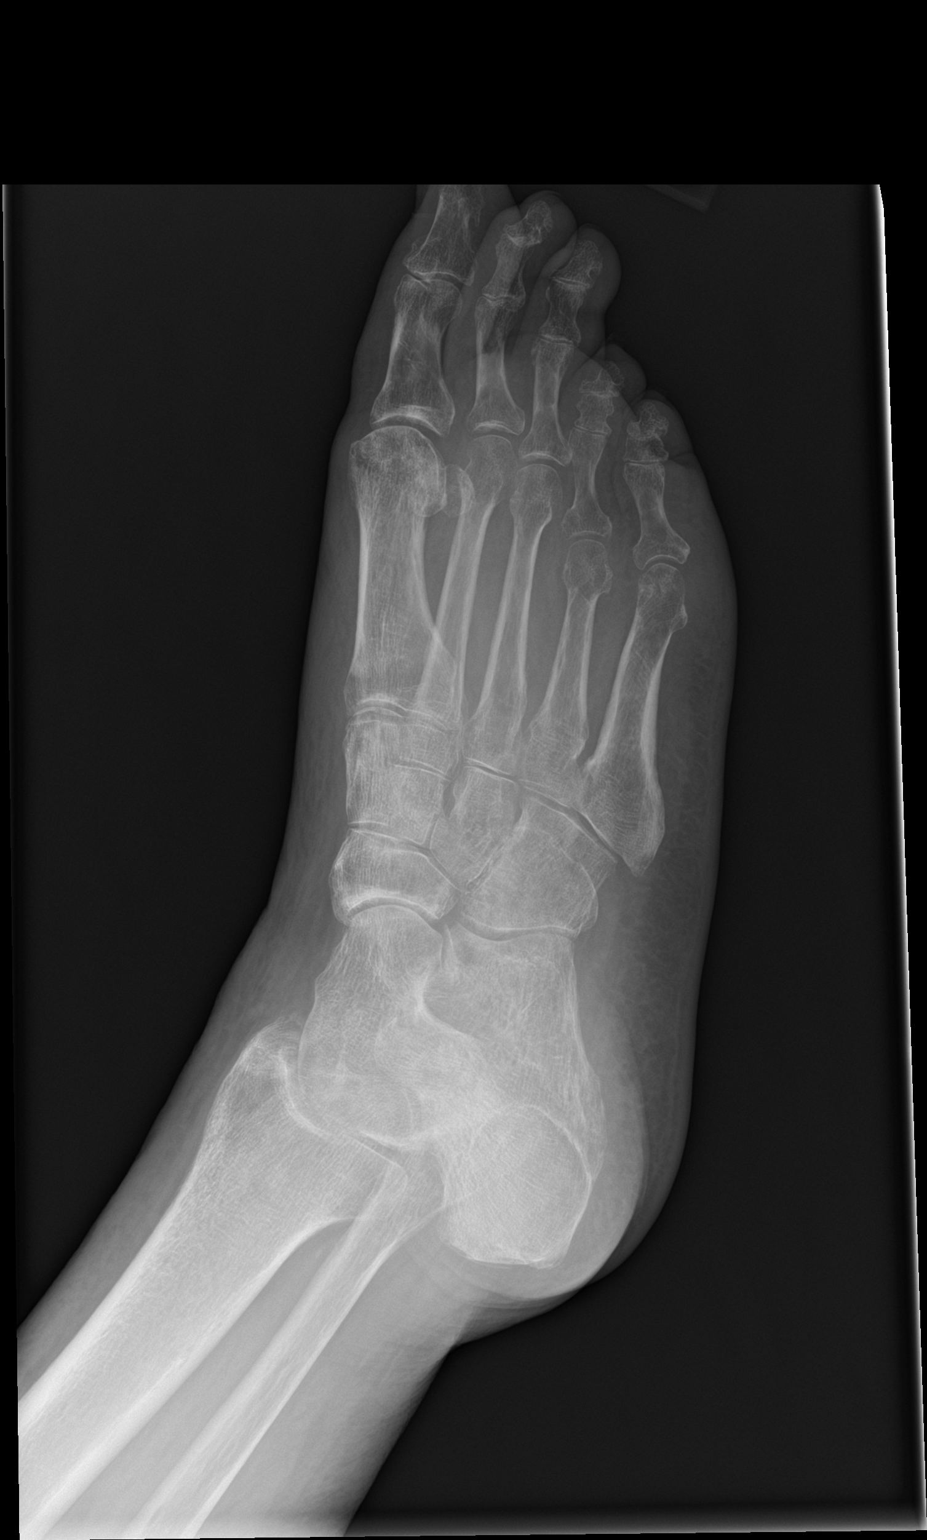

[foot lat]
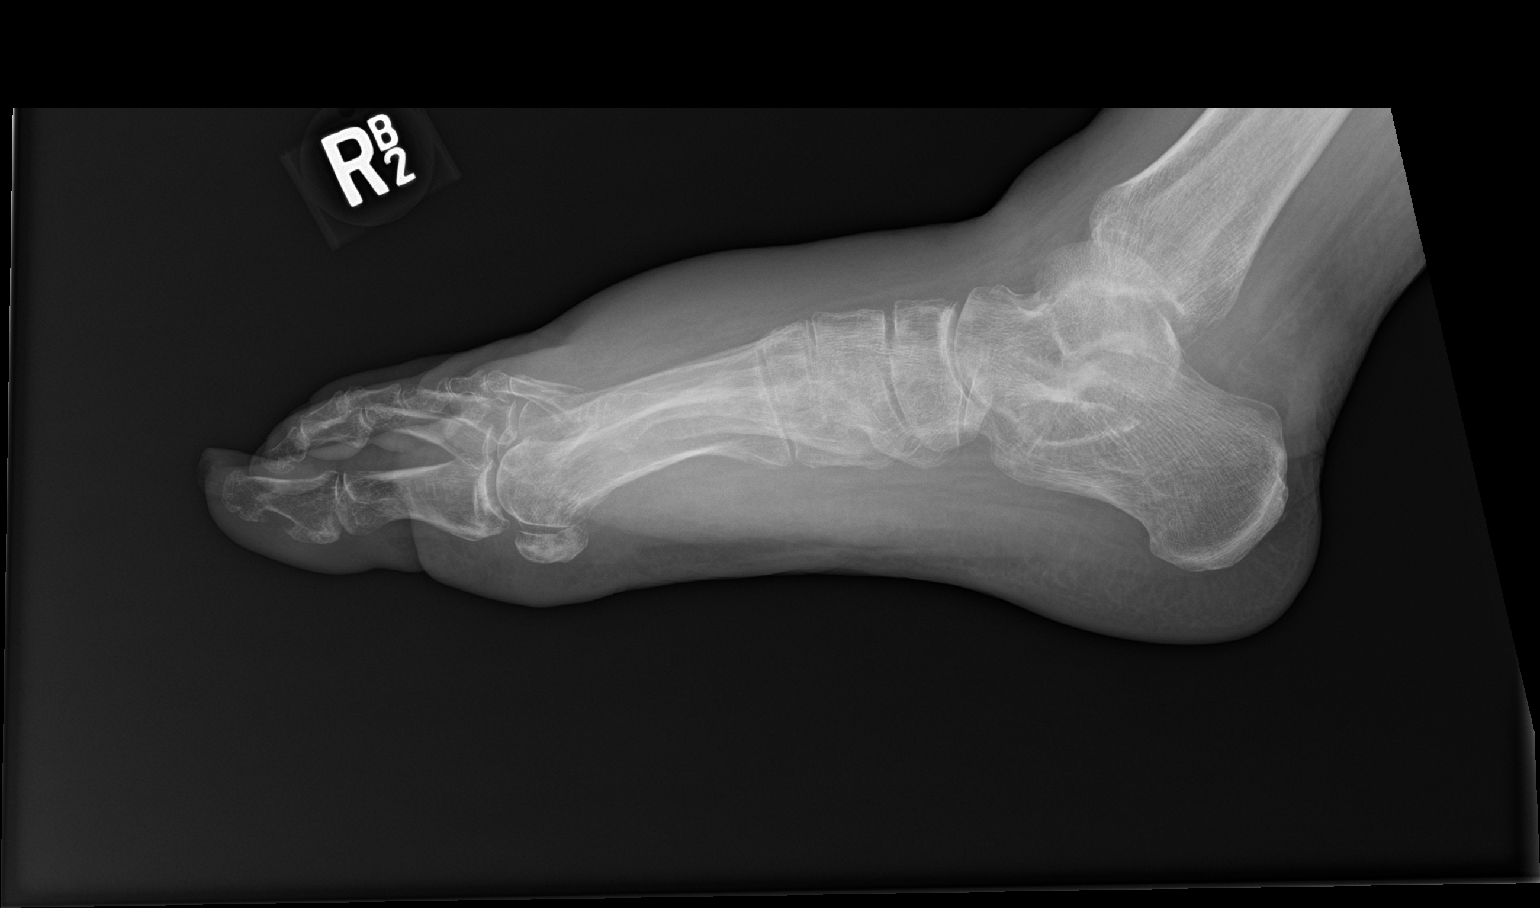

[3 of 3 positions shown; findings below may reference images not displayed]

FINDINGS: Severe osteopenia. No acute fracture or dislocation. Joint spaces
are relatively well maintained. Severe soft tissue swelling along
the dorsal aspect of the foot.
IMPRESSION: 1.  No acute osseous injury of the right foot.
2. Soft tissue swelling along the dorsal aspect of the foot.

## 2019-01-02 IMAGING — DX DG CHEST 2V
2 series · 2 of 2 positions shown · non-contrast
Comparison: 01/23/2017

CLINICAL DATA: Right lower extremity swelling, intermittent pain

EXAM:
CHEST  2 VIEW

[chest lat]
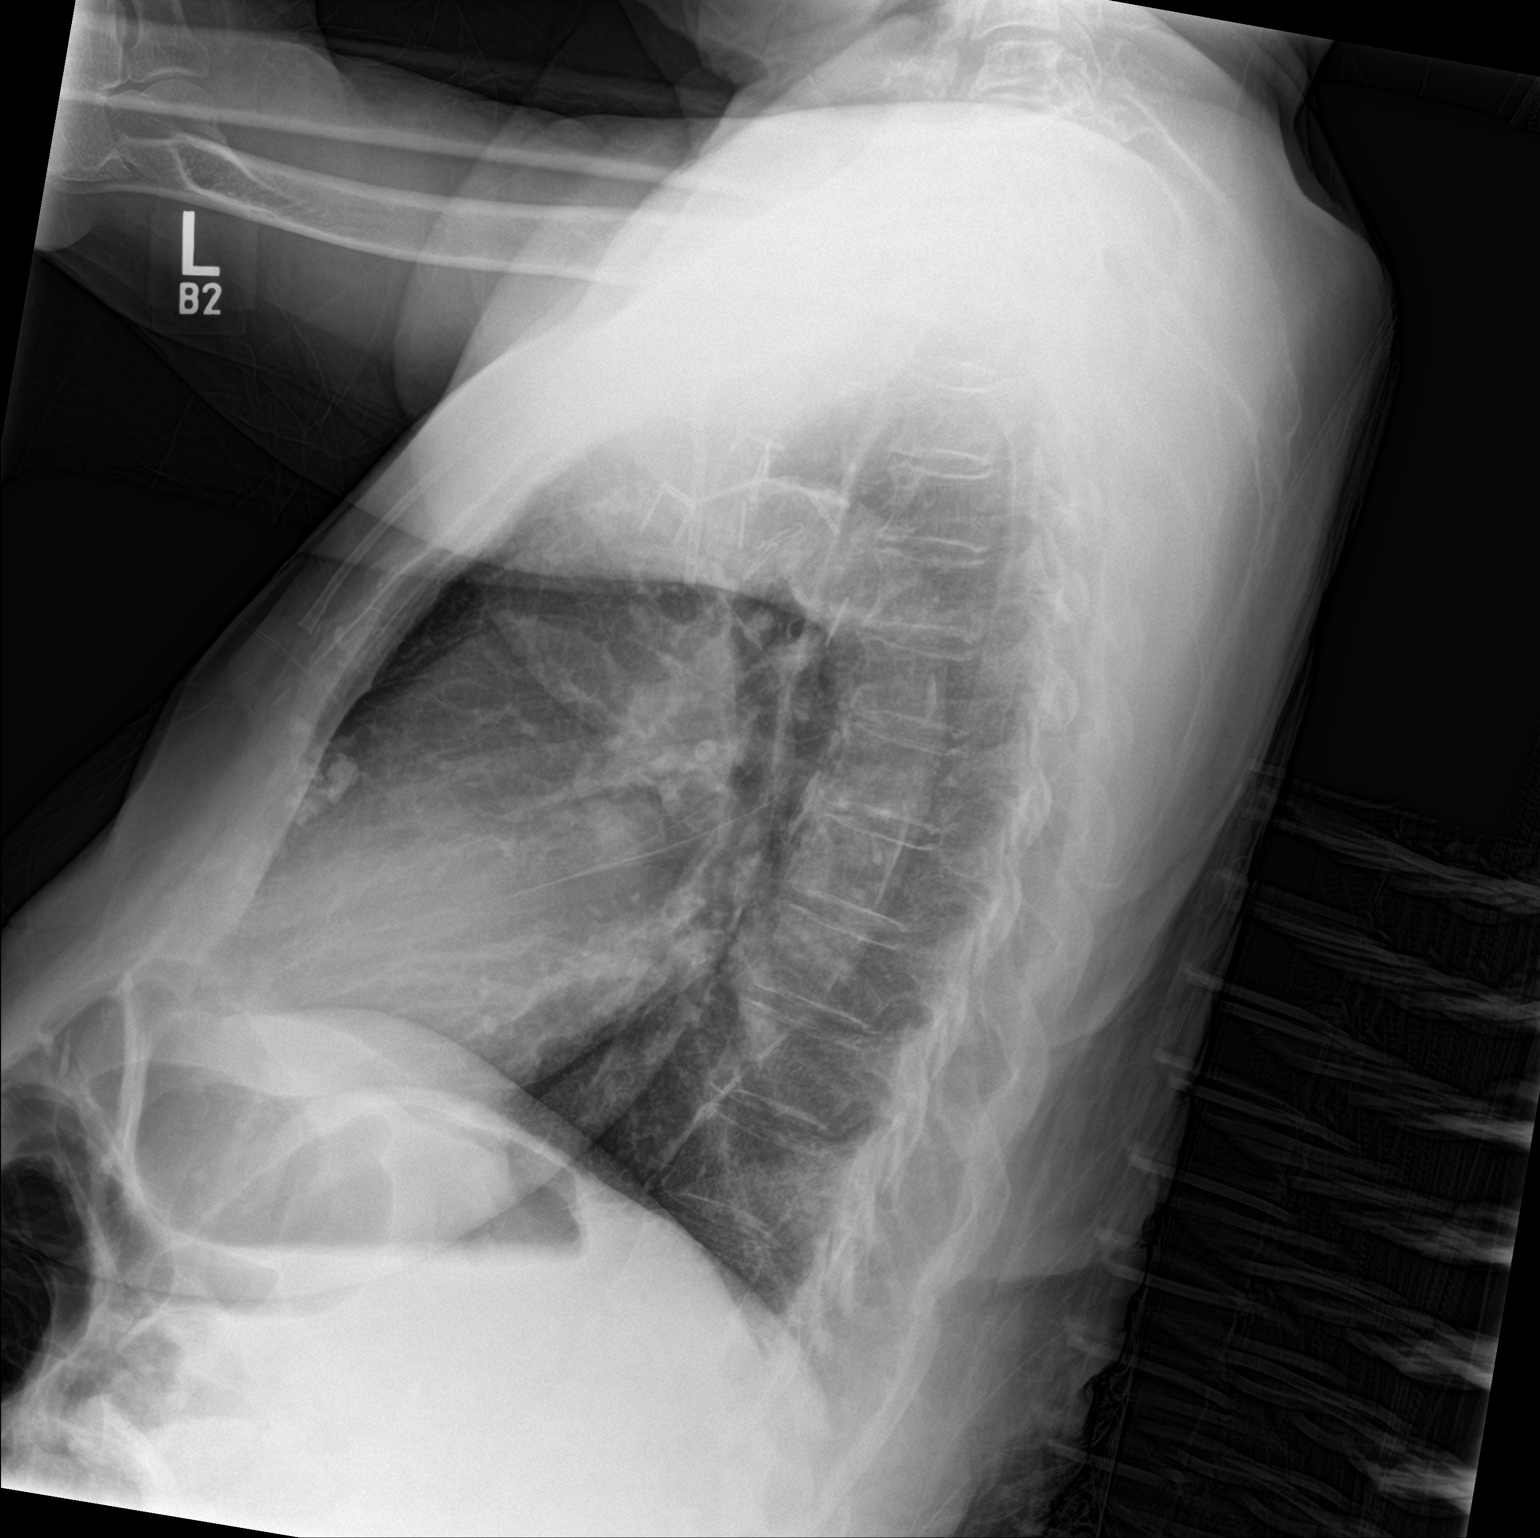

[chest ap]
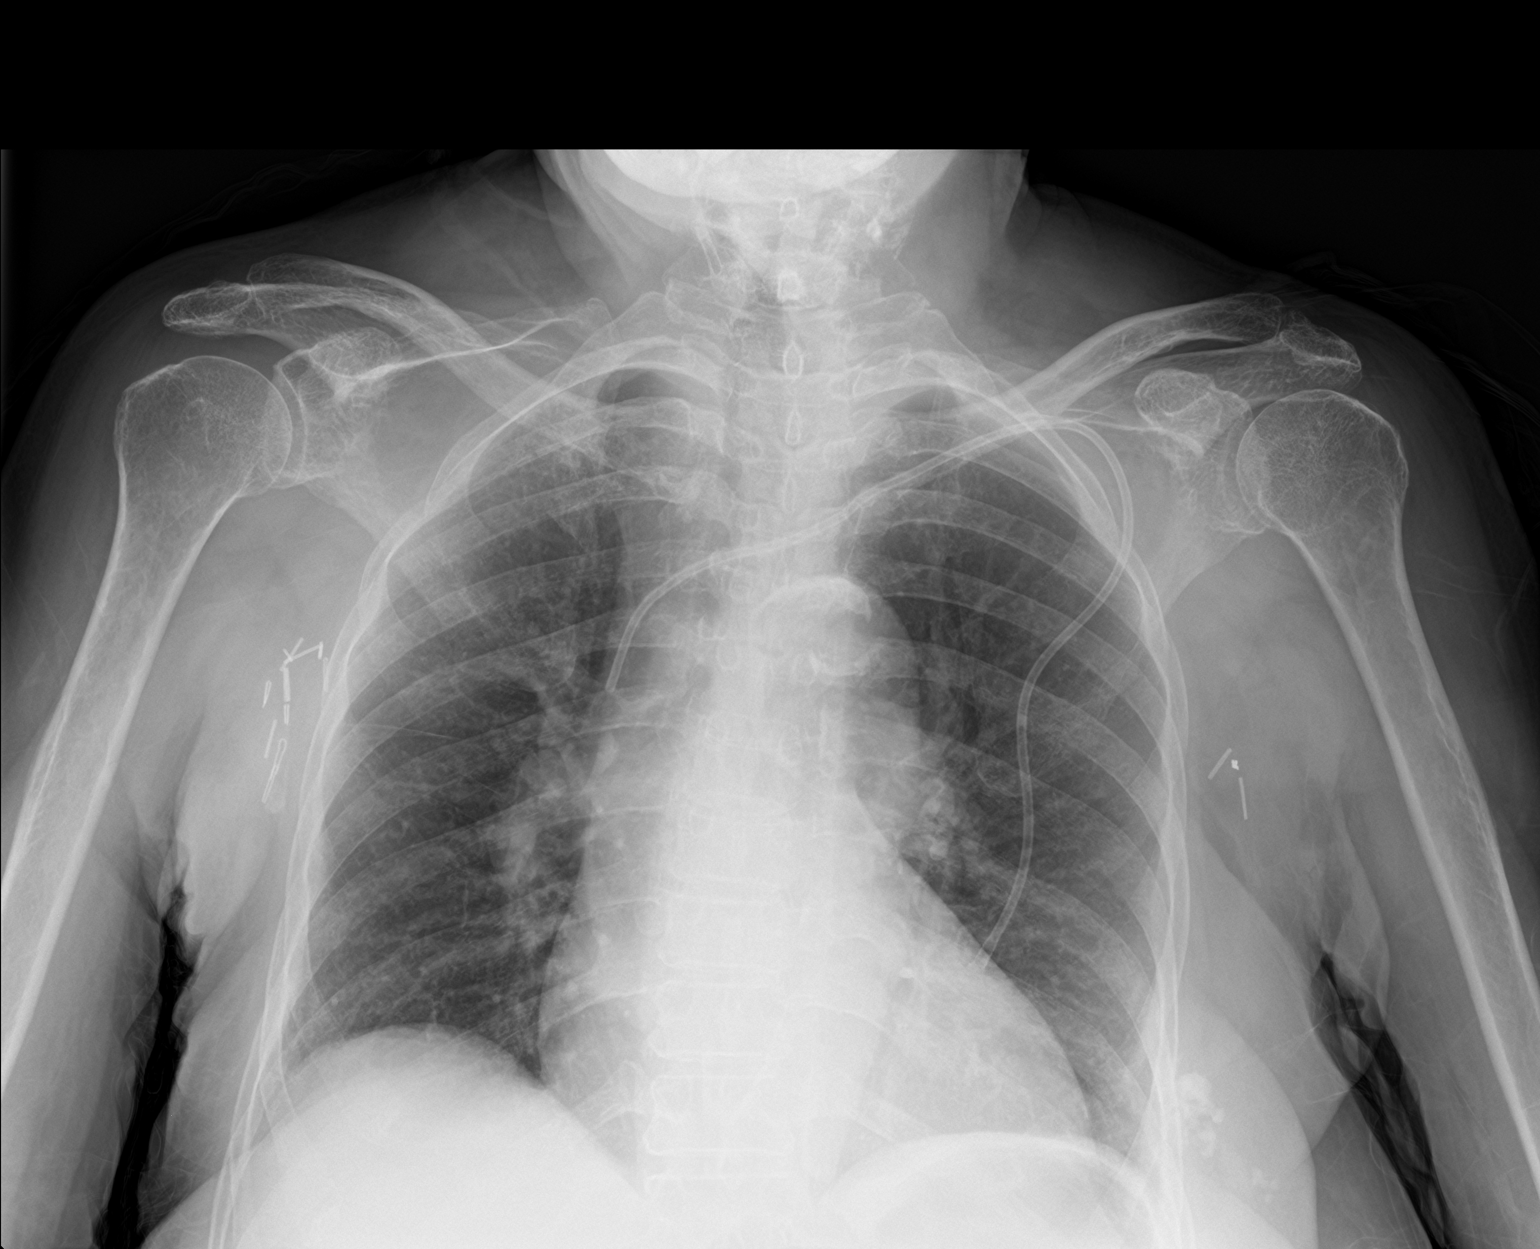

[2 of 2 positions shown; findings below may reference images not displayed]

FINDINGS: There is a left-sided Port-A-Cath with the tip projecting over the
SVC. There is no focal parenchymal opacity. There is no pleural
effusion or pneumothorax. There is stable cardiomegaly.

The osseous structures are unremarkable.
IMPRESSION: No active cardiopulmonary disease.

## 2019-01-28 ENCOUNTER — Encounter (HOSPITAL_COMMUNITY): Payer: Self-pay

## 2019-01-28 ENCOUNTER — Other Ambulatory Visit: Payer: Self-pay

## 2019-01-28 ENCOUNTER — Emergency Department (HOSPITAL_COMMUNITY)
Admission: EM | Admit: 2019-01-28 | Discharge: 2019-01-28 | Discharge status: Against Medical Advice | Payer: Medicare Other | Attending: Emergency Medicine | Admitting: Emergency Medicine

## 2019-01-28 ENCOUNTER — Emergency Department (HOSPITAL_COMMUNITY): Payer: Medicare Other

## 2019-01-28 DIAGNOSIS — N939 Abnormal uterine and vaginal bleeding, unspecified: Secondary | ICD-10-CM | POA: Diagnosis not present

## 2019-01-28 DIAGNOSIS — N183 Chronic kidney disease, stage 3 (moderate): Secondary | ICD-10-CM | POA: Diagnosis not present

## 2019-01-28 DIAGNOSIS — G309 Alzheimer's disease, unspecified: Secondary | ICD-10-CM | POA: Insufficient documentation

## 2019-01-28 DIAGNOSIS — I129 Hypertensive chronic kidney disease with stage 1 through stage 4 chronic kidney disease, or unspecified chronic kidney disease: Secondary | ICD-10-CM | POA: Insufficient documentation

## 2019-01-28 DIAGNOSIS — Z79899 Other long term (current) drug therapy: Secondary | ICD-10-CM | POA: Insufficient documentation

## 2019-01-28 DIAGNOSIS — Z90710 Acquired absence of both cervix and uterus: Secondary | ICD-10-CM | POA: Insufficient documentation

## 2019-01-28 DIAGNOSIS — Z9011 Acquired absence of right breast and nipple: Secondary | ICD-10-CM | POA: Insufficient documentation

## 2019-01-28 DIAGNOSIS — F0280 Dementia in other diseases classified elsewhere without behavioral disturbance: Secondary | ICD-10-CM | POA: Diagnosis not present

## 2019-01-28 DIAGNOSIS — Z853 Personal history of malignant neoplasm of breast: Secondary | ICD-10-CM | POA: Insufficient documentation

## 2019-01-28 DIAGNOSIS — N281 Cyst of kidney, acquired: Secondary | ICD-10-CM | POA: Diagnosis not present

## 2019-01-28 DIAGNOSIS — Z9221 Personal history of antineoplastic chemotherapy: Secondary | ICD-10-CM | POA: Insufficient documentation

## 2019-01-28 DIAGNOSIS — Z7982 Long term (current) use of aspirin: Secondary | ICD-10-CM | POA: Insufficient documentation

## 2019-01-28 LAB — CBC WITH DIFFERENTIAL/PLATELET
Abs Immature Granulocytes: 0.09 10*3/uL — ABNORMAL HIGH (ref 0.00–0.07)
Basophils Absolute: 0.1 10*3/uL (ref 0.0–0.1)
Basophils Relative: 0 %
Eosinophils Absolute: 0.1 10*3/uL (ref 0.0–0.5)
Eosinophils Relative: 1 %
HEMATOCRIT: 25.7 % — AB (ref 36.0–46.0)
HEMOGLOBIN: 7.6 g/dL — AB (ref 12.0–15.0)
Immature Granulocytes: 1 %
LYMPHS PCT: 10 %
Lymphs Abs: 1.5 10*3/uL (ref 0.7–4.0)
MCH: 23.7 pg — ABNORMAL LOW (ref 26.0–34.0)
MCHC: 29.6 g/dL — ABNORMAL LOW (ref 30.0–36.0)
MCV: 80.1 fL (ref 80.0–100.0)
MONO ABS: 1.5 10*3/uL — AB (ref 0.1–1.0)
Monocytes Relative: 9 %
Neutro Abs: 12.2 10*3/uL — ABNORMAL HIGH (ref 1.7–7.7)
Neutrophils Relative %: 79 %
Platelets: 448 10*3/uL — ABNORMAL HIGH (ref 150–400)
RBC: 3.21 MIL/uL — ABNORMAL LOW (ref 3.87–5.11)
RDW: 15.9 % — ABNORMAL HIGH (ref 11.5–15.5)
WBC: 15.4 10*3/uL — ABNORMAL HIGH (ref 4.0–10.5)
nRBC: 0 % (ref 0.0–0.2)

## 2019-01-28 LAB — BASIC METABOLIC PANEL
Anion gap: 8 (ref 5–15)
BUN: 17 mg/dL (ref 8–23)
CO2: 26 mmol/L (ref 22–32)
Calcium: 9.2 mg/dL (ref 8.9–10.3)
Chloride: 101 mmol/L (ref 98–111)
Creatinine, Ser: 1.03 mg/dL — ABNORMAL HIGH (ref 0.44–1.00)
GFR calc Af Amer: 57 mL/min — ABNORMAL LOW (ref >60)
GFR calc non Af Amer: 50 mL/min — ABNORMAL LOW (ref >60)
Glucose, Bld: 114 mg/dL — ABNORMAL HIGH (ref 70–99)
POTASSIUM: 4.2 mmol/L (ref 3.5–5.1)
Sodium: 135 mmol/L (ref 135–145)

## 2019-01-28 MED ORDER — IOHEXOL 300 MG/ML  SOLN
100.0000 mL | Freq: Once | INTRAMUSCULAR | Status: AC | PRN
  Administered 2019-01-28: 100 mL via INTRAVENOUS

## 2019-01-28 MED ORDER — SODIUM CHLORIDE 0.9 % IV BOLUS (SEPSIS)
500.0000 mL | Freq: Once | INTRAVENOUS | Status: AC
  Administered 2019-01-28: 500 mL via INTRAVENOUS

## 2019-01-28 NOTE — ED Notes (Signed)
EDP called to the bedside, Unable to do in and out. Strong odor, yellow drainage, blood, abscess?  Swelling, denies pain,

## 2019-01-28 NOTE — ED Triage Notes (Signed)
Pt is having vaginal bleeding. New onset that started yesterday. Pt is a resident of [facility] nursing home. No other information given

## 2019-01-28 NOTE — ED Provider Notes (Signed)
[facility] EMERGENCY DEPARTMENT Provider Note   CSN: [id number] Arrival date & time: 01/28/19  1542    History   Chief Complaint Chief Complaint  Patient presents with  . Vaginal Bleeding    HPI Emma Dawson is a 85 y.o. female.     The patient was brought to the emergency department for vaginal bleeding.  This started number days ago  The history is provided by the patient and a relative.  Vaginal Bleeding  Quality:  Bright red Severity:  Mild Onset quality:  Gradual Timing:  Constant Progression:  Waxing and waning Chronicity:  New Menstrual history: Has had a hysterectomy. Number of pads used:  Unknown Possible pregnancy: no   Context: at rest   Relieved by:  Nothing Worsened by:  Nothing Ineffective treatments:  None tried Associated symptoms: no abdominal pain, no back pain and no fatigue     Past Medical History:  Diagnosis Date  . Allergic rhinitis   . Alzheimer's dementia ([place])   . Cancer ([place]) 03/2003   radiation and chemo [facility]  . History of breast cancer   . History of chemotherapy    And radiation secondary to breast cancer  . Hyperlipidemia   . Hypertension   . IGT (impaired glucose tolerance) 2014   DIET MANAGED  . Normocytic anemia   . Osteoarthritis   . Port catheter in place 03/05/2013    Patient Active Problem List   Diagnosis Date Noted  . CKD (chronic kidney disease) stage 3, GFR 30-59 ml/min (HCC) 12/07/2017  . Generalized weakness 12/07/2017  . Edema of right lower extremity 12/06/2017  . Alzheimer's dementia ([place]) 12/06/2017  . Incontinence in female 01/26/2017  . Knee pain, bilateral 12/16/2016  . Need for assistance due to unsteady gait 12/16/2016  . Recurrent falls while walking 12/16/2016  . Memory loss 06/02/2015  . Personal history of noncompliance with medical treatment, presenting hazards to health 09/27/2013  . Genital herpes 05/02/2008  . CAROTID ARTERY STENOSIS, LEFT 05/02/2008  . SYNCOPE, VASOVAGAL 05/02/2008    . CARDIAC MURMUR 05/02/2008  . Anemia 10/03/2007  . Hyperlipidemia LDL goal <100 09/27/2006  . Essential hypertension 09/27/2006  . Allergic rhinitis 09/27/2006  . Osteoarthritis 09/27/2006  . Invasive ductal carcinoma of breast ([place]) 09/27/2006    Past Surgical History:  Procedure Laterality Date  . ABDOMINAL HYSTERECTOMY  1970   Fibroid tumors, benign  . BREAST SURGERY Right 05/06/2003   MRM  . MASTECTOMY  2004   Right  . PORTACATH PLACEMENT       OB History   No obstetric history on file.      Home Medications    Prior to Admission medications   Medication Sig Start Date End Date Taking? Authorizing Provider  acetaminophen (TYLENOL) 500 MG tablet One tablet twice daily as needed for knee pain Patient taking differently: Take 500 mg by mouth 2 (two) times daily as needed for mild pain or moderate pain.  05/08/12  Yes Fayrene Helper, MD  amLODipine (NORVASC) 5 MG tablet Take 5 mg by mouth every morning.   Yes [provider]  aspirin 81 MG tablet Take 81 mg by mouth every morning.    Yes [provider]  atorvastatin (LIPITOR) 40 MG tablet Take 1 tablet (40 mg total) by mouth daily. Patient taking differently: Take 40 mg by mouth every evening.  12/09/17  Yes Tat, Shanon Brow, MD  docusate sodium (COLACE) 100 MG capsule Take 100 mg by mouth daily as needed  for mild constipation.   Yes [provider]  donepezil (ARICEPT) 10 MG tablet Take 1 tablet (10 mg total) by mouth daily. 12/09/17  Yes Tat, Shanon Brow, MD  LORazepam (ATIVAN) 0.5 MG tablet Take 0.25 mg by mouth daily as needed for anxiety.   Yes [provider]  losartan (COZAAR) 100 MG tablet Take 100 mg by mouth daily.  08/27/18  Yes [provider]  memantine (NAMENDA) 10 MG tablet Take 10 mg by mouth 2 (two) times daily.  08/27/18  Yes [provider]  Multiple Vitamin (MULTIVITAMIN WITH MINERALS) TABS tablet Take 1 tablet by mouth daily.   Yes [provider]   polyethylene glycol (MIRALAX) packet Take 17 g by mouth daily. Patient taking differently: Take 17 g by mouth daily as needed for mild constipation.  05/08/18  Yes Horton, Barbette Hair, MD  QUEtiapine (SEROQUEL) 50 MG tablet Take 50 mg by mouth at bedtime.  08/27/18  Yes [provider]  Vitamin D, Ergocalciferol, (DRISDOL) 50000 units CAPS capsule Take 1 capsule (50,000 Units total) by mouth every 7 (seven) days. 12/14/17  Yes TatShanon Brow, MD    Family History Family History  Problem Relation Age of Onset  . Stroke Mother   . Heart attack Father   . Coronary artery disease Father   . Coronary artery disease Brother   . Heart attack Sister   . Heart disease Sister   . Arthritis Sister   . Heart disease Sister   . Dementia Sister   . Lung cancer Brother   . Prostate cancer Brother     Social History Social History   Tobacco Use  . Smoking status: Never Smoker  . Smokeless tobacco: Never Used  Substance Use Topics  . Alcohol use: No  . Drug use: No     Allergies   Aricept [donepezil hcl]   Review of Systems Review of Systems  Constitutional: Negative for appetite change and fatigue.  HENT: Negative for congestion, ear discharge and sinus pressure.   Eyes: Negative for discharge.  Respiratory: Negative for cough.   Cardiovascular: Negative for chest pain.  Gastrointestinal: Negative for abdominal pain and diarrhea.  Genitourinary: Positive for vaginal bleeding. Negative for frequency and hematuria.  Musculoskeletal: Negative for back pain.  Skin: Negative for rash.  Neurological: Negative for seizures and headaches.  Psychiatric/Behavioral: Negative for hallucinations.     Physical Exam Updated Vital Signs BP (!) 161/64   Pulse (!) 110   Temp 99.3 F (37.4 C) (Oral)   Wt 81.6 kg   SpO2 99%   BMI 32.92 kg/m   Physical Exam Vitals signs and nursing note reviewed.  Constitutional:      Appearance: She is well-developed.  HENT:     Head:  Normocephalic.     Nose: Nose normal.  Eyes:     General: No scleral icterus.    Conjunctiva/sclera: Conjunctivae normal.  Neck:     Musculoskeletal: Neck supple.     Thyroid: No thyromegaly.  Cardiovascular:     Rate and Rhythm: Normal rate and regular rhythm.     Heart sounds: No murmur. No friction rub. No gallop.   Pulmonary:     Breath sounds: No stridor. No wheezing or rales.  Chest:     Chest wall: No tenderness.  Abdominal:     General: There is no distension.     Tenderness: There is no abdominal tenderness. There is no rebound.  Genitourinary:    Comments: Vagina examined.  Patient's vagina appears to be necrotic and bleeding minimally. Musculoskeletal: Normal range of motion.  Lymphadenopathy:     Cervical: No cervical adenopathy.  Skin:    Findings: No erythema or rash.  Neurological:     Mental Status: She is oriented to person, place, and time.     Motor: No abnormal muscle tone.     Coordination: Coordination normal.  Psychiatric:        Behavior: Behavior normal.      ED Treatments / Results  Labs (all labs ordered are listed, but only abnormal results are displayed) Labs Reviewed  CBC WITH DIFFERENTIAL/PLATELET - Abnormal; Notable for the following components:      Result Value   WBC 15.4 (*)    RBC 3.21 (*)    Hemoglobin 7.6 (*)    HCT 25.7 (*)    MCH 23.7 (*)    MCHC 29.6 (*)    RDW 15.9 (*)    Platelets 448 (*)    Neutro Abs 12.2 (*)    Monocytes Absolute 1.5 (*)    Abs Immature Granulocytes 0.09 (*)    All other components within normal limits  BASIC METABOLIC PANEL - Abnormal; Notable for the following components:   Glucose, Bld 114 (*)    Creatinine, Ser 1.03 (*)    GFR calc non Af Amer 50 (*)    GFR calc Af Amer 57 (*)    All other components within normal limits  URINALYSIS, ROUTINE W REFLEX MICROSCOPIC    EKG None  Radiology Ct Abdomen Pelvis W Contrast  Result Date: 01/28/2019 CLINICAL DATA:  85 y/o F; new onset vaginal  bleeding. History of hysterectomy. History of breast cancer. EXAM: CT ABDOMEN AND PELVIS WITH CONTRAST TECHNIQUE: Multidetector CT imaging of the abdomen and pelvis was performed using the standard protocol following bolus administration of intravenous contrast. CONTRAST:  143mL OMNIPAQUE IOHEXOL 300 MG/ML  SOLN COMPARISON:  05/05/2003 CT abdomen and pelvis. 05/02/2005 PET-CT. FINDINGS: Lower chest: Right mastectomy. Hepatobiliary: No focal liver abnormality is seen. No gallstones, gallbladder wall thickening, or biliary dilatation. Pancreas: Unremarkable. No pancreatic ductal dilatation or surrounding inflammatory changes. Spleen: Normal in size without focal abnormality. Adrenals/Urinary Tract: Adrenal glands are unremarkable. Multiple simple renal cysts bilaterally. Right mid kidney 4.1 cm homogeneous mass measuring 47 HU, 46 HU on delayed phase without significant change on delayed phase. Stomach/Bowel: Stomach is within normal limits. Appendix not identified, no pericecal inflammation. No evidence of bowel wall thickening, distention, or inflammatory changes. Vascular/Lymphatic: Bulky bilateral necrotic inguinal lymphadenopathy measuring up to 5.5 x 5.0 cm on the left (AP by ML series 2, image 78). Abdominal aortic aneurysm measuring 4.4 x 4.5 cm series 5 image 45 and series 6, image 67. Reproductive: Hysterectomy. Nodular thickening of the walls of the vulva. Other: No abdominal wall hernia or abnormality. No abdominopelvic ascites. Musculoskeletal: No fracture is seen. Mild lumbar spine levocurvature with apex at L4. IMPRESSION: 1. Nodular thickening of the walls of the vulva and bulky necrotic inguinal lymphadenopathy, suspected metastatic vulvar cancer. 2. 4.1 cm right mid kidney likely hemorrhagic cyst, although technically indeterminate. Consider follow-up renal ultrasound for confirmation. 3. 4.5 cm abdominal aortic aneurysm. Recommend followup by abdomen and pelvis CTA in 6 months, and vascular surgery  referral/consultation if not already obtained. This recommendation follows ACR consensus guidelines: White Paper of the ACR Incidental Findings Committee II on Vascular Findings. J Am Coll Radiol 2013; 10:789-794. Electronically Signed   By: Kristine Garbe M.D.   On: 01/28/2019 19:05  Procedures Procedures (including critical care time)  Medications Ordered in ED Medications  sodium chloride 0.9 % bolus 500 mL (500 mLs Intravenous New Bag/Given 01/28/19 1712)  iohexol (OMNIPAQUE) 300 MG/ML solution 100 mL (100 mLs Intravenous Contrast Given 01/28/19 1817)     Initial Impression / Assessment and Plan / ED Course  I have reviewed the triage vital signs and the nursing notes.  Pertinent labs & imaging results that were available during my care of the patient were reviewed by me and considered in my medical decision making (see chart for details).        Patient CT scan shows suspected metastatic vulvar cancer.  I spoke with the GYN doctor on call and he stated the patient should be transferred to [facility] oncology.  I spoke with the patient and her family member and the patient refused to be admitted anywhere.  She stated she wanted to go back to her home.  She was not going to be admitted.  Her power of attorney wanted to know about a GYN oncologist at [facility] because she felt like she may be able to convince her to go there and be seen in the office.  So I gave her the phone number for the GYN oncology at [facility] and the patient left AMA.  Final Clinical Impressions(s) / ED Diagnoses   Final diagnoses:  Vaginal bleeding    ED Discharge Orders    None       Milton Ferguson, MD 01/28/19 2043

## 2019-01-28 NOTE — ED Notes (Signed)
Back from CT

## 2019-01-28 NOTE — Discharge Instructions (Addendum)
Call [place] oncology and set up an appointment.  Phone number is [phone].  You can tell them that Ms. Shor had a CAT scan in the emergency department here that showed suspected metastatic vulvar cancer.  You can also tell them that she refused to be admitted to the hospital the day she was seen.

## 2019-01-29 ENCOUNTER — Telehealth: Payer: Self-pay | Admitting: *Deleted

## 2019-01-29 NOTE — Telephone Encounter (Signed)
Patient's niece Lauro Regulus  called "I need an appt for Corrisa, she was seen at the ER last night. We were told she has metastatic vulvar cancer and needs care last night. She was to be admitted to [facility] but she refuse to go. She is having some bleeding and a lot of pain. The assisted living she is in, they are asking; almost demanding a care plan/action now. They don't have a nurse there 24/7."   Lauro Regulus was offered an appt here in the office on Friday at 10:15am, she stated she can't wait that long. Lauro Regulus stated "I did call [facility] and they have an appt on Thursday at 10:30am, but I can't wait that long either. The assisted living wants something done now. They even ask me to take her home with me. I can't do that, I have myself to take care of. She can't go back to the ER and sit for hours waiting to seen in back. I am a dialysis patient and can't be go and sit with her in the ER. I guess I will call EMS and see what they can do."   Explained that we will keep her records just in case she needs to come here in the future.

## 2019-03-20 ENCOUNTER — Encounter (HOSPITAL_COMMUNITY): Payer: Medicare Other

## 2019-03-22 DEATH — deceased

## 2019-04-16 ENCOUNTER — Other Ambulatory Visit (HOSPITAL_COMMUNITY): Payer: Medicare Other

## 2019-04-22 ENCOUNTER — Ambulatory Visit (HOSPITAL_COMMUNITY): Payer: Medicare Other | Admitting: Hematology

## 2019-11-30 IMAGING — US US EXTREM LOW VENOUS*R*
1 series · 13 of 24 positions shown · non-contrast
Comparison: None.

CLINICAL DATA: Intermittent right lower extremity pain and edema
for the past week.



[Series 1: us extrem low venous*right* · 0.09mm/px · 13 of 64 slices shown]
[im 1/64]
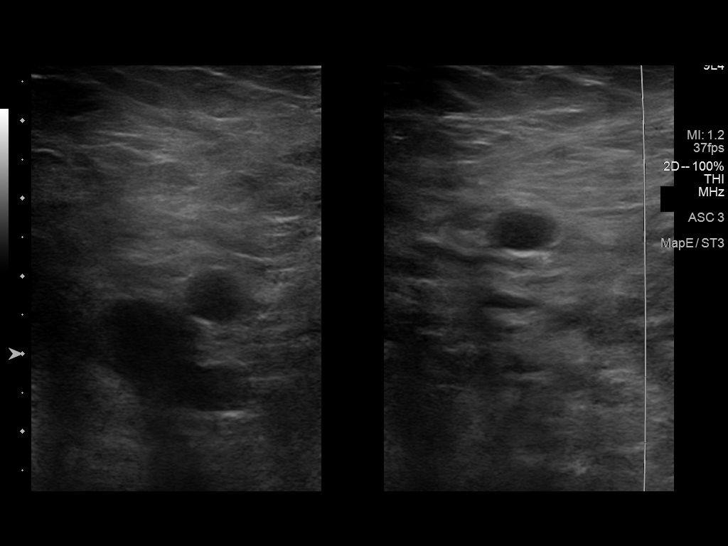
[im 6/64]
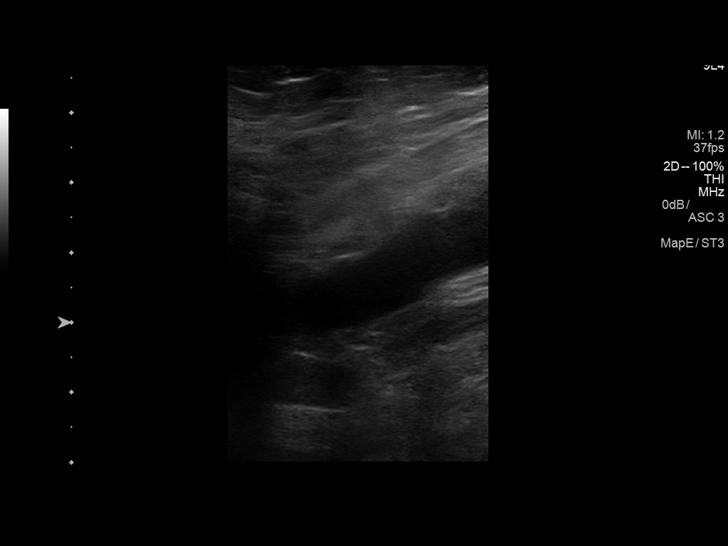
[im 11/64]
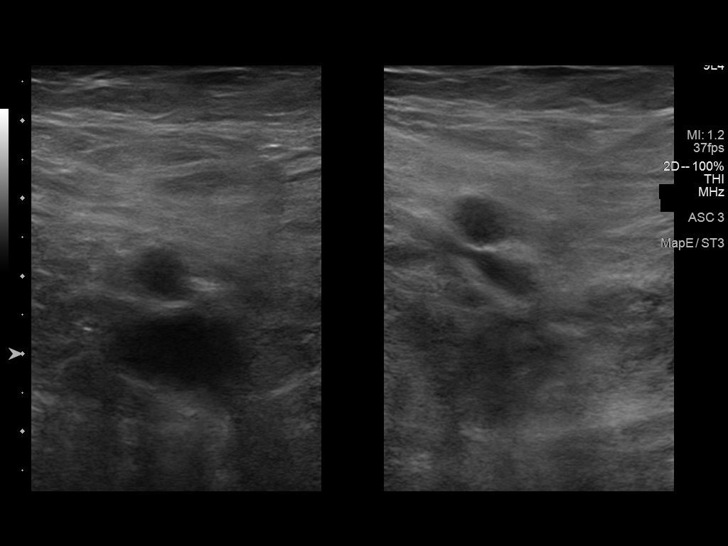
[im 17/64]
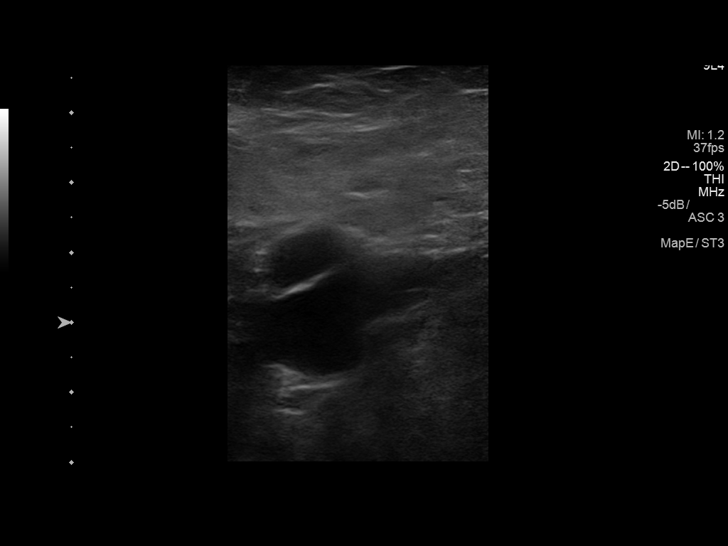
[im 22/64]
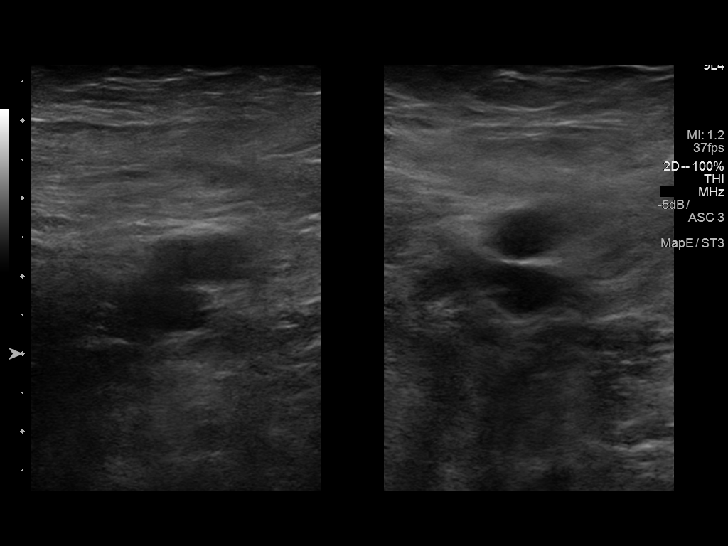
[im 28/64]
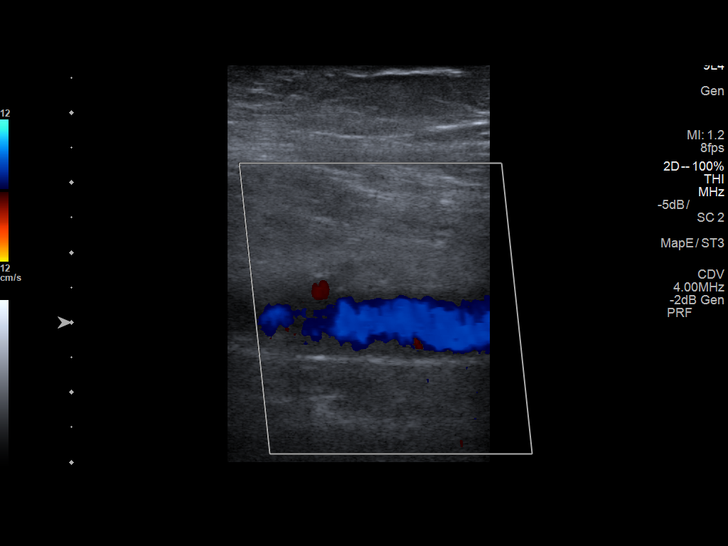
[im 33/64]
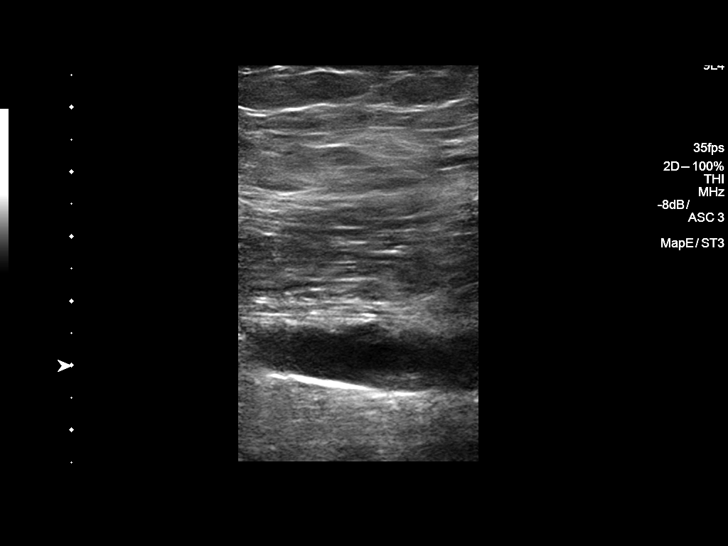
[im 36/64]
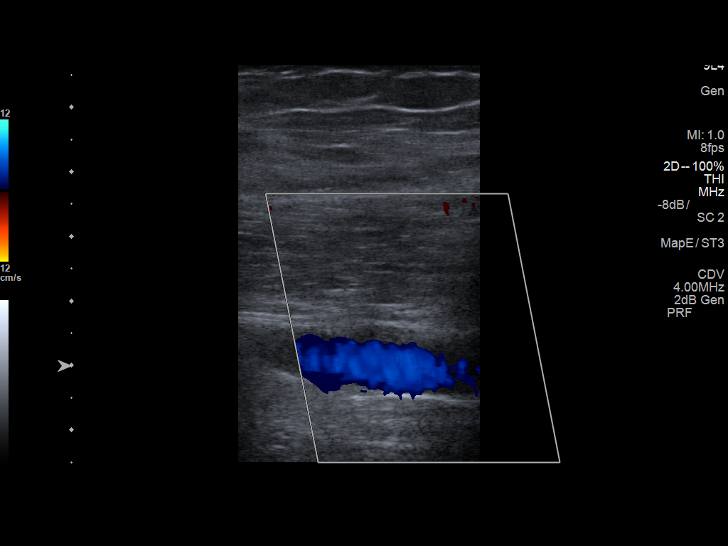
[im 42/64]
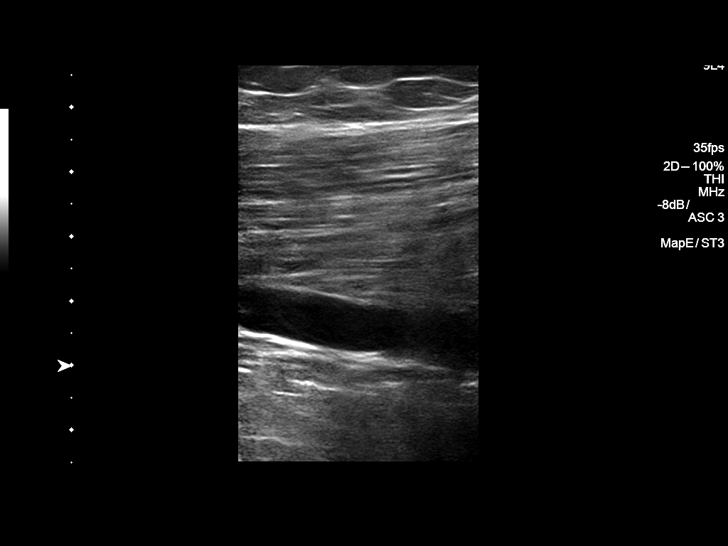
[im 47/64]
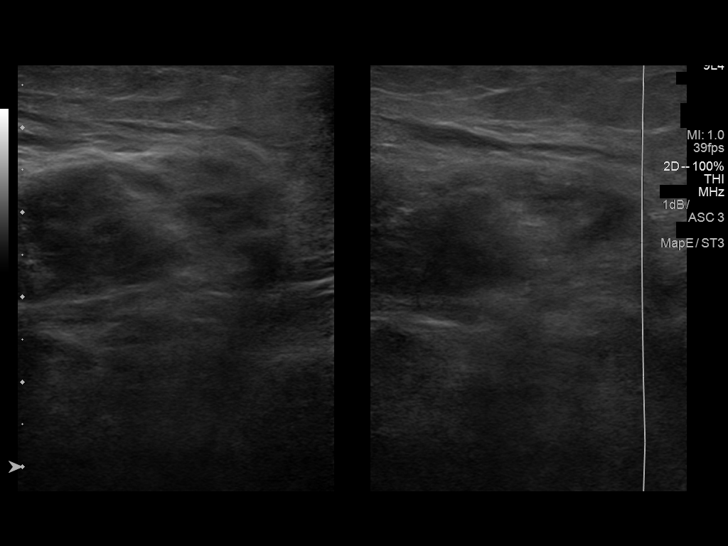
[im 53/64]
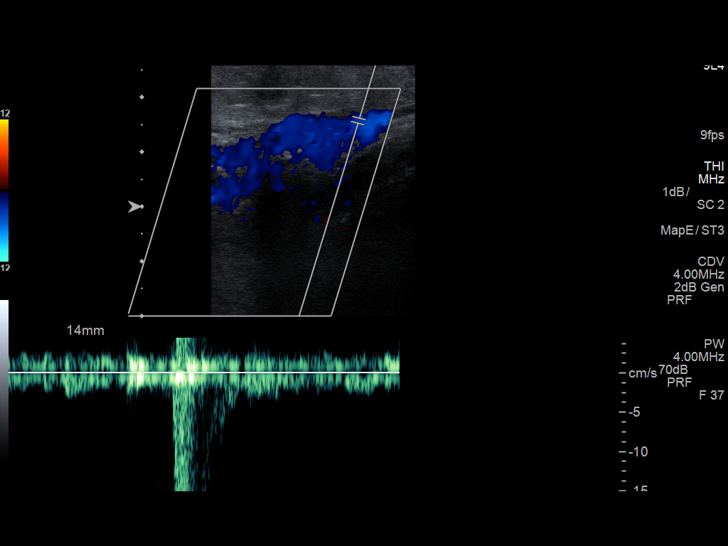
[im 58/64]
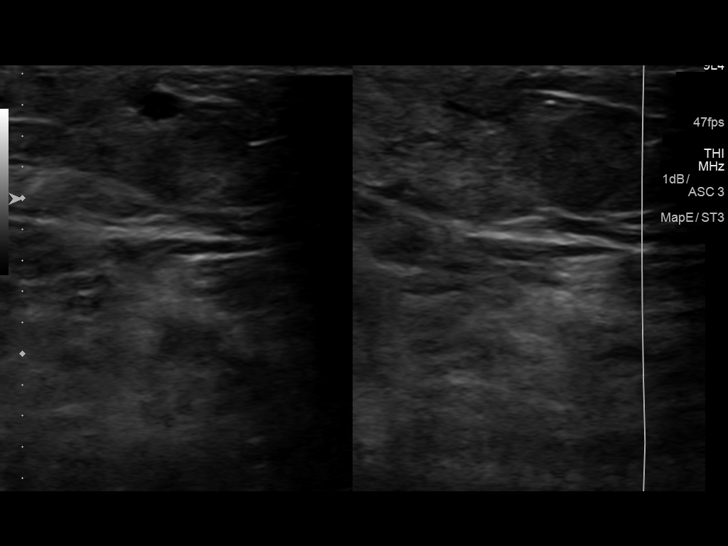
[im 64/64]
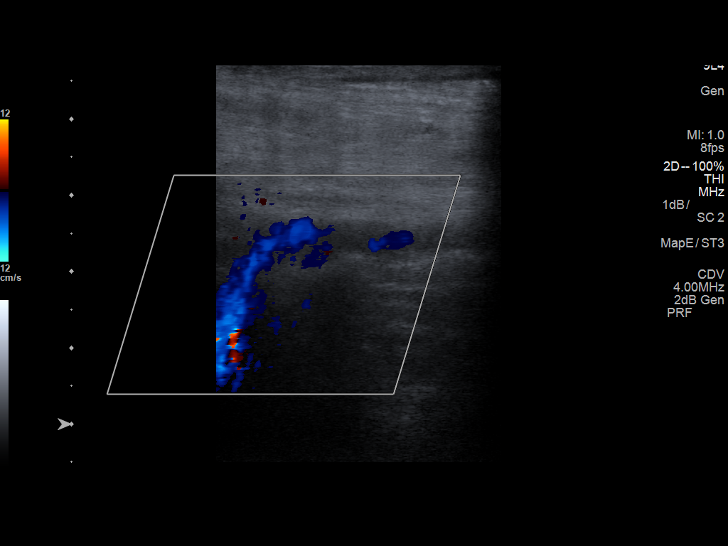

[13 of 24 positions shown; findings below may reference images not displayed]

FINDINGS: Contralateral Common Femoral Vein: Respiratory phasicity is normal
and symmetric with the symptomatic side. No evidence of thrombus.
Normal compressibility.

Common Femoral Vein: No evidence of thrombus. Normal
compressibility, respiratory phasicity and response to augmentation.

Saphenofemoral Junction: No evidence of thrombus. Normal
compressibility and flow on color Doppler imaging.

Profunda Femoral Vein: No evidence of thrombus. Normal
compressibility and flow on color Doppler imaging.

Femoral Vein: No evidence of thrombus. Normal compressibility,
respiratory phasicity and response to augmentation.

Popliteal Vein: No evidence of thrombus. Normal compressibility,
respiratory phasicity and response to augmentation.

Calf Veins: No evidence of thrombus. Normal compressibility and flow
on color Doppler imaging.

Superficial Great Saphenous Vein: No evidence of thrombus. Normal
compressibility.

Venous Reflux:  None.

Other Findings:  None.
IMPRESSION: No evidence of DVT within the right lower extremity.
